# Patient Record
Sex: Female | Born: 1940 | Race: Black or African American | Hispanic: No | Smoking: Current some day smoker
Health system: Southern US, Community
[De-identification: ages and names within clinical notes are randomized; demographics above are authoritative.]

## PROBLEM LIST (undated history)

## (undated) DIAGNOSIS — R918 Other nonspecific abnormal finding of lung field: Secondary | ICD-10-CM

## (undated) DIAGNOSIS — M8700 Idiopathic aseptic necrosis of unspecified bone: Secondary | ICD-10-CM

## (undated) DIAGNOSIS — F419 Anxiety disorder, unspecified: Secondary | ICD-10-CM

## (undated) DIAGNOSIS — T7840XA Allergy, unspecified, initial encounter: Secondary | ICD-10-CM

## (undated) DIAGNOSIS — J449 Chronic obstructive pulmonary disease, unspecified: Secondary | ICD-10-CM

## (undated) DIAGNOSIS — K3189 Other diseases of stomach and duodenum: Secondary | ICD-10-CM

## (undated) DIAGNOSIS — E039 Hypothyroidism, unspecified: Secondary | ICD-10-CM

## (undated) DIAGNOSIS — S065X9A Traumatic subdural hemorrhage with loss of consciousness of unspecified duration, initial encounter: Secondary | ICD-10-CM

## (undated) DIAGNOSIS — J189 Pneumonia, unspecified organism: Secondary | ICD-10-CM

## (undated) DIAGNOSIS — M1990 Unspecified osteoarthritis, unspecified site: Secondary | ICD-10-CM

## (undated) DIAGNOSIS — I4892 Unspecified atrial flutter: Secondary | ICD-10-CM

## (undated) DIAGNOSIS — D649 Anemia, unspecified: Secondary | ICD-10-CM

## (undated) DIAGNOSIS — E785 Hyperlipidemia, unspecified: Secondary | ICD-10-CM

## (undated) DIAGNOSIS — K635 Polyp of colon: Secondary | ICD-10-CM

## (undated) HISTORY — PX: COLONOSCOPY: SHX174

## (undated) HISTORY — DX: Idiopathic aseptic necrosis of unspecified bone: M87.00

## (undated) HISTORY — DX: Allergy, unspecified, initial encounter: T78.40XA

## (undated) HISTORY — DX: Hyperlipidemia, unspecified: E78.5

## (undated) HISTORY — DX: Chronic obstructive pulmonary disease, unspecified: J44.9

## (undated) HISTORY — DX: Polyp of colon: K63.5

## (undated) HISTORY — DX: Anemia, unspecified: D64.9

## (undated) HISTORY — PX: DILATION AND CURETTAGE OF UTERUS: SHX78

## (undated) HISTORY — DX: Hypothyroidism, unspecified: E03.9

## (undated) HISTORY — PX: TUBAL LIGATION: SHX77

## (undated) HISTORY — DX: Anxiety disorder, unspecified: F41.9

---

## 1998-11-12 ENCOUNTER — Emergency Department (HOSPITAL_COMMUNITY): Admission: EM | Admit: 1998-11-12 | Discharge: 1998-11-12 | Payer: Self-pay | Admitting: Emergency Medicine

## 2000-03-06 HISTORY — PX: TOTAL HIP ARTHROPLASTY: SHX124

## 2000-10-05 ENCOUNTER — Encounter: Payer: Self-pay | Admitting: Orthopedic Surgery

## 2000-10-09 ENCOUNTER — Encounter: Payer: Self-pay | Admitting: Orthopedic Surgery

## 2000-10-09 ENCOUNTER — Inpatient Hospital Stay (HOSPITAL_COMMUNITY): Admission: RE | Admit: 2000-10-09 | Discharge: 2000-10-15 | Payer: Self-pay | Admitting: Orthopedic Surgery

## 2000-10-11 ENCOUNTER — Encounter: Payer: Self-pay | Admitting: Orthopedic Surgery

## 2003-08-26 ENCOUNTER — Encounter (HOSPITAL_COMMUNITY): Admission: RE | Admit: 2003-08-26 | Discharge: 2003-08-27 | Payer: Self-pay | Admitting: Family Medicine

## 2005-01-06 ENCOUNTER — Encounter: Admission: RE | Admit: 2005-01-06 | Discharge: 2005-01-06 | Payer: Self-pay | Admitting: Family Medicine

## 2005-01-17 ENCOUNTER — Ambulatory Visit (HOSPITAL_COMMUNITY): Admission: RE | Admit: 2005-01-17 | Discharge: 2005-01-17 | Payer: Self-pay | Admitting: Gastroenterology

## 2005-01-17 ENCOUNTER — Encounter (INDEPENDENT_AMBULATORY_CARE_PROVIDER_SITE_OTHER): Payer: Self-pay | Admitting: *Deleted

## 2005-01-20 ENCOUNTER — Encounter: Admission: RE | Admit: 2005-01-20 | Discharge: 2005-01-20 | Payer: Self-pay | Admitting: Family Medicine

## 2007-01-07 ENCOUNTER — Encounter: Admission: RE | Admit: 2007-01-07 | Discharge: 2007-01-07 | Payer: Self-pay | Admitting: Family Medicine

## 2010-02-10 ENCOUNTER — Ambulatory Visit: Payer: Self-pay | Admitting: Cardiology

## 2010-03-27 ENCOUNTER — Encounter: Payer: Self-pay | Admitting: Family Medicine

## 2010-07-22 NOTE — Discharge Summary (Signed)
[place]. [facility]  Patient:    Emma Douglas, Emma Douglas Visit Number: [id number] MRN: [id number]          Service Type: SUR Location: [id number] Attending Physician:  Marlowe Kays Page Dictated by:   Alexzandrew L. Perkins, P.A.-C. Adm. Date:  10/09/2000 Disc. Date: 10/15/2000                             Discharge Summary  ADMISSION DIAGNOSES: 1. Avascular necrosis right femoral head. 2. Hypothyroidism. 3. Anxiety. 4. Hyperlipidemia. 5. Chronic obstructive pulmonary disease. 6. Smoking history.  DISCHARGE DIAGNOSES: 1. Avascular necrosis with collapsed right femoral head, status post right    total hip replacement arthroplasty. 2. Postoperative hemorrhagic anemia. 3. Status post transfusion without sequelae. 4. Hypothyroidism. 5. Anxiety. 6. Hyperlipidemia. 7. Chronic obstructive pulmonary disease. 8. Smoking history.  PROCEDURES:  The patient was taken to the OR on October 09, 2000, and underwent a right total hip replacement arthroplasty.  Surgeon:  Dr. Marlowe Kays. Assistant:  Dr. Ronnell Guadalajara.  Anesthesia:  General.  CONSULTATIONS:  Rehabilitation services, Dr. Johna Roles.  HISTORY OF PRESENT ILLNESS:  The patient is a 70 year old female who has been having increasing right hip pain for some time now.  The pain has been increasing over the past five years and has started to interfere with her daily activities.  She has been referred to Dr. Dawayne Cirri office by Dr. Alcario Drought of [facility] for consideration of surgery.  The patient was seen and evaluated and found to have avascular necrosis of the right femoral head.  It was felt she would benefit from undergoing total hip replacement.  Risks and benefits discussed.  She was subsequently admitted to the hospital.  LABORATORY DATA:  CBC on admission:  Hemoglobin 11.3, hematocrit 33.6, white cell count 12.2, red cell count 3.59.  Differential:  Neutrophils 57,  lymphs 40, monos 1, eos 0, basos 0.  Serial H&Hs were followed throughout the hospital course.  Postoperative hemoglobin down to 10.4, hematocrit of 30.4. Continued to decline.  Hemoglobin crept down to a level of 7.8.  Transfused two units.  Posttransfusion hemoglobin 10.2, hematocrit of 29.9.  PT and PTT on admission were 12.8 and 35 respectively.  INR of 1.0.  Serial protimes followed per Coumadin protocol.  Last noted PT/INR of 20.7 and 2.2 respectively.  Chemistry panel on admission:  Slightly elevated ALP of 118, remaining chemistry panel all within normal limits.  Urinalysis on admission showed many epithelial cells with few bacteria and 7-10 white cells and 0-10 red cells.  Follow-up UA showed only few epithelial cells and few bacteria with 7-10 white cells and 36 red cells.  Blood group type O positive.  EKG dated October 05, 2000:  Normal sinus rhythm.  Nonspecific T-wave abnormalities.  No old tracings.  Confirmed by Dr. Viann Fish, Montez Hageman.  X-rays dated October 09, 2000:  Satisfactory appearance following right total hip replacement.  HOSPITAL COURSE:  The patient was admitted to [facility] and taken to the OR.  She underwent the above-stated procedure without complication.  The patient tolerated the procedure well.  Later was sent to the recovery room and then to the orthopedic floor for continued postoperative care.  The patient was placed on PCA analgesia for pain control following surgery.  She did have elevated temperatures postoperatively and was noted to be 101.1 on postoperative day #1.  She was treated with antibiotics, incentive  spirometer. She had a history of COPD, and incentive spirometer was encouraged.  She did have some productive cough noted on postoperative day #2.  A chest x-ray was ordered, which was recorded as negative (I did not see a chest x-ray report on the chart).  She continued to be followed postoperatively.  Her hemoglobin was watched very  closely.  It was noted to have dropped postoperatively down to a level of 7.8.  She was transfused with two units of blood and also underwent some mild diuresis.  She tolerated the blood very well.  Hemoglobin improved up to 10.2 by postoperative day #4.  She was seen and evaluated by rehabilitation services postoperatively, and it was felt she would benefit from undergoing inpatient rehabilitation stay.  Throughout the course she did slowly progress with physical therapy and, after further discussion with Dr. Simonne Come, the patient decided that she would rather go home.  She continued to work with therapy up until postoperative day #6, where she was doing quite well, ambulating more independently, and she was ready to go home. The patient was, therefore, discharged on October 15, 2000.  DISCHARGE PLAN: 1. Patient discharged home on October 15, 2000. 2. Discharge diagnoses:  Same as above. 3. Discharge medications:    a. Coumadin as per outpatient protocol.    b. Percocet p.r.n. pain.    c. Robaxin p.r.n. spasm. 4. Diet:  As tolerated. 5. Activity:  Touchdown weightbearing to the operative leg.  Hip precautions    at all times. 6. Follow-up:  Two weeks from the date of surgery with Dr. Simonne Come.  CONDITION ON DISCHARGE:  Improved. Dictated by:   Alexzandrew L. Perkins, P.A.-C. Attending Physician:  Joaquin Courts DD:  10/26/00 TD:  10/28/00 Job: [id number]

## 2010-07-22 NOTE — H&P (Signed)
[place]. [facility]  Patient:    Emma Douglas, Emma Douglas                     MRN: [id number] Adm. Date:  10/09/00 Attending:  Fayrene Fearing P. Aplington, M.D. Dictator:   Colleen P. Mahar, P.A.                         History and Physical  CHIEF COMPLAINT: Right hip pain.  HISTORY OF PRESENT ILLNESS: The patient is having increasing right hip pain and groin pain.  She is having increasing problems over the last five years, recently increasing and interfering with her activities of daily living and quality of life.  She has been referred to our clinic from Dr. Elvina Sidle at [facility].  The risks and benefits of surgery were discussed with this patient by Dr. Simonne Come as well as myself and she decided to proceed with a right total hip replacement.  PAST MEDICAL HISTORY:  1. Hypothyroidism.  2. Anxiety.  3. Hyperlipidemia, secondary to hypothyroidism and resolving.  4. COPD secondary to smoking.  MEDICATIONS:  1. Synthroid 100 mcg one q.d.  2. Bextra 10 mg one q.d.  ALLERGIES: No known drug allergies.  PAST SURGICAL HISTORY: Ankle fracture in 1996 with open reduction and internal fixation.  SOCIAL HISTORY: The patient smokes approximately 1/2 packs of cigarettes per day.  She denies any alcohol use.  She is divorced and lives alone.  FAMILY HISTORY: Mother deceased in her 33s secondary to an MI.  Father deceased in his 64s secondary to an MI.  One siblings deceased in his 24s secondary to lung cancer.  REVIEW OF SYSTEMS: GENERAL: The patient denies any fever, chills, night sweats, or bleeding tendencies.  CNS: Denies blurred vision, double vision, headaches, seizures, or paralysis.  PULMONARY: Denies shortness of breath, productive cough, or hemoptysis.  CARDIOVASCULAR: Denies chest pain, angina, orthopnea, or claudication.  GI: Denies nausea, vomiting, constipation, diarrhea, melena, or bloody stool.  GU: Denies dysuria, hematuria,  or discharge.  MUSCULOSKELETAL: As per HPI.  PHYSICAL EXAMINATION:  VITAL SIGNS: Blood pressure 130/66, respirations 14 and unlabored, pulse 80 and regular.  GENERAL APPEARANCE: The patient is a 70 year old female who is alert and oriented and in no acute distress.  She is pleasant and cooperative throughout the examination.  HEENT: Head normocephalic, atraumatic.  EOMI.  NECK: Soft and supple to palpation.  No thyromegaly noted.  No bruits appreciated.  CHEST: Clear to auscultation bilaterally anterior and posterior.  No rales, rhonchi, wheezes, stridor, or friction rub appreciated.  BREAST: Not pertinent, not performed.  HEART: Regular rate and rhythm.  There was a murmur appreciated, 1-2/6 holosystolic murmur.  No gallops or rubs appreciated.  The patient denied any known history of murmur.  ABDOMEN: Soft and supple to palpation.  Positive bowel sounds throughout. Nontender, nondistended.  No organomegaly noted.  GU: Not pertinent, not performed.  EXTREMITIES: She walks with a pronounced limp.  She complains of significant groin and hip pain.  Motion is limited secondary to pain.  Strength, sensation, and circulation are intact to bilateral lower extremities.  Knee and ankle reflexes are 1+ bilaterally and equal.  SKIN: Intact without any rashes or lesions.  LABORATORY DATA: X-rays reveal collapse of the right femoral head with findings consistent with avascular necrosis.  IMPRESSION:  1. Avascular necrosis of the right femoral head.  2. Hypothyroidism.  3. Anxiety.  4. Chronic obstructive pulmonary disease  secondary to smoking.  PLAN: Admit to [facility] [facility] for right total hip replacement by Dr. Marlowe Kays on October 09, 2000.  The patients primary medical physician is Dr. Milus Glazier at [facility] and he had been contacted for work-up of the heart murmur to confirm that it had been previously noted.  He wanted to see the  patient prior to clearing her for surgery and she was seen in his office on October 04, 2000, at which time he wanted to further work up this murmur prior to surgery.  She is currently scheduled for an echocardiogram and medical clearance will be pending the results of that. DD:  10/04/00 TD:  10/05/00 Job: [id number]

## 2010-07-22 NOTE — Op Note (Signed)
[place]. [facility]  Patient:    Emma Douglas, Emma Douglas                   MRN: [id number] Proc. Date: 10/09/00 Adm. Date:  [id number] Attending:  Marlowe Kays Page                           Operative Report  PREOPERATIVE DIAGNOSIS:  Avascular necrosis with collapse right femoral head.  POSTOPERATIVE DIAGNOSIS:  Avascular necrosis with collapse right femoral head.  OPERATION:  Osteonics total hip arthroplasty right.  SURGEON:  Illene Labrador. Aplington, M.D.  ASSISTANT:  Philips J. Montez Morita, M.D.  ANESTHESIA:  General  INDICATION FOR PROCEDURE:  As stated in diagnosis she was having significant amount of pain.  DESCRIPTION OF PROCEDURE:  Prophylactic antibiotics.  Satisfactory general anesthesia.  Foley catheter inserted.  Left lateral decubitus position using the Mark II frame. The right hip was prepped with DuraPrep and draped in a sterile field.  Ioban employed. Posterior lateral incision down to the fascia lata.  Zickels band was cut.  External rotators were detached from the femur with cutting cautery. The gluteus medius was retracted and the piriformis cut, tagged with a #1 Ethibon and retracted.  The hip capsule was identified and opened in T fashion and the flaps retracted with #1 Ethibon.  The hip was then dislocated and the piriformis fossa were cleared of soft tissue.  I made my initial femoral neck cut just below the femoral head and placed a guide pin down the piriformis fossa and femur drilling over it with step cut drill, followed by the canal finder.  I then began the cylinder reaming process initially up to a 9.  Along the way used the greater trochanteric reamer and also made my final cut on the femoral neck a fingerbreadth above the lesser trochanter using the guide.  At #9 which is what we templated, then began the rasping process but found that the 10 rasp was probably going to be the appropriate size and accordingly I went back to a #9  and did a little bit more reaming with cylindrical reamer followed by a 10 and then went back to the 10 rasp which indeed did seem to be the ideal fit for this woman.  At age 79 I elected to go with the noncemented femoral component.  I then reamed from the distal femoral tip up to 14-1/2 to accept a 14 mm tip.  I then went to the acetabulum where I removed the labrum and cleared the fossa of soft tissue.  I then introduced 4-3 reamer to level off the base of the acetabulum and then began with expanding reamers working finally up to a 52 which had good peripheral fit.  I then went through a trial reduction and the hip was found to be nice and stable with the composition as I placed it.  Accordingly went ahead and placed a final Omni fit PSL microstructured acetabular shell tightly impacting it and then placing two fixation screws with each one being checked with a depth gauge measured and started.  Then went through another trial reduction and the hip seemed to be nicely stable with a 10 degree liner at roughly 12:00.  Accordingly went ahead and placed the final polyethylene liner and went back to the femur where I irrigated well with antibiotic solution and placed the final primary secure fit plus hip #10 with 14 mm tip impacting  it gently.  I then went through a trial reduction once again with +5 neck being a little long and +0 seemed to be the ideal fit with excellent stability. Accordingly I placed the +0 C tapered head, reduced the hip and found to be nicely stable.  The wound was irrigated with sterile saline.  Zickels band was reconstituted with interrupted #1 Vicryl as was the capsule.  External rotators were reattached with #1 Vicryl as well and piriformis with #1 Ethibon.  Fascia lata was then reapproximated using interrupted #1 Vicryl and the subcutaneous tissue with a combination of #1 deep and 2-0 Vicryl superficially with staples in the skin.  Betadine Adaptic dry sterile  dressing were applied.  She was gently placed on her back in an abduction pillow.  Leg lengths were roughly equal and the toes were up. She was taken to the recovery room in satisfactory condition with no known complications.  Estimated blood loss was approximately 500 cc.  No blood replacement. DD:  10/09/00 TD:  10/09/00 Job: [id number]

## 2010-07-22 NOTE — Op Note (Signed)
NAME:  Emma Douglas, Emma Douglas NO.:  [id number]   MEDICAL RECORD NO.:  [id number]          PATIENT TYPE:  AMB   LOCATION:  ENDO                         FACILITY:  [facility]   PHYSICIAN:  Graylin Shiver, M.D.   DATE OF BIRTH:  [birth year]   DATE OF PROCEDURE:  01/17/2005  DATE OF DISCHARGE:                                 OPERATIVE REPORT   PROCEDURE:  Colonoscopy with polypectomy and biopsy.   INDICATIONS FOR PROCEDURE:  Screening.   Informed consent was obtained after explanation of the risks of bleeding,  infection and perforation.   PREMEDICATION:  Fentanyl 62.5 mcg IV, Versed 6 mg IV.   DESCRIPTION OF PROCEDURE:  With the patient in the left lateral decubitus  position, a rectal exam was performed, no masses were felt. The Olympus  colonoscope was inserted into the rectum and advanced around a tortuous  colon to the cecum. Upon advancement of the scope, I did see a 5 mm polyp in  the descending colon which was sessile and removed by snare cautery  technique. Also upon advancement, there was a 3 mm polyp in the transverse  colon which was biopsied off with cold forceps. In the cecum, the ileocecal  valve and appendiceal orifice area appeared normal, the ascending colon  looked normal. There were no other abnormalities noted in the transverse  colon other than the previously removed polyp. There were no other  abnormalities noted in the descending colon other than the previously  removed polyp. The sigmoid looked normal. In the rectum, there was a 4 mm  sessile polyp removed by snare cautery technique. The patient tolerated the  procedure well without complications.   IMPRESSION:  Colon polyps as described above.   PLAN:  The pathology will be checked.           ______________________________  Graylin Shiver, M.D.     SFG/MEDQ  D:  01/17/2005  T:  01/17/2005  Job:  [id number]

## 2010-08-31 ENCOUNTER — Encounter: Payer: Self-pay | Admitting: Cardiology

## 2010-09-05 ENCOUNTER — Other Ambulatory Visit: Payer: Self-pay | Admitting: *Deleted

## 2010-09-05 DIAGNOSIS — E039 Hypothyroidism, unspecified: Secondary | ICD-10-CM

## 2010-09-05 MED ORDER — LEVOTHYROXINE SODIUM 100 MCG PO TABS: 100.0000 ug | ORAL_TABLET | Freq: Every day | ORAL | Status: DC

## 2010-09-05 NOTE — Telephone Encounter (Signed)
Refilled meds per fax request.  

## 2010-09-26 ENCOUNTER — Other Ambulatory Visit: Payer: Self-pay | Admitting: Cardiology

## 2010-09-26 DIAGNOSIS — E785 Hyperlipidemia, unspecified: Secondary | ICD-10-CM

## 2010-09-27 NOTE — Telephone Encounter (Signed)
escribe request  

## 2010-11-23 ENCOUNTER — Other Ambulatory Visit: Payer: Self-pay | Admitting: Cardiology

## 2010-11-25 ENCOUNTER — Other Ambulatory Visit: Payer: Self-pay | Admitting: *Deleted

## 2010-11-25 DIAGNOSIS — E785 Hyperlipidemia, unspecified: Secondary | ICD-10-CM

## 2010-11-25 MED ORDER — SIMVASTATIN 40 MG PO TABS: 40.0000 mg | ORAL_TABLET | Freq: Every day | ORAL | Status: DC

## 2010-11-25 NOTE — Telephone Encounter (Signed)
Fax received from pharmacy. Refill completed. Jodette Mazel Villela RN  

## 2010-12-14 ENCOUNTER — Ambulatory Visit: Payer: Self-pay | Admitting: Cardiology

## 2011-01-13 ENCOUNTER — Encounter: Payer: Self-pay | Admitting: Cardiology

## 2011-01-13 ENCOUNTER — Other Ambulatory Visit: Payer: Self-pay | Admitting: *Deleted

## 2011-01-13 ENCOUNTER — Ambulatory Visit (INDEPENDENT_AMBULATORY_CARE_PROVIDER_SITE_OTHER): Payer: Medicaid Other | Admitting: Cardiology

## 2011-01-13 VITALS — BP 132/74 | HR 68 | Ht 68.0 in | Wt 146.1 lb

## 2011-01-13 DIAGNOSIS — I359 Nonrheumatic aortic valve disorder, unspecified: Secondary | ICD-10-CM

## 2011-01-13 DIAGNOSIS — I119 Hypertensive heart disease without heart failure: Secondary | ICD-10-CM

## 2011-01-13 DIAGNOSIS — I358 Other nonrheumatic aortic valve disorders: Secondary | ICD-10-CM

## 2011-01-13 DIAGNOSIS — E7800 Pure hypercholesterolemia, unspecified: Secondary | ICD-10-CM

## 2011-01-13 DIAGNOSIS — E039 Hypothyroidism, unspecified: Secondary | ICD-10-CM

## 2011-01-13 NOTE — Progress Notes (Signed)
Emma Douglas Date of Birth:  1940/11/16 [facility] [address]   [address] [place], [place]  [address] [phone]           Fax   [phone]  HPI: This pleasant 70 year old woman is seen for a scheduled followup office visit.  She has a past history of hypercholesterolemia essential hypertension and hypothyroidism.  She has a remote history of a large pericardial effusion which occurred when she was markedly hypothyroid.  The pericardial effusion resolved when her thyroid status returned to normal.  Her last echocardiogram was 08/10/09 and showed normal systolic function with ejection fraction 55-60% and with impaired relaxation.  She has mild aortic sclerosis and mild mitral regurgitation and normal pulmonary artery pressure.  Current Outpatient Prescriptions  Medication Sig Dispense Refill  . levothyroxine (SYNTHROID) 100 MCG tablet Take 1 tablet (100 mcg total) by mouth daily.  30 tablet  5  . lisinopril-hydrochlorothiazide (PRINZIDE,ZESTORETIC) 20-12.5 MG per tablet Take 1 tablet by mouth daily.        . simvastatin (ZOCOR) 40 MG tablet Take 1 tablet (40 mg total) by mouth at bedtime.  30 tablet  1    No Known Allergies  Patient Active Problem List  Diagnoses  . Benign hypertensive heart disease without heart failure    History  Smoking status  . Former Smoker  Smokeless tobacco  . Not on file    History  Alcohol Use     Family History  Problem Relation Age of Onset  . Heart attack Mother   . Heart attack Father     Review of Systems: The patient denies any heat or cold intolerance.  No weight gain or weight loss.  The patient denies headaches or blurry vision.  There is no cough or sputum production.  The patient denies dizziness.  There is no hematuria or hematochezia.  The patient denies any muscle aches or arthritis.  The patient denies any rash.  The patient denies frequent falling or instability.  There is no history of  depression or anxiety.  All other systems were reviewed and are negative.   Physical Exam: Filed Vitals:   01/13/11 1510  BP: 132/74  Pulse: 68      Assessment / Plan:

## 2011-01-13 NOTE — Patient Instructions (Addendum)
Your physician recommends that you continue on your current medications as directed. Please refer to the Current Medication list given to you today.  Your physician wants you to follow-up in: 6 months. You will receive a reminder letter in the mail two months in advance. If you don't receive a letter, please call our office to schedule the follow-up appointment.  

## 2011-01-13 NOTE — Assessment & Plan Note (Signed)
The patient is clinically euthyroid on her current therapy

## 2011-01-13 NOTE — Assessment & Plan Note (Signed)
The patient is tolerating her current antihypertensive regimen.  No side effects from her ACE inhibitor.

## 2011-01-13 NOTE — Assessment & Plan Note (Signed)
The patient is on simvastatin.  She is not having any side effects from statin therapy.  Plan to check her lipids at her next visit.

## 2011-01-18 ENCOUNTER — Other Ambulatory Visit: Payer: Self-pay | Admitting: Gastroenterology

## 2011-02-10 ENCOUNTER — Other Ambulatory Visit: Payer: Self-pay | Admitting: Cardiology

## 2011-02-10 NOTE — Telephone Encounter (Signed)
Refilled simvastatin.

## 2011-03-06 ENCOUNTER — Other Ambulatory Visit: Payer: Self-pay | Admitting: Cardiology

## 2011-03-06 MED ORDER — SIMVASTATIN 40 MG PO TABS: 40.0000 mg | ORAL_TABLET | Freq: Every day | ORAL | Status: DC

## 2011-03-07 DIAGNOSIS — S065XAA Traumatic subdural hemorrhage with loss of consciousness status unknown, initial encounter: Secondary | ICD-10-CM

## 2011-03-07 DIAGNOSIS — S065X9A Traumatic subdural hemorrhage with loss of consciousness of unspecified duration, initial encounter: Secondary | ICD-10-CM

## 2011-03-07 HISTORY — DX: Traumatic subdural hemorrhage with loss of consciousness status unknown, initial encounter: S06.5XAA

## 2011-03-07 HISTORY — DX: Traumatic subdural hemorrhage with loss of consciousness of unspecified duration, initial encounter: S06.5X9A

## 2011-03-10 ENCOUNTER — Other Ambulatory Visit: Payer: Self-pay | Admitting: *Deleted

## 2011-03-10 DIAGNOSIS — E039 Hypothyroidism, unspecified: Secondary | ICD-10-CM

## 2011-03-10 MED ORDER — LEVOTHYROXINE SODIUM 100 MCG PO TABS: 100.0000 ug | ORAL_TABLET | Freq: Every day | ORAL | Status: DC

## 2011-03-10 NOTE — Telephone Encounter (Signed)
Refilled synthroid

## 2011-06-05 ENCOUNTER — Other Ambulatory Visit: Payer: Self-pay | Admitting: Cardiology

## 2011-06-05 NOTE — Telephone Encounter (Signed)
..   Requested Prescriptions   Pending Prescriptions Disp Refills  . lisinopril-hydrochlorothiazide (PRINZIDE,ZESTORETIC) 20-12.5 MG per tablet [Pharmacy Med Name: LISINOPRIL-HCTZ 20-12.5 MG TAB] 30 tablet 3    Sig: TAKE 1 TABLET EVERY DAY

## 2011-09-04 ENCOUNTER — Other Ambulatory Visit: Payer: Self-pay | Admitting: Cardiology

## 2011-09-11 ENCOUNTER — Other Ambulatory Visit: Payer: Self-pay | Admitting: *Deleted

## 2011-09-11 MED ORDER — SIMVASTATIN 40 MG PO TABS: 40.0000 mg | ORAL_TABLET | Freq: Every day | ORAL | Status: DC

## 2011-09-11 MED ORDER — LEVOTHYROXINE SODIUM 100 MCG PO TABS: 100.0000 ug | ORAL_TABLET | Freq: Every day | ORAL | Status: DC

## 2011-09-11 MED ORDER — LISINOPRIL-HYDROCHLOROTHIAZIDE 20-12.5 MG PO TABS: 1.0000 | ORAL_TABLET | Freq: Every day | ORAL | Status: DC

## 2011-09-11 NOTE — Telephone Encounter (Signed)
Refills on synthroid,simvastatin and lisinopril

## 2011-10-28 ENCOUNTER — Encounter (HOSPITAL_COMMUNITY): Payer: Self-pay | Admitting: Physical Medicine and Rehabilitation

## 2011-10-28 ENCOUNTER — Emergency Department (HOSPITAL_COMMUNITY): Payer: Medicare Other

## 2011-10-28 ENCOUNTER — Inpatient Hospital Stay (HOSPITAL_COMMUNITY)
Admission: EM | Admit: 2011-10-28 | Discharge: 2011-11-01 | DRG: 085 | Disposition: A | Discharge status: Home / Self Care | Payer: Medicare Other | Attending: Internal Medicine | Admitting: Internal Medicine

## 2011-10-28 DIAGNOSIS — E039 Hypothyroidism, unspecified: Secondary | ICD-10-CM

## 2011-10-28 DIAGNOSIS — E46 Unspecified protein-calorie malnutrition: Secondary | ICD-10-CM | POA: Diagnosis present

## 2011-10-28 DIAGNOSIS — R578 Other shock: Secondary | ICD-10-CM | POA: Diagnosis present

## 2011-10-28 DIAGNOSIS — G9340 Encephalopathy, unspecified: Secondary | ICD-10-CM | POA: Diagnosis present

## 2011-10-28 DIAGNOSIS — I119 Hypertensive heart disease without heart failure: Secondary | ICD-10-CM

## 2011-10-28 DIAGNOSIS — J4489 Other specified chronic obstructive pulmonary disease: Secondary | ICD-10-CM | POA: Diagnosis present

## 2011-10-28 DIAGNOSIS — S065XAA Traumatic subdural hemorrhage with loss of consciousness status unknown, initial encounter: Secondary | ICD-10-CM

## 2011-10-28 DIAGNOSIS — E876 Hypokalemia: Secondary | ICD-10-CM

## 2011-10-28 DIAGNOSIS — I6203 Nontraumatic chronic subdural hemorrhage: Secondary | ICD-10-CM

## 2011-10-28 DIAGNOSIS — S065X9A Traumatic subdural hemorrhage with loss of consciousness of unspecified duration, initial encounter: Secondary | ICD-10-CM

## 2011-10-28 DIAGNOSIS — I6200 Nontraumatic subdural hemorrhage, unspecified: Secondary | ICD-10-CM

## 2011-10-28 DIAGNOSIS — E871 Hypo-osmolality and hyponatremia: Secondary | ICD-10-CM

## 2011-10-28 DIAGNOSIS — S065X0A Traumatic subdural hemorrhage without loss of consciousness, initial encounter: Principal | ICD-10-CM | POA: Diagnosis present

## 2011-10-28 DIAGNOSIS — J449 Chronic obstructive pulmonary disease, unspecified: Secondary | ICD-10-CM | POA: Diagnosis present

## 2011-10-28 DIAGNOSIS — Z720 Tobacco use: Secondary | ICD-10-CM

## 2011-10-28 DIAGNOSIS — N179 Acute kidney failure, unspecified: Secondary | ICD-10-CM

## 2011-10-28 DIAGNOSIS — I609 Nontraumatic subarachnoid hemorrhage, unspecified: Secondary | ICD-10-CM

## 2011-10-28 DIAGNOSIS — E860 Dehydration: Secondary | ICD-10-CM

## 2011-10-28 DIAGNOSIS — E7800 Pure hypercholesterolemia, unspecified: Secondary | ICD-10-CM

## 2011-10-28 DIAGNOSIS — D72829 Elevated white blood cell count, unspecified: Secondary | ICD-10-CM | POA: Diagnosis present

## 2011-10-28 DIAGNOSIS — N289 Disorder of kidney and ureter, unspecified: Secondary | ICD-10-CM

## 2011-10-28 DIAGNOSIS — I959 Hypotension, unspecified: Secondary | ICD-10-CM

## 2011-10-28 DIAGNOSIS — W19XXXA Unspecified fall, initial encounter: Secondary | ICD-10-CM | POA: Diagnosis present

## 2011-10-28 DIAGNOSIS — R9431 Abnormal electrocardiogram [ECG] [EKG]: Secondary | ICD-10-CM

## 2011-10-28 LAB — BASIC METABOLIC PANEL
BUN: 26 mg/dL — ABNORMAL HIGH (ref 6–23)
BUN: 23 mg/dL (ref 6–23)
CO2: 25 mEq/L (ref 19–32)
Calcium: 9.8 mg/dL (ref 8.4–10.5)
Chloride: 73 mEq/L — ABNORMAL LOW (ref 96–112)
Chloride: 77 mEq/L — ABNORMAL LOW (ref 96–112)
Creatinine, Ser: 1.39 mg/dL — ABNORMAL HIGH (ref 0.50–1.10)
GFR calc Af Amer: 43 mL/min — ABNORMAL LOW (ref >90)
GFR calc Af Amer: 52 mL/min — ABNORMAL LOW (ref >90)
GFR calc non Af Amer: 37 mL/min — ABNORMAL LOW (ref >90)
GFR calc non Af Amer: 45 mL/min — ABNORMAL LOW (ref >90)
Glucose, Bld: 112 mg/dL — ABNORMAL HIGH (ref 70–99)
Potassium: 3 mEq/L — ABNORMAL LOW (ref 3.5–5.1)
Potassium: 3.2 mEq/L — ABNORMAL LOW (ref 3.5–5.1)
Sodium: 112 mEq/L — CL (ref 135–145)
Sodium: 116 mEq/L — CL (ref 135–145)

## 2011-10-28 LAB — URINALYSIS, ROUTINE W REFLEX MICROSCOPIC
Bilirubin Urine: NEGATIVE
Glucose, UA: NEGATIVE mg/dL
Hgb urine dipstick: NEGATIVE
Ketones, ur: NEGATIVE mg/dL
Leukocytes, UA: NEGATIVE
Nitrite: NEGATIVE
Protein, ur: NEGATIVE mg/dL
Specific Gravity, Urine: 1.008 (ref 1.005–1.030)
Urobilinogen, UA: 1 mg/dL (ref 0.0–1.0)
pH: 6 (ref 5.0–8.0)

## 2011-10-28 LAB — PROTIME-INR
INR: 0.91 (ref 0.00–1.49)
Prothrombin Time: 12.5 seconds (ref 11.6–15.2)

## 2011-10-28 LAB — CBC
HCT: 40.2 % (ref 36.0–46.0)
Hemoglobin: 15.4 g/dL — ABNORMAL HIGH (ref 12.0–15.0)
MCH: 34.8 pg — ABNORMAL HIGH (ref 26.0–34.0)
MCHC: 38.3 g/dL — ABNORMAL HIGH (ref 30.0–36.0)
MCV: 91 fL (ref 78.0–100.0)
Platelets: 242 10*3/uL (ref 150–400)
RBC: 4.42 MIL/uL (ref 3.87–5.11)
RDW: 12.6 % (ref 11.5–15.5)
WBC: 10.6 10*3/uL — ABNORMAL HIGH (ref 4.0–10.5)

## 2011-10-28 LAB — TSH: TSH: 1.1 u[IU]/mL (ref 0.350–4.500)

## 2011-10-28 LAB — TROPONIN I: Troponin I: <0.3 ng/mL (ref <0.30)

## 2011-10-28 LAB — OSMOLALITY, URINE: Osmolality, Ur: 209 mOsm/kg — ABNORMAL LOW (ref 390–1090)

## 2011-10-28 LAB — MRSA PCR SCREENING: MRSA by PCR: POSITIVE — AB

## 2011-10-28 LAB — SODIUM, URINE, RANDOM: Sodium, Ur: <10 mEq/L

## 2011-10-28 MED ORDER — SODIUM CHLORIDE 0.9 % IV BOLUS (SEPSIS)
1000.0000 mL | Freq: Once | INTRAVENOUS | Status: AC
  Administered 2011-10-28: 1000 mL via INTRAVENOUS

## 2011-10-28 MED ORDER — MUPIROCIN 2 % EX OINT
1.0000 "application " | TOPICAL_OINTMENT | Freq: Two times a day (BID) | CUTANEOUS | Status: DC
  Administered 2011-10-28 – 2011-11-01 (×7): 1 via NASAL
  Filled 2011-10-28: qty 22

## 2011-10-28 MED ORDER — SODIUM CHLORIDE 0.9 % IV SOLN
INTRAVENOUS | Status: DC
  Administered 2011-10-28 – 2011-10-31 (×4): via INTRAVENOUS

## 2011-10-28 MED ORDER — CHLORHEXIDINE GLUCONATE CLOTH 2 % EX PADS
6.0000 | MEDICATED_PAD | Freq: Every day | CUTANEOUS | Status: DC
  Administered 2011-10-29 – 2011-11-01 (×4): 6 via TOPICAL

## 2011-10-28 MED ORDER — LEVOTHYROXINE SODIUM 100 MCG PO TABS
100.0000 ug | ORAL_TABLET | Freq: Every day | ORAL | Status: DC
  Administered 2011-10-28 – 2011-11-01 (×5): 100 ug via ORAL
  Filled 2011-10-28 (×6): qty 1

## 2011-10-28 MED ORDER — POTASSIUM CHLORIDE 10 MEQ/100ML IV SOLN
10.0000 meq | Freq: Once | INTRAVENOUS | Status: DC
  Filled 2011-10-28: qty 100

## 2011-10-28 MED ORDER — POTASSIUM CHLORIDE 10 MEQ/100ML IV SOLN
10.0000 meq | INTRAVENOUS | Status: AC
  Administered 2011-10-28 (×4): 10 meq via INTRAVENOUS
  Filled 2011-10-28: qty 400

## 2011-10-28 MED ORDER — SODIUM CHLORIDE 0.9 % IV SOLN
INTRAVENOUS | Status: DC
  Filled 2011-10-28: qty 1000

## 2011-10-28 NOTE — ED Notes (Addendum)
Pt presents to department via [facility] for evaluation of altered mental status. Pt was found at home lying supine in the floor, noted to be unresponsive. People were at house, but unable to give info on patient condition. CBG 186. Upon arrival pt is alert, able to answer most questions correctly. Respirations unlabored. Denies pain. 20g RAC. LSB and c-collar in place upon arrival.

## 2011-10-28 NOTE — H&P (Signed)
Name: Emma Douglas MRN: [id number] DOB: [birth year]    LOS: 0  Referring Provider:  Juleen China Reason for Referral:  Altered mental status, traumatic SDH  PULMONARY / CRITICAL CARE MEDICINE  HPI:   This is a 71 year old female who presented to the ER on 8/24 after being found by her neighbor on the floor. Pt states had been having difficulty with balance in days leading up to presentation. Denied prior falls. She is a poor historian but does report poor po intake and weight loss but was unable to elaborate on how long that had been going on for. Denies LOC, seizures, chest pain, cough, SOB, nausea, vomiting, diarrhea, urinary incont.. Denies ETOH or drug intake, reports taking medications as prescribed.  ER evaluation demonstrated small bilateral SDH, marked Hyponatremia, and renal insufficiency. Her films were evaluated by Neuro-surgery. She was not felt to require surgical intervention.  PCCM was asked to admit.   Past Medical History  Diagnosis Date  . Compensated hypothyroidism   . Essential hypertension   . Dyslipidemia   . Colon polyps   . Anxiety   . COPD (chronic obstructive pulmonary disease)     secondary to smoking.  . Avascular necrosis     right femoral head  . Hyperlipidemia   . Smoking history   . Anemia      Postoperative hemorrhagic anemia   Past Surgical History  Procedure Date  . Colonoscopy     with polypectomy and biopsy  . Total hip arthroplasty    Prior to Admission medications   Medication Sig Start Date End Date Taking? Authorizing Provider  levothyroxine (SYNTHROID) 100 MCG tablet Take 1 tablet (100 mcg total) by mouth daily. 09/11/11  Yes Cassell Clement, MD  lisinopril-hydrochlorothiazide (PRINZIDE,ZESTORETIC) 20-12.5 MG per tablet Take 1 tablet by mouth daily. 09/11/11  Yes Cassell Clement, MD  simvastatin (ZOCOR) 40 MG tablet Take 1 tablet (40 mg total) by mouth at bedtime. 09/11/11  Yes Cassell Clement, MD   Allergies No Known Allergies  Family  History Family History  Problem Relation Age of Onset  . Heart attack Mother   . Heart attack Father    Social History  reports that she has been smoking Cigarettes.  She does not have any smokeless tobacco history on file. She reports that she does not drink alcohol. Her drug history not on file.  Review Of Systems:   As described above  Brief patient description:   71 year old female admitted 8/24 w/ traumatic bilateral SDH after a fall. States was essentially in usual state of health but has had some difficulty w/ balance in weeks prior to admit. Found to be hyponatremic w/ AKI on presentation. PCCM asked to admit.   Current Status: guarded  Vital Signs: Temp:  [97.7 F (36.5 C)] 97.7 F (36.5 C) (08/24 0800) Pulse Rate:  [58-94] 58  (08/24 1130) Resp:  [17-20] 17  (08/24 1130) BP: (72-122)/(25-85) 87/49 mmHg (08/24 1130) SpO2:  [95 %-100 %] 100 % (08/24 1130)  Physical Examination: General:  Chronically ill appearing AAF, poor skin turgor.  Neuro:  Sp slurred, oriented X 3. Moves all ext w/out focal def. No neck pain HEENT:  Mucous membranes moist. No JVD Cardiovascular:  rrr Lungs:  CTA Abdomen:  Soft non-tender Musculoskeletal:  intact Skin:  Multiple areas of ecchymosis.   Active Problems:  Hypothyroidism  Hyponatremia  Hypokalemia  Acute encephalopathy  Dehydration  Hypotension  Subdural hematoma, post-traumatic   ASSESSMENT AND PLAN  PULMONARY No  results found for this basename: PHART:5,PCO2:5,PCO2ART:5,PO2ART:5,HCO3:5,O2SAT:5 in the last 168 hours Ventilator Settings:   CXR: no acute infiltrates.  ETT:  None  A:  No acute issues. Currently protecting airway.  P:   Monitor in ICU pulm hygiene  NPO for now   CARDIOVASCULAR  Lab 10/28/11 0835  TROPONINI <0.30  LATICACIDVEN --  PROBNP --   ECG:   Lines:   A: Hypovolemia and hypovolemic shock.  Volume depletion and concomitant diuretic rx.   P:  Hold antihypertensives IVFs 0.9%   NaCl Admit to ICU  RENAL  Lab 10/28/11 0835  NA 112*  K 3.0*  CL 73*  CO2 25  BUN 26*  CREATININE 1.39*  CALCIUM 9.8  MG --  PHOS --   Intake/Output    None    Foley:  8/24  A:   1) Dehydration       2) hyponatremia       3) hypokalemia       4) hypochloremia       5) acute renal failure  P:   Hydrate w/ NS D/c HCTZ Send UNa and osmo Replace K Renal adjust meds F/u chemistry   GASTROINTESTINAL No results found for this basename: AST:5,ALT:5,ALKPHOS:5,BILITOT:5,PROT:5,ALBUMIN:5 in the last 168 hours  A:  Malnutrition  P:   Send pre-albumin  Dietary consult when she can take pos  HEMATOLOGIC  Lab 10/28/11 0943 10/28/11 0835  HGB -- 15.4*  HCT -- 40.2  PLT -- 242  INR 0.91 --  APTT -- --   A:  Hemoconcentration  P:  No need for PPI currently F/u CBC  INFECTIOUS  Lab 10/28/11 0835  WBC 10.6*  PROCALCITON --   Cultures: UC 8/24>>> Antibiotics: NONE   A:  Mild leukocytosis.  No clear evidence of infection  P:   Send UA  ENDOCRINE No results found for this basename: GLUCAP:5 in the last 168 hours A:  Hypothyroidism  P:   Send TSH Cont synthroid   NEUROLOGIC CT head 8/24: Acute/subacute left subdural hematoma and chronic right subdural  hematoma/hygroma, as described above.  CT C spine 8/24>>> CT head 8/25>>>small amount of Bilateral Subdural blood, no shift   A:  Acute encephalopathy      Traumatic bilateral SDH       Not surgical candidate .  P:   Send ETOH screen Send tox screen Admit to ICU F/u CT scan in 24 hr    BEST PRACTICE / DISPOSITION Level of Care:  icu Primary Service:  pccm  Consultants:  Neurosurgery eval in ED>>no surgery needed, call prn Code Status:  full Diet:  NPO except meds DVT Px:  PAS GI Px:  none Skin Integrity:  Multiple areas of ecchymosis  Social / Family:    Anders Simmonds, NP Pulmonary and Critical Care Medicine [facility] Pager: [phone]  CC59min Dorcas Carrow Beeper  [phone]  Cell  [phone]  If no response or cell goes to voicemail, call beeper [phone]   10/28/2011, 11:53 AM

## 2011-10-28 NOTE — Consult Note (Signed)
Reason for Consult:FALL Referring Physician: Camryn Lampson is an 71 y.o. female.  HPI: Asked to see pt due to fall.  She has no recollection of falling.  Denies pain currently.  She has severe hyponatremia.    Past Medical History  Diagnosis Date  . Compensated hypothyroidism   . Essential hypertension   . Dyslipidemia   . Colon polyps   . Anxiety   . COPD (chronic obstructive pulmonary disease)     secondary to smoking.  . Avascular necrosis     right femoral head  . Hyperlipidemia   . Smoking history   . Anemia      Postoperative hemorrhagic anemia    Past Surgical History  Procedure Date  . Colonoscopy     with polypectomy and biopsy  . Total hip arthroplasty     Family History  Problem Relation Age of Onset  . Heart attack Mother   . Heart attack Father     Social History:  reports that she has been smoking Cigarettes.  She does not have any smokeless tobacco history on file. She reports that she does not drink alcohol. Her drug history not on file.  Allergies: No Known Allergies  Medications:  I have reviewed the patient's current medications. Prior to Admission:  Prescriptions prior to admission  Medication Sig Dispense Refill  . levothyroxine (SYNTHROID) 100 MCG tablet Take 1 tablet (100 mcg total) by mouth daily.  90 tablet  0  . lisinopril-hydrochlorothiazide (PRINZIDE,ZESTORETIC) 20-12.5 MG per tablet Take 1 tablet by mouth daily.  90 tablet  0  . simvastatin (ZOCOR) 40 MG tablet Take 1 tablet (40 mg total) by mouth at bedtime.  90 tablet  0   Scheduled:   . levothyroxine  100 mcg Oral Daily  . potassium chloride  10 mEq Intravenous Once  . potassium chloride  10 mEq Intravenous Q1 Hr x 4  . sodium chloride  1,000 mL Intravenous Once  . sodium chloride  1,000 mL Intravenous Once   Continuous:   . sodium chloride 125 mL/hr at 10/28/11 1455  . DISCONTD: sodium chloride 0.9 % 1,000 mL infusion     PRN:  Results for orders placed  during the hospital encounter of 10/28/11 (from the past 48 hour(s))  CBC     Status: Abnormal   Collection Time   10/28/11  8:35 AM      Component Value Range Comment   WBC 10.6 (*) 4.0 - 10.5 K/uL    RBC 4.42  3.87 - 5.11 MIL/uL    Hemoglobin 15.4 (*) 12.0 - 15.0 g/dL    HCT 40.9  81.1 - 91.4 %    MCV 91.0  78.0 - 100.0 fL    MCH 34.8 (*) 26.0 - 34.0 pg    MCHC 38.3 (*) 30.0 - 36.0 g/dL RULED OUT INTERFERING SUBSTANCES   RDW 12.6  11.5 - 15.5 %    Platelets 242  150 - 400 K/uL PLATELET CLUMPS NOTED ON SMEAR, COUNT APPEARS ADEQUATE  BASIC METABOLIC PANEL     Status: Abnormal   Collection Time   10/28/11  8:35 AM      Component Value Range Comment   Sodium 112 (*) 135 - 145 mEq/L    Potassium 3.0 (*) 3.5 - 5.1 mEq/L    Chloride 73 (*) 96 - 112 mEq/L    CO2 25  19 - 32 mEq/L    Glucose, Bld 112 (*) 70 - 99 mg/dL    BUN  26 (*) 6 - 23 mg/dL    Creatinine, Ser 9.60 (*) 0.50 - 1.10 mg/dL    Calcium 9.8  8.4 - 45.4 mg/dL    GFR calc non Af Amer 37 (*) >90 mL/min    GFR calc Af Amer 43 (*) >90 mL/min   TROPONIN I     Status: Normal   Collection Time   10/28/11  8:35 AM      Component Value Range Comment   Troponin I <0.30  <0.30 ng/mL   URINALYSIS, ROUTINE W REFLEX MICROSCOPIC     Status: Normal   Collection Time   10/28/11  8:36 AM      Component Value Range Comment   Color, Urine YELLOW  YELLOW    APPearance CLEAR  CLEAR    Specific Gravity, Urine 1.008  1.005 - 1.030    pH 6.0  5.0 - 8.0    Glucose, UA NEGATIVE  NEGATIVE mg/dL    Hgb urine dipstick NEGATIVE  NEGATIVE    Bilirubin Urine NEGATIVE  NEGATIVE    Ketones, ur NEGATIVE  NEGATIVE mg/dL    Protein, ur NEGATIVE  NEGATIVE mg/dL    Urobilinogen, UA 1.0  0.0 - 1.0 mg/dL    Nitrite NEGATIVE  NEGATIVE    Leukocytes, UA NEGATIVE  NEGATIVE MICROSCOPIC NOT DONE ON URINES WITH NEGATIVE PROTEIN, BLOOD, LEUKOCYTES, NITRITE, OR GLUCOSE <1000 mg/dL.  PROTIME-INR     Status: Normal   Collection Time   10/28/11  9:43 AM       Component Value Range Comment   Prothrombin Time 12.5  11.6 - 15.2 seconds    INR 0.91  0.00 - 1.49     Ct Head Wo Contrast  10/28/2011  *RADIOLOGY REPORT*  Clinical Data: Confusion/altered mental status, fall 2 weeks ago  CT HEAD WITHOUT CONTRAST  Technique:  Contiguous axial images were obtained from the base of the skull through the vertex without contrast.  Comparison: None.  Findings: Subdural hematomas overlying the bilateral frontal convexities.  The left subdural collection appears acute/subacute and measures 6 mm in thickness.  The right subdural collection appears chronic and may reflect a hygroma, also measuring 6 mm in thickness.  Associated subdural blood along the tentorium. Additional subarachnoid hemorrhage in the right parietal occipital region (for example, series 2/image 19).  No definite parenchymal hemorrhage.  No mass lesion, mass effect, or midline shift.  No CT evidence of acute infarction.  Subcortical white matter and periventricular small vessel ischemic changes.  Intracranial atherosclerosis.  Mucous retention cyst in the left maxillary sinus.  The mastoid air cells are clear.  No evidence of calvarial fracture.  IMPRESSION: Acute/subacute left subdural hematoma and chronic right subdural hematoma/hygroma, as described above.  Additional associated subdural/subarachnoid hemorrhage posteriorly, as described above.  These results were called by telephone on 10/28/2011 at 0937 hours to Dr. Raeford Razor, who verbally acknowledged these results.   Original Report Authenticated By: Charline Bills, M.D.    Ct Cervical Spine Wo Contrast  10/28/2011  *RADIOLOGY REPORT*  Clinical Data: Altered mental status  CT CERVICAL SPINE WITHOUT CONTRAST  Technique:  Multidetector CT imaging of the cervical spine was performed. Multiplanar CT image reconstructions were also generated.  Comparison: None  Findings:  Straightening of normal cervical lordosis.  Vertebral body heights are well  preserved.  There is disc space narrowing and ventral endplate spurring at C4-5 and C5-6.  Facet joints are all well aligned.  The prevertebral soft tissue space is within normal limits.  There is no fracture or subluxation identified.  IMPRESSION:  1.  No acute findings.  2.  Straightening of normal cervical lordosis which may reflect muscle spasm or patient positioning. 3.  Cervical spondylosis   Original Report Authenticated By: Rosealee Albee, M.D.    Dg Chest Port 1 View  10/28/2011  *RADIOLOGY REPORT*  Clinical Data: COPD.  Smoker.  PORTABLE CHEST - 1 VIEW  Comparison: None  Findings: Heart size is normal.  There is no pleural effusion identified.  Atelectasis is noted within both lung bases.  No airspace consolidation.  IMPRESSION:  1.  Bibasilar atelectasis.   Original Report Authenticated By: Rosealee Albee, M.D.     Review of Systems  Unable to perform ROS  Blood pressure 73/55, pulse 59, temperature 97.7 F (36.5 C), temperature source Oral, resp. rate 15, height 5\' 8"  (1.727 m), weight 134 lb 0.6 oz (60.8 kg), SpO2 97.00%. Physical Exam  Constitutional: She appears well-developed and well-nourished.  HENT:  Head:    Eyes: EOM are normal. Pupils are equal, round, and reactive to light.  Neck: Normal range of motion. Neck supple.  Cardiovascular: Normal rate and regular rhythm.   Respiratory: Effort normal and breath sounds normal.  GI: Soft. Bowel sounds are normal. There is no tenderness.  Musculoskeletal: Normal range of motion.  Neurological: No cranial nerve deficit or sensory deficit. GCS eye subscore is 3. GCS verbal subscore is 4. GCS motor subscore is 6.  Skin: Skin is warm and dry.    Assessment/Plan: Fall SDH  Acute on chronic   Hyponatremia severe NSU/  Medicine to see.  No other injuries at this point or role  For trauma service.available as needed.  Sukari Grist A. 10/28/2011, 4:01 PM

## 2011-10-28 NOTE — ED Notes (Signed)
Pt asleep, awakens easily, denies any pain. Side rails up x 2 call bell at bedside. Pt in view of nurses desk

## 2011-10-28 NOTE — ED Notes (Signed)
Pt removed from Backboard. c-collar left in place.  Clothing removed and gowned, warm blankets provided.  Pt denies any pain, states she just needs to sit up. She states that she was walking this am and lost her balance and fell on the floor. Pt denies any injury.

## 2011-10-28 NOTE — ED Notes (Signed)
Pt in and out cathed for urine specimen. Urine yellow with sediment noted. Pt tolerated well

## 2011-10-28 NOTE — ED Notes (Signed)
Sodium called from lab MD informed

## 2011-10-28 NOTE — ED Provider Notes (Signed)
History    [name] presenting after fall. Pt says she was walking to bathroom she "stumbled" and fell. Not sure if she tripped or not. Happened early this morning. Denies preceding symptoms such as CP, SOB, dizziness or lightheadedness but reliability of history is questionable. Currently no complaints. Specifically denies HA, neck or back pain. No fever or chills. No family at bedside to provide additional hx.  Called home phone [phone]) with no response. Could not find additional contact info and pt unable to provide either.    No CSN: [id number]  Arrival date & time 10/28/11  8295   First MD Initiated Contact with Patient 10/28/11 [phone]      Chief Complaint  Patient presents with  . Altered Mental Status    (Consider location/radiation/quality/duration/timing/severity/associated sxs/prior treatment) HPI  Past Medical History  Diagnosis Date  . Compensated hypothyroidism   . Essential hypertension   . Dyslipidemia   . Colon polyps   . Anxiety   . COPD (chronic obstructive pulmonary disease)     secondary to smoking.  . Avascular necrosis     right femoral head  . Hyperlipidemia   . Smoking history   . Anemia      Postoperative hemorrhagic anemia    Past Surgical History  Procedure Date  . Colonoscopy     with polypectomy and biopsy  . Total hip arthroplasty     Family History  Problem Relation Age of Onset  . Heart attack Mother   . Heart attack Father     History  Substance Use Topics  . Smoking status: Current Some Day Smoker    Types: Cigarettes  . Smokeless tobacco: Not on file  . Alcohol Use: No    OB History    Grav Para Term Preterm Abortions TAB SAB Ect Mult Living                  Review of Systems  Level 5 caveat applies because pt is confused.   Allergies  Review of patient's allergies indicates no known allergies.  Home Medications   Current Outpatient Rx  Name Route Sig Dispense Refill  . LEVOTHYROXINE SODIUM 100 MCG PO TABS  Oral Take 1 tablet (100 mcg total) by mouth daily. 90 tablet 0    Patient needs to call for appointment  . LISINOPRIL-HYDROCHLOROTHIAZIDE 20-12.5 MG PO TABS Oral Take 1 tablet by mouth daily. 90 tablet 0    Patient needs to call for appointment  . SIMVASTATIN 40 MG PO TABS Oral Take 1 tablet (40 mg total) by mouth at bedtime. 90 tablet 0    Patient needs to call for appointment    BP 110/85  Pulse 94  Temp 97.7 F (36.5 C) (Oral)  Resp 20  SpO2 99%  Physical Exam  Nursing note and vitals reviewed. Constitutional: She appears well-developed and well-nourished. No distress.  HENT:  Head: Normocephalic.       Small abrasion to L forehead.  Eyes: Conjunctivae are normal. Pupils are equal, round, and reactive to light. Right eye exhibits no discharge. Left eye exhibits no discharge.  Neck: Neck supple.  Cardiovascular: Normal rate and normal heart sounds.  Exam reveals no gallop and no friction rub.   No murmur heard.      Irregularly irregular  Pulmonary/Chest: Effort normal and breath sounds normal. No respiratory distress.  Abdominal: Soft. She exhibits no distension. There is no tenderness.  Musculoskeletal: She exhibits no edema and no tenderness.  No midline spinal tenderness  Neurological: She is alert.       Disoriented to time. Thinks she is at home in bed. GCS 14: E4, V4, M 6.  Strength 5/5 b/l upper/lower extremities. CN2-12 intact.  Skin: Skin is warm and dry.  Psychiatric: She has a normal mood and affect. Her behavior is normal. Thought content normal.    ED Course  Procedures (including critical care time)  CRITICAL CARE Performed by: Raeford Razor   Total critical care time: 40 minutes  Critical care time was exclusive of separately billable procedures and treating other patients.  Critical care was necessary to treat or prevent imminent or life-threatening deterioration.  Critical care was time spent personally by me on the following activities:  development of treatment plan with patient and/or surrogate as well as nursing, discussions with consultants, evaluation of patient's response to treatment, examination of patient, obtaining history from patient or surrogate, ordering and performing treatments and interventions, ordering and review of laboratory studies, ordering and review of radiographic studies, pulse oximetry and re-evaluation of patient's condition.   Labs Reviewed  CBC - Abnormal; Notable for the following:    WBC 10.6 (*)     Hemoglobin 15.4 (*)     MCH 34.8 (*)     MCHC 38.3 (*)  RULED OUT INTERFERING SUBSTANCES   All other components within normal limits  BASIC METABOLIC PANEL - Abnormal; Notable for the following:    Sodium 112 (*)     Potassium 3.0 (*)     Chloride 73 (*)     Glucose, Bld 112 (*)     BUN 26 (*)     Creatinine, Ser 1.39 (*)     GFR calc non Af Amer 37 (*)     GFR calc Af Amer 43 (*)     All other components within normal limits  TROPONIN I  URINALYSIS, ROUTINE W REFLEX MICROSCOPIC  PROTIME-INR   Ct Head Wo Contrast  10/28/2011  *RADIOLOGY REPORT*  Clinical Data: Confusion/altered mental status, fall 2 weeks ago  CT HEAD WITHOUT CONTRAST  Technique:  Contiguous axial images were obtained from the base of the skull through the vertex without contrast.  Comparison: None.  Findings: Subdural hematomas overlying the bilateral frontal convexities.  The left subdural collection appears acute/subacute and measures 6 mm in thickness.  The right subdural collection appears chronic and may reflect a hygroma, also measuring 6 mm in thickness.  Associated subdural blood along the tentorium. Additional subarachnoid hemorrhage in the right parietal occipital region (for example, series 2/image 19).  No definite parenchymal hemorrhage.  No mass lesion, mass effect, or midline shift.  No CT evidence of acute infarction.  Subcortical white matter and periventricular small vessel ischemic changes.  Intracranial  atherosclerosis.  Mucous retention cyst in the left maxillary sinus.  The mastoid air cells are clear.  No evidence of calvarial fracture.  IMPRESSION: Acute/subacute left subdural hematoma and chronic right subdural hematoma/hygroma, as described above.  Additional associated subdural/subarachnoid hemorrhage posteriorly, as described above.  These results were called by telephone on 10/28/2011 at 0937 hours to Dr. Raeford Razor, who verbally acknowledged these results.   Original Report Authenticated By: Charline Bills, M.D.    Dg Chest Port 1 View  10/28/2011  *RADIOLOGY REPORT*  Clinical Data: COPD.  Smoker.  PORTABLE CHEST - 1 VIEW  Comparison: None  Findings: Heart size is normal.  There is no pleural effusion identified.  Atelectasis is noted within both lung bases.  No airspace consolidation.  IMPRESSION:  1.  Bibasilar atelectasis.   Original Report Authenticated By: Rosealee Albee, M.D.    EKG:  Rhythm: possible wondering atrial pacemaker with frequent PACs vs intermittent afib Rate: 90 Axis: normal Intervals/Conduction: inferior q waves.  ST segments: NS ST changes laterally.    1. Hyponatremia   2. Subdural hematoma, acute   3. Subdural hematoma, chronic   4. Subarachnoid bleed   5. Dehydration   6. Renal insufficiency       MDM  [name] s/p fall and reportedly change in her mental status from baseline. Significantly hyponatremic, but no indication for hypertonic saline without seizures. Chronicity is not clear, but presumably acute. Clinically dehydrated with dry mucus membranes and elevated BUN and renal insufficiency noted as well.  CT with evidence of acute and subacute/chronic subdurals and also subaracnoid bleed. No blood thinners on current med list and none noted on cardiology note from November. INR normal. No neurological deficits aside from mental status.  Will require admit for further tx and evaluation.    11:12 AM Discussed with trauma and neurosurgery.  Requesting medicine admit.  Discussed with CCM and will eval pt.     Raeford Razor, MD 10/28/11 1116

## 2011-10-28 NOTE — ED Notes (Signed)
Patient transported to CT. Pt alert when awoken, remains confused and pulling at lines

## 2011-10-28 NOTE — ED Notes (Signed)
Sodium 112

## 2011-10-28 NOTE — ED Notes (Signed)
Patient transported to CT 

## 2011-10-28 NOTE — ED Notes (Signed)
Admitting MD at bedside. Pt cooperative. Slightly confused at times.  IV site wrapped with kerlix to protect it.

## 2011-10-28 NOTE — ED Notes (Signed)
Pt sleeping at present. sats at 90 on room air. o2 applied via [place] at 2lpm

## 2011-10-29 ENCOUNTER — Inpatient Hospital Stay (HOSPITAL_COMMUNITY): Payer: Medicare Other

## 2011-10-29 LAB — BASIC METABOLIC PANEL
BUN: 14 mg/dL (ref 6–23)
Calcium: 8.7 mg/dL (ref 8.4–10.5)
Creatinine, Ser: 0.85 mg/dL (ref 0.50–1.10)
GFR calc Af Amer: 77 mL/min — ABNORMAL LOW (ref >90)
GFR calc non Af Amer: 67 mL/min — ABNORMAL LOW (ref >90)

## 2011-10-29 LAB — CBC
MCH: 33.6 pg (ref 26.0–34.0)
Platelets: 246 10*3/uL (ref 150–400)
RBC: 3.63 MIL/uL — ABNORMAL LOW (ref 3.87–5.11)
WBC: 9.1 10*3/uL (ref 4.0–10.5)

## 2011-10-29 MED ORDER — PANTOPRAZOLE SODIUM 40 MG IV SOLR
40.0000 mg | INTRAVENOUS | Status: DC
  Administered 2011-10-29 – 2011-10-30 (×2): 40 mg via INTRAVENOUS
  Filled 2011-10-29 (×3): qty 40

## 2011-10-29 NOTE — Progress Notes (Signed)
SLP Cancellation Note ST received order for Cognitive Linguistic Evaluation. Evaluation deferred to 10/30/11.   Moreen Fowler MS, CCC-SLP [phone]  [facility] 10/29/2011, 11:25 AM

## 2011-10-29 NOTE — Progress Notes (Signed)
eLink Physician-Brief Progress Note Patient Name: Emma Douglas DOB: 09/24/1940 MRN: [id number]  Date of Service  10/29/2011   HPI/Events of Note   Sup proph  eICU Interventions  protonix iv   Intervention Category Intermediate Interventions: Best-practice therapies (e.g. DVT, beta blocker, etc.)  [name] 10/29/2011, 4:16 AM

## 2011-10-29 NOTE — Progress Notes (Addendum)
Name: Emma Douglas MRN: [id number] DOB: 07/22/1940    LOS: 1  Referring Provider:  Juleen China Reason for Referral:  Altered mental status, traumatic SDH  PULMONARY / CRITICAL CARE MEDICINE  Brief patient description:   71 year old female admitted 8/24 w/ traumatic bilateral SDH after a fall. States was essentially in usual state of health but has had some difficulty w/ balance in weeks prior to admit. Found to be hyponatremic w/ AKI on presentation. PCCM asked to admit.   Current Status: Improved. Ct NEck neg. F/u CT head stable   Vital Signs: Temp:  [97.5 F (36.4 C)-97.7 F (36.5 C)] 97.7 F (36.5 C) (08/25 0400) Pulse Rate:  [28-98] 57  (08/25 0800) Resp:  [12-21] 12  (08/25 0800) BP: (72-123)/(25-73) 98/53 mmHg (08/25 0800) SpO2:  [94 %-100 %] 100 % (08/25 0800) Weight:  [60.8 kg (134 lb 0.6 oz)-62.2 kg (137 lb 2 oz)] 62.2 kg (137 lb 2 oz) (08/25 0400)  Physical Examination: General:  Chronically ill appearing AAF, improved skin turgor.  Neuro: more alert  HEENT:  Mucous membranes moist. No JVD Cardiovascular:  rrr Lungs:  CTA Abdomen:  Soft non-tender Musculoskeletal:  intact Skin:  Multiple areas of ecchymosis.   Active Problems:  Hypothyroidism  Hyponatremia  Hypokalemia  Acute encephalopathy  Dehydration  Hypotension  Subdural hematoma, post-traumatic   ASSESSMENT AND PLAN  PULMONARY No results found for this basename: PHART:5,PCO2:5,PCO2ART:5,PO2ART:5,HCO3:5,O2SAT:5 in the last 168 hours Ventilator Settings:   CXR: no acute infiltrates.    A:  No acute issues. Currently protecting airway.  P:   tfr to floor  CARDIOVASCULAR  Lab 10/28/11 0835  TROPONINI <0.30  LATICACIDVEN --  PROBNP --   ECG:  Reviewed  Lines: PIV  A: Hypovolemia and hypovolemic shock.  Volume depletion and concomitant diuretic rx.   P:  Hold htn meds Reduce ivf  RENAL  Lab 10/29/11 0645 10/28/11 1421 10/28/11 0835  NA 125* 116* 112*  K 3.4* 3.2* --  CL 91*  77* 73*  CO2 23 28 25   BUN 14 23 26*  CREATININE 0.85 1.18* 1.39*  CALCIUM 8.7 9.6 9.8  MG -- -- --  PHOS -- -- --   Intake/Output      08/24 0701 - 08/25 0700 08/25 0701 - 08/26 0700   P.O. 80 800   I.V. (mL/kg) 135.4 (2.2) 250 (4)   IV Piggyback 1400    Total Intake(mL/kg) 1615.4 (26) 1050 (16.9)   Urine (mL/kg/hr) 1700 (1.1)    Total Output 1700    Net -84.6 +1050        Urine Occurrence 1 x     Foley:  8/24  A:   1) Dehydration       2) hyponatremia       3) hypokalemia       4) hypochloremia       5) acute renal failure  All improved  P:   Reduce IVF F/u bmet    GASTROINTESTINAL No results found for this basename: AST:5,ALT:5,ALKPHOS:5,BILITOT:5,PROT:5,ALBUMIN:5 in the last 168 hours  A:  Malnutrition   P:   Send pre-albumin  Dietary consult when she can take pos Advanced to healthy diet  HEMATOLOGIC  Lab 10/29/11 0645 10/28/11 0943 10/28/11 0835  HGB 12.2 -- 15.4*  HCT 33.9* -- 40.2  PLT 246 -- 242  INR -- 0.91 --  APTT -- -- --   A:  Hemoconcentration  P:  Cont PPI   INFECTIOUS  Lab 10/29/11 0645 10/28/11  0835  WBC 9.1 10.6*  PROCALCITON -- --   Cultures: UC 8/24>>>  Antibiotics: NONE   A:  Mild leukocytosis.  No clear evidence of infection   P:   Send UA  ENDOCRINE No results found for this basename: GLUCAP:5 in the last 168 hours A:  Hypothyroidism   P:   Send TSH Cont synthroid   NEUROLOGIC CT head 8/24: Acute/subacute left subdural hematoma and chronic right subdural  hematoma/hygroma, as described above.  CT C spine 8/24>>>normal CT head 8/24>>>small amount of Bilateral Subdural blood, no shift ,  CT head 8/25>>>no changes in SDH  A:  Acute encephalopathy      Traumatic bilateral SDH       Not surgical candidate .  P:     BEST PRACTICE / DISPOSITION Level of Care:  Icu>>tfr to floor Primary Service:  pccm  Consultants:  Neurosurgery eval in ED>>no surgery needed, call prn Code Status:  full Diet: reg  diet DVT Px:  PAS GI Px: PPI Skin Integrity:  Multiple areas of ecchymosis  Social / Family:  Daughters updated at bedside   Tfr to floor    Dorcas Carrow Beeper  [phone]  Cell  [phone]  If no response or cell goes to voicemail, call beeper [phone]   10/29/2011, 10:08 AM

## 2011-10-29 NOTE — Evaluation (Signed)
Physical Therapy Evaluation Patient Details Name: Emma Douglas MRN: [id number] DOB: 1940/05/29 Today's Date: 10/29/2011 Time: 4098-1191 PT Time Calculation (min): 23 min  PT Assessment / Plan / Recommendation Clinical Impression  Pt s/p fall with SDH. Pt with balance deficits increasing her risk of falls and decreased safety. Pt will benefit from skilled PT in the acute care setting in order to maximize functional mobility, balance and safety. Discussed ST-SNF with pt and daughter, daughter and pt agreeable, although pt unhappy and would prefer to d/c home.     PT Assessment  Patient needs continued PT services    Follow Up Recommendations  Skilled nursing facility;Supervision/Assistance - 24 hour    Barriers to Discharge Decreased caregiver support      Equipment Recommendations  Defer to next venue    Recommendations for Other Services     Frequency Min 4X/week    Precautions / Restrictions Precautions Precautions: Fall Restrictions Weight Bearing Restrictions: No   Pertinent Vitals/Pain Pt with no pain complaints throughout session      Mobility  Bed Mobility Bed Mobility: Supine to Sit;Sitting - Scoot to Edge of Bed Supine to Sit: 4: Min assist;With rails;HOB elevated Sitting - Scoot to [place] of Bed: 4: Min assist Details for Bed Mobility Assistance: VC for proper sequencing. Assist through trunk into sitting with cues for safety.  Transfers Transfers: Sit to Stand;Stand to Sit Sit to Stand: 4: Min assist;With upper extremity assist;From bed Stand to Sit: 4: Min assist;With upper extremity assist;To chair/3-in-1 Details for Transfer Assistance: VC for safe hand placement and proper technique. Assist for controlled ascent/descent and for balance upon standing Ambulation/Gait Ambulation/Gait Assistance: 3: Mod assist Ambulation Distance (Feet): 25 Feet Assistive device: 1 person hand held assist Ambulation/Gait Assistance Details: Pt with unsteady scissoring  gait. Pt with >4 loss of balances during ambulation around room. Assist for proper weight shifting as well as upright posture for a safe gait pattern. Cues for posture and safety Gait Pattern: Step-to pattern;Scissoring;Decreased hip/knee flexion - right;Decreased hip/knee flexion - left;Decreased stride length;Narrow base of support Gait velocity: decreased gait speed    Exercises     PT Diagnosis: Abnormality of gait;Difficulty walking  PT Problem List: Decreased activity tolerance;Decreased balance;Decreased mobility;Decreased knowledge of use of DME;Decreased safety awareness;Decreased knowledge of precautions PT Treatment Interventions: DME instruction;Gait training;Functional mobility training;Therapeutic activities;Therapeutic exercise;Balance training;Neuromuscular re-education;Patient/family education   PT Goals Acute Rehab PT Goals PT Goal Formulation: With patient/family Time For Goal Achievement: 11/05/11 Potential to Achieve Goals: Fair Pt will go Supine/Side to Sit: with modified independence PT Goal: Supine/Side to Sit - Progress: Goal set today Pt will go Sit to Supine/Side: with modified independence PT Goal: Sit to Supine/Side - Progress: Goal set today Pt will go Sit to Stand: with supervision PT Goal: Sit to Stand - Progress: Goal set today Pt will go Stand to Sit: with supervision PT Goal: Stand to Sit - Progress: Goal set today Pt will Transfer Bed to Chair/Chair to Bed: with supervision PT Transfer Goal: Bed to Chair/Chair to Bed - Progress: Goal set today Pt will Ambulate: 51 - 150 feet;with supervision;with least restrictive assistive device PT Goal: Ambulate - Progress: Goal set today  Visit Information  Last PT Received On: 10/29/11 Assistance Needed: +1    Subjective Data      Prior Functioning  Home Living Lives With: Family Available Help at Discharge: Family (daughter works 1st shift) Type of Home: House Home Access: Stairs to enter [employer] of Steps: 4 Entrance  Stairs-Rails: None Home Layout: One level Bathroom Shower/Tub: Other (comment) (foot tub) Bathroom Toilet: Handicapped height Bathroom Accessibility: Yes How Accessible: Accessible via walker Home Adaptive Equipment: Walker - rolling;Crutches Additional Comments: goes outside to shower, uses "foot tub" Prior Function Level of Independence: Independent with assistive device(s) Able to Take Stairs?: Yes Driving: No Vocation: Retired Musician: No difficulties Dominant Hand: Right    Cognition  Overall Cognitive Status: History of cognitive impairments - at baseline Arousal/Alertness: Awake/alert Orientation Level: Appears intact for tasks assessed Behavior During Session: [facility] for tasks performed    Extremity/Trunk Assessment Right Lower Extremity Assessment RLE ROM/Strength/Tone: Within functional levels RLE Sensation: WFL - Light Touch Left Lower Extremity Assessment LLE ROM/Strength/Tone: Within functional levels LLE Sensation: WFL - Light Touch   Balance Balance Balance Assessed: Yes (see gait)  End of Session PT - End of Session Equipment Utilized During Treatment: Gait belt Activity Tolerance: Patient tolerated treatment well Patient left: in chair;with call bell/phone within reach;with family/visitor present;Other (comment) (sitter in room) Nurse Communication: Mobility status    Milana Kidney 10/29/2011, 12:50 PM  10/29/2011 Milana Kidney DPT PAGER: [phone] OFFICE: [phone]

## 2011-10-29 NOTE — Progress Notes (Signed)
Patient is stable.  No continuing traumatic issues.  NS was consulted in the ED.  No intervention necessary, but they did not put in a note.   We will signoff.  Na+ up to 125.  Marta Lamas. Gae Bon, MD, FACS [phone] Trauma Surgeon

## 2011-10-30 MED ORDER — ACETAMINOPHEN 325 MG PO TABS
650.0000 mg | ORAL_TABLET | Freq: Four times a day (QID) | ORAL | Status: DC | PRN
  Administered 2011-10-30: 650 mg via ORAL
  Filled 2011-10-30: qty 2

## 2011-10-30 MED ORDER — PANTOPRAZOLE SODIUM 40 MG PO TBEC
40.0000 mg | DELAYED_RELEASE_TABLET | Freq: Every day | ORAL | Status: DC
  Filled 2011-10-30: qty 1

## 2011-10-30 MED ORDER — SIMVASTATIN 40 MG PO TABS
40.0000 mg | ORAL_TABLET | Freq: Every day | ORAL | Status: DC
  Administered 2011-10-30 – 2011-10-31 (×2): 40 mg via ORAL
  Filled 2011-10-30 (×3): qty 1

## 2011-10-30 NOTE — Evaluation (Signed)
Speech Language Pathology Evaluation Patient Details Name: QUANIYAH BUGH MRN: [id number] DOB: 04/03/1940 Today's Date: 10/30/2011 Time: 2956-2130 SLP Time Calculation (min): 26 min  Problem List:  Patient Active Problem List  Diagnosis  . Benign hypertensive heart disease without heart failure  . Hypercholesterolemia  . Hypothyroidism  . Hyponatremia  . Hypokalemia  . Acute encephalopathy  . Dehydration  . Hypotension  . Subdural hematoma, post-traumatic   Past Medical History:  Past Medical History  Diagnosis Date  . Compensated hypothyroidism   . Essential hypertension   . Dyslipidemia   . Colon polyps   . Anxiety   . COPD (chronic obstructive pulmonary disease)     secondary to smoking.  . Avascular necrosis     right femoral head  . Hyperlipidemia   . Smoking history   . Anemia      Postoperative hemorrhagic anemia   Past Surgical History:  Past Surgical History  Procedure Date  . Colonoscopy     with polypectomy and biopsy  . Total hip arthroplasty    HPI:  This is a 71 year old female who presented to the ER on 8/24 after being found by her neighbor on the floor. Pt states had been having difficulty with balance in days leading up to presentation. Denied prior falls. She is a poor historian but does report poor po intake and weight loss but was unable to elaborate on how long that had been going on for. Pt with small bilateral subdural hematomas.    Assessment / Plan / Recommendation Clinical Impression  Pt presents with mild to moderate deficits regarding safety awareness, intellectual awareenss of deficits. Moderate verbal cues needed for reasoning regarding need for assistance/therapy. Suspect however that these are baseline deficits based on reports and not a result of mild SDH. Would not recommend acute or SNF SLP f/u. Pt will recieve safety awareness training with PT.     SLP Assessment  Patient does not need any further Speech Lanaguage Pathology  Services    Follow Up Recommendations       Frequency and Duration        Pertinent Vitals/Pain NA   SLP Goals     SLP Evaluation Prior Functioning  Cognitive/Linguistic Baseline: Information not available Type of Home: House Lives With: Family Available Help at Discharge: Family Vocation: Retired   IT consultant  Overall Cognitive Status: Impaired at baseline Arousal/Alertness: Awake/alert Orientation Level: Oriented to person;Oriented to place;Oriented to time;Oriented to situation Attention: Sustained;Selective Sustained Attention: Appears intact Selective Attention: Appears intact Memory: Impaired Memory Impairment: Decreased recall of new information Awareness: Impaired Awareness Impairment: Intellectual impairment;Emergent impairment;Anticipatory impairment Problem Solving: Appears intact (with basic/verbal functional tasks) Executive Function: Reasoning;Decision Making;Self Monitoring Reasoning: Impaired Reasoning Impairment: Functional basic Decision Making: Impaired Decision Making Impairment: Functional basic Self Monitoring: Impaired Self Monitoring Impairment: Functional basic Safety/Judgment: Impaired    Comprehension  Auditory Comprehension Overall Auditory Comprehension: Appears within functional limits for tasks assessed    Expression Verbal Expression Overall Verbal Expression: Appears within functional limits for tasks assessed   Oral / Motor Oral Motor/Sensory Function Overall Oral Motor/Sensory Function: Appears within functional limits for tasks assessed   GO     Khadeejah Castner, Riley Nearing 10/30/2011, 3:51 PM

## 2011-10-30 NOTE — Progress Notes (Signed)
Physical Therapy Treatment Patient Details Name: JI FAIRBURN MRN: [id number] DOB: [birth year] Today's Date: 10/30/2011 Time: 4098-1191 PT Time Calculation (min): 25 min  PT Assessment / Plan / Recommendation Comments on Treatment Session  Pt able to increase ambulation distance and perform sit <> stand x 5.  Pt continues to need max cues for safety and proper technique.     Follow Up Recommendations  Skilled nursing facility;Supervision/Assistance - 24 hour    Barriers to Discharge        Equipment Recommendations  Defer to next venue    Recommendations for Other Services    Frequency Min 4X/week   Plan Discharge plan remains appropriate    Precautions / Restrictions Precautions Precautions: Fall Restrictions Weight Bearing Restrictions: No   Pertinent Vitals/Pain No c/o pain    Mobility  Bed Mobility Bed Mobility: Supine to Sit Supine to Sit: 4: Min assist;With rails;HOB elevated Sitting - Scoot to Edge of Bed: 4: Min assist Details for Bed Mobility Assistance: (A) to elevate trunk OOB with cues for proper technique.  Max cues for proper technique.  Pt tends to lean posterior. Transfers Transfers: Sit to Stand;Stand to Sit Sit to Stand: 4: Min assist;With upper extremity assist;From bed Stand to Sit: 4: Min assist;With upper extremity assist;To chair/3-in-1 Details for Transfer Assistance: (A) to initiate transfer and slowly descend to recliner with max cues for hand placement Ambulation/Gait Ambulation/Gait Assistance: 4: Min assist;3: Mod assist Ambulation Distance (Feet): 80 Feet Assistive device: 1 person hand held assist Ambulation/Gait Assistance Details: Occasional mod (A) to maintain balance due to LOB x 3 with posterior lean.  Max cues to increase BOS and ambulate straight.  Pt continues to have staggered gait.  May attempt RW next session. Gait Pattern: Step-to pattern;Scissoring;Decreased hip/knee flexion - right;Decreased hip/knee flexion -  left;Decreased stride length;Narrow base of support Gait velocity: decreased gait speed    Exercises General Exercises - Lower Extremity Mini-Sqauts: 5 reps;Both;Seated   PT Diagnosis:    PT Problem List:   PT Treatment Interventions:     PT Goals Acute Rehab PT Goals PT Goal Formulation: With patient/family Time For Goal Achievement: 11/05/11 Potential to Achieve Goals: Fair Pt will go Supine/Side to Sit: with modified independence PT Goal: Supine/Side to Sit - Progress: Progressing toward goal Pt will go Sit to Supine/Side: with modified independence PT Goal: Sit to Supine/Side - Progress: Progressing toward goal Pt will go Sit to Stand: with supervision PT Goal: Sit to Stand - Progress: Progressing toward goal Pt will go Stand to Sit: with supervision PT Goal: Stand to Sit - Progress: Progressing toward goal Pt will Transfer Bed to Chair/Chair to Bed: with supervision PT Transfer Goal: Bed to Chair/Chair to Bed - Progress: Progressing toward goal Pt will Ambulate: 51 - 150 feet;with supervision;with least restrictive assistive device PT Goal: Ambulate - Progress: Progressing toward goal  Visit Information  Last PT Received On: 10/30/11 Assistance Needed: +1    Subjective Data  Subjective: "I'm hunagry." Patient Stated Goal: To eventually go home   Cognition  Overall Cognitive Status: History of cognitive impairments - at baseline Arousal/Alertness: Awake/alert Orientation Level: Appears intact for tasks assessed Behavior During Session: [facility] for tasks performed    Balance  Balance Balance Assessed: Yes Static Standing Balance Static Standing - Balance Support: No upper extremity supported Static Standing - Level of Assistance: 5: Stand by assistance;4: Min assist Static Standing - Comment/# of Minutes: ~ 30 seconds x 5 with occasional min (A) due to posterior lean.  Pt attempts to lean against chair.  End of Session PT - End of Session Equipment Utilized During  Treatment: Gait belt Activity Tolerance: Patient tolerated treatment well Patient left: in chair;with call bell/phone within reach;with family/visitor present;Other (comment) Nurse Communication: Mobility status   GP     Margerie Fraiser 10/30/2011, 1:01 PM Jake Shark, PT DPT [phone]

## 2011-10-30 NOTE — Progress Notes (Signed)
[facility] Progress Note [place] TEAM 1 - Stepdown/ICU TEAM  Assumption of Care Note   Emma Douglas ZOX:[id number] DOB: 11-27-1940 DOA: 10/28/2011 PCP: Sheila Oats, MD  Brief narrative: 71 year old female who presented to the ER on 8/24 after being found by her neighbor on the floor. Pt had been having difficulty with balance in days leading up to presentation. Denied prior falls. She is a poor historian but does report poor po intake and weight loss but was unable to elaborate on how long that had been going on. Denied LOC, seizures, chest pain, cough, SOB, nausea, vomiting, diarrhea, urinary incont.. Denied ETOH or drug intake - reported taking medications as prescribed. ER evaluation demonstrated small bilateral SDH, marked Hyponatremia, and renal insufficiency. Her films were evaluated by Neuro-surgery. She was not felt to require surgical intervention. PCCM was asked to admit.   Assessment/Plan:  Traumatic bilateral SDH Did not require surgical intervention - is improving clinically - continue physical therapy and occupational therapy  Hypovolemia and hypovolemic shock / Dehydration Resolved with volume resuscitation  Hyponatremia Slowly improving with volume resuscitation - follow  Hypokalemia Improving - continue to replace  Acute renal failure Renal fxn has now normalized - was due to prerenal azotemia  Protein calorie malnutrition? Check albumin in a.m.  - consider Nutrition consult  Hypothyroidism TSH at goal  HTN BP is now rebounding - may need to resume medical treatment within next 24 hour  MRSA screen + Maintaining contact precautions  Code Status: Full Disposition Plan: PT suggests SNF - to medical bed today (was ordered yesterday)  Consultants: Neurosurgery - via ER Trauma Service - signed off PCCM - admit>>8/25  Procedures: CT C spine 8/24>>>normal  CT head 8/24>>>small amount of Bilateral Subdural blood, no shift  CT head  8/25>>>no changes in SDH  Antibiotics: none  DVT prophylaxis: SCDs - no pharm agents due to ICH  HPI/Subjective: The patient is up walking with physical therapy and occupational therapy.  She seems mildly confused.  Her baseline is not known to me.  She is interactive and quite pleasant.  She denies complaints.   Objective: Blood pressure 164/115, pulse 60, temperature 97.5 F (36.4 C), temperature source Oral, resp. rate 18, height 5\' 8"  (1.727 m), weight 62.2 kg (137 lb 2 oz), SpO2 100.00%.  Intake/Output Summary (Last 24 hours) at 10/30/11 1041 Last data filed at 10/30/11 0857  Gross per 24 hour  Intake   2595 ml  Output   1750 ml  Net    845 ml    Exam: General: No acute respiratory distress Lungs: Clear to auscultation bilaterally without wheezes or crackles Cardiovascular: Regular rate and rhythm without murmur gallop or rub  Abdomen: Nontender, nondistended, soft, bowel sounds positive, no rebound, no ascites, no appreciable mass Extremities: No significant cyanosis, clubbing, or edema bilateral lower extremities  Data Reviewed: Basic Metabolic Panel:  Lab 10/29/11 8119 10/28/11 1421 10/28/11 0835  NA 125* 116* 112*  K 3.4* 3.2* 3.0*  CL 91* 77* 73*  CO2 23 28 25   GLUCOSE 88 88 112*  BUN 14 23 26*  CREATININE 0.85 1.18* 1.39*  CALCIUM 8.7 9.6 9.8  MG -- -- --  PHOS -- -- --   CBC:  Lab 10/29/11 0645 10/28/11 0835  WBC 9.1 10.6*  NEUTROABS -- --  HGB 12.2 15.4*  HCT 33.9* 40.2  MCV 93.4 91.0  PLT 246 242   Cardiac Enzymes:  Lab 10/28/11 0835  CKTOTAL --  CKMB --  CKMBINDEX --  TROPONINI <0.30    Recent Results (from the past 240 hour(s))  MRSA PCR SCREENING     Status: Abnormal   Collection Time   10/28/11  1:42 PM      Component Value Range Status Comment   MRSA by PCR POSITIVE (*) NEGATIVE Final     Studies:  Recent x-ray studies have been reviewed in detail by the Attending Physician  Scheduled Meds:  Reviewed in detail by the  Attending Physician   Lonia Blood, MD [facility] Office  [phone] Pager [phone]  On-Call/Text Page:      [email or web address]      password [email or web address]  If 7PM-7AM, please contact night-coverage [email or web address] Password [email or web address] 10/30/2011, 10:41 AM   LOS: 2 days

## 2011-10-30 NOTE — Clinical Social Work Psychosocial (Signed)
     Clinical Social Work Department BRIEF PSYCHOSOCIAL ASSESSMENT 10/30/2011  Patient:  Emma Douglas, Emma Douglas     Account Number:  [id number]     Admit date:  10/28/2011  Clinical Social Worker:  Peggyann Shoals  Date/Time:  10/30/2011 11:30 AM  Referred by:  Physician  Date Referred:  10/30/2011 Referred for  SNF Placement   Other Referral:   Interview type:  Patient Other interview type:    PSYCHOSOCIAL DATA Living Status:  ALONE Admitted from facility:   Level of care:   Primary support name:  Emma Douglas Primary support relationship to patient:  CHILD, ADULT Degree of support available:   Supportive.    CURRENT CONCERNS Current Concerns  Post-Acute Placement   Other Concerns:    SOCIAL WORK ASSESSMENT / PLAN CSW met with pt to address consult for SNF. PT is currently recommending SNF vs home health services with 24 supervision. CSW introduced herself and explained the role of social work. Pt shared that lives alont and was indenpendent prior to admission. Pt stated that she did not want to go to a SNF in favor of returning home. Pt stated that her family is able to provide the needed 24 hour supervision. Pt also stated that her daughter, Emma Douglas, does not want pt to go to a SNF. Pt is agreeable to this CSW speaking with pt's daughter to address discharge plan. At time of note, CSW left messages with pt's daughter requesting a return phone call. CSW will follow up with pt's daughter. CSW will continue to follow in regards to discharge plan.   Assessment/plan status:  Psychosocial Support/Ongoing Assessment of Needs Other assessment/ plan:   Information/referral to community resources:   SNF list    PATIENTS/FAMILYS RESPONSE TO PLAN OF CARE: Pt appeared to be oriented. Pt was very pleasant. Discharge plan has yet to be determined.

## 2011-10-31 DIAGNOSIS — R9431 Abnormal electrocardiogram [ECG] [EKG]: Secondary | ICD-10-CM

## 2011-10-31 DIAGNOSIS — N179 Acute kidney failure, unspecified: Secondary | ICD-10-CM

## 2011-10-31 DIAGNOSIS — Z720 Tobacco use: Secondary | ICD-10-CM

## 2011-10-31 LAB — CBC
HCT: 32.3 % — ABNORMAL LOW (ref 36.0–46.0)
MCH: 34.6 pg — ABNORMAL HIGH (ref 26.0–34.0)
MCV: 95.6 fL (ref 78.0–100.0)
Platelets: 289 10*3/uL (ref 150–400)
RDW: 13.2 % (ref 11.5–15.5)

## 2011-10-31 LAB — BASIC METABOLIC PANEL
CO2: 27 mEq/L (ref 19–32)
Calcium: 9.1 mg/dL (ref 8.4–10.5)
Creatinine, Ser: 0.73 mg/dL (ref 0.50–1.10)
Glucose, Bld: 93 mg/dL (ref 70–99)

## 2011-10-31 MED ORDER — IPRATROPIUM BROMIDE 0.02 % IN SOLN: 0.5000 mg | RESPIRATORY_TRACT | Status: DC | PRN

## 2011-10-31 MED ORDER — ALBUTEROL SULFATE (5 MG/ML) 0.5% IN NEBU: 2.5000 mg | INHALATION_SOLUTION | RESPIRATORY_TRACT | Status: DC | PRN

## 2011-10-31 MED ORDER — LISINOPRIL 20 MG PO TABS
20.0000 mg | ORAL_TABLET | Freq: Every day | ORAL | Status: DC
  Administered 2011-10-31 – 2011-11-01 (×2): 20 mg via ORAL
  Filled 2011-10-31 (×2): qty 1

## 2011-10-31 MED ORDER — POTASSIUM CHLORIDE 20 MEQ/15ML (10%) PO LIQD
20.0000 meq | Freq: Every day | ORAL | Status: DC
  Administered 2011-10-31: 20 meq via ORAL
  Filled 2011-10-31 (×2): qty 15

## 2011-10-31 NOTE — Clinical Social Work Placement (Addendum)
    Clinical Social Work Department CLINICAL SOCIAL WORK PLACEMENT NOTE 10/31/2011  Patient:  Emma Douglas, Emma Douglas  Account Number:  [id number] Admit date:  10/28/2011  Clinical Social Worker:  Peggyann Shoals  Date/time:  10/31/2011 11:52 AM  Clinical Social Work is seeking post-discharge placement for this patient at the following level of care:   SKILLED NURSING   (*CSW will update this form in Epic as items are completed)   10/30/2011  Patient/family provided with [facility] Department of Clinical Social Work's list of facilities offering this level of care within the geographic area requested by the patient (or if unable, by the patient's family).  10/30/2011  Patient/family informed of their freedom to choose among providers that offer the needed level of care, that participate in Medicare, Medicaid or managed care program needed by the patient, have an available bed and are willing to accept the patient.  10/30/2011  Patient/family informed of [facility]' ownership interest in [facility], as well as of the fact that they are under no obligation to receive care at this facility.  PASARR submitted to EDS on 10/31/2011 PASARR number received from EDS on   FL2 transmitted to all facilities in geographic area requested by pt/family on  10/31/2011 FL2 transmitted to all facilities within larger geographic area on   Patient informed that his/her managed care company has contracts with or will negotiate with  certain facilities, including the following:     Patient/family informed of bed offers received:  10/31/2011 Patient chooses bed at [facility] Physician recommends and patient chooses bed at  SNF  Patient to be transferred to  [facility] on 11/01/2011 Patient to be transferred to facility by [facility]  The following physician request were entered in Epic:   Additional Comments:

## 2011-10-31 NOTE — Progress Notes (Signed)
[facility] PROGRESS NOTE  Emma Douglas IHK:[id number] DOB: April 14, 1940 DOA: 10/28/2011 PCP: Sheila Oats, MD  Assessment/Plan: Traumatic bilateral SDH  Did not require surgical intervention - is improving clinically - continue physical therapy and occupational therapy  Hypovolemia and hypovolemic shock / Dehydration  Resolved with volume resuscitation  Hyponatremia  -Partly due to HCTZ at home -Improved with fluid resuscitation Abnormal EKG -Abnormal at the time of admission, Douglas recheck -Concerned about AV block from initial EKG,? Mobitz type 2 -Patient is asymptomatic and hemodynamically stable at this time Hypokalemia  Improving - continue to replace  Acute renal failure  Renal fxn has now normalized - was due to prerenal azotemia, partly from hydrochlorothiazide Protein calorie malnutrition?  Check prealbumin for tomorrow Hypothyroidism  TSH at goal  HTN -Restart lisinopril without HCTZ Tobacco abuse -One half pack per day x30 years -Albuterol and Atrovent prn  Procedures/Studies: Ct Head Wo Contrast  10/29/2011  *RADIOLOGY REPORT*  Clinical Data: Fall 2 weeks ago, follow up subdural/subarachnoid hemorrhage  CT HEAD WITHOUT CONTRAST  Technique:  Contiguous axial images were obtained from the base of the skull through the vertex without contrast.  Comparison: 10/28/2011  Findings: Motion degraded images.  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  No acute/subacute left subdural hematoma overlying the left frontal convexity, measuring 7 mm on the current study, likely unchanged.  Additional subdural blood along the tentorium (series 2/image 15).  Small amount of subarachnoid hemorrhage is present in the right posterior parietal lobe (series 3/image 2) and also in the parasagittal right frontal lobe (series 3/image 5).  Mild cortical atrophy.  No ventriculomegaly.   Subcortical white matter and periventricular small vessel ischemic changes.  Intracranial atherosclerosis.  Mucous retention cyst in the left maxillary sinus.  The visualized paranasal sinuses and mastoid air cells are otherwise clear.  No fracture or dislocation is seen.  IMPRESSION: Stable bifrontal subdural collections, as described above.  Additional subarachnoid/subdural hemorrhage are also essentially unchanged.   Original Report Authenticated By: Charline Bills, M.D.    Ct Head Wo Contrast  10/28/2011  *RADIOLOGY REPORT*  Clinical Data: Confusion/altered mental status, fall 2 weeks ago  CT HEAD WITHOUT CONTRAST  Technique:  Contiguous axial images were obtained from the base of the skull through the vertex without contrast.  Comparison: None.  Findings: Subdural hematomas overlying the bilateral frontal convexities.  The left subdural collection appears acute/subacute and measures 6 mm in thickness.  The right subdural collection appears chronic and may reflect a hygroma, also measuring 6 mm in thickness.  Associated subdural blood along the tentorium. Additional subarachnoid hemorrhage in the right parietal occipital region (for example, series 2/image 19).  No definite parenchymal hemorrhage.  No mass lesion, mass effect, or midline shift.  No CT evidence of acute infarction.  Subcortical white matter and periventricular small vessel ischemic changes.  Intracranial atherosclerosis.  Mucous retention cyst in the left maxillary sinus.  The mastoid air cells are clear.  No evidence of calvarial fracture.  IMPRESSION: Acute/subacute left subdural hematoma and chronic right subdural hematoma/hygroma, as described above.  Additional associated subdural/subarachnoid hemorrhage posteriorly, as described above.  These results were called by telephone on 10/28/2011 at 0937 hours to Dr. Raeford Razor, who verbally acknowledged these results.   Original Report Authenticated By: Charline Bills, M.D.    Ct  Cervical Spine Wo Contrast  10/28/2011  *RADIOLOGY REPORT*  Clinical Data: Altered mental status  CT CERVICAL SPINE WITHOUT  CONTRAST  Technique:  Multidetector CT imaging of the cervical spine was performed. Multiplanar CT image reconstructions were also generated.  Comparison: None  Findings:  Straightening of normal cervical lordosis.  Vertebral body heights are well preserved.  There is disc space narrowing and ventral endplate spurring at C4-5 and C5-6.  Facet joints are all well aligned.  The prevertebral soft tissue space is within normal limits.  There is no fracture or subluxation identified.  IMPRESSION:  1.  No acute findings.  2.  Straightening of normal cervical lordosis which may reflect muscle spasm or patient positioning. 3.  Cervical spondylosis   Original Report Authenticated By: Rosealee Albee, M.D.    Dg Chest Port 1 View  10/28/2011  *RADIOLOGY REPORT*  Clinical Data: COPD.  Smoker.  PORTABLE CHEST - 1 VIEW  Comparison: None  Findings: Heart size is normal.  There is no pleural effusion identified.  Atelectasis is noted within both lung bases.  No airspace consolidation.  IMPRESSION:  1.  Bibasilar atelectasis.   Original Report Authenticated By: Rosealee Albee, M.D.       Code Status: Full  Disposition Plan: To skilled nursing facility in the next 24-48 hours  Subjective: Patient is doing better. She denies any chest pain, shortness breath, nausea, vomiting, diarrhea, abdominal pain, headache, visual changes. She is working with physical therapy. She denies any dysuria or hematuria.  Objective: Filed Vitals:   10/31/11 0249 10/31/11 0529 10/31/11 1000 10/31/11 1400  BP: 152/67 161/58 165/72 137/72  Pulse: 62 62 62 60  Temp: 97.9 F (36.6 C) 97.9 F (36.6 C) 97.6 F (36.4 C) 98 F (36.7 C)  TempSrc: Oral Oral Oral Oral  Resp: 18 18 17 18   Height:      Weight:      SpO2: 100% 100% 100% 100%    Intake/Output Summary (Last 24 hours) at 10/31/11 1501 Last data  filed at 10/31/11 0500  Gross per 24 hour  Intake    500 ml  Output      0 ml  Net    500 ml   Weight change:  Exam:   General:  Pt is alert, follows commands appropriately, not in acute distress  HEENT: No icterus, No thrush,  [place]/AT, poor dentition  Cardiovascular: Regular rate and rhythm, S1/S2, , no rubs, no gallops  Respiratory: Diminished breath sounds at the bases. Scatter rales without wheezes. Good air movement  Abdomen: Soft, non tender, non distended, bowel sounds present, no guarding  Extremities: No edema, no rashes, petechia, lymphangitis  Data Reviewed: Basic Metabolic Panel:  Lab 10/31/11 9604 10/29/11 0645 10/28/11 1421 10/28/11 0835  NA 135 125* 116* 112*  K 3.3* 3.4* 3.2* 3.0*  CL 99 91* 77* 73*  CO2 27 23 28 25   GLUCOSE 93 88 88 112*  BUN 7 14 23  26*  CREATININE 0.73 0.85 1.18* 1.39*  CALCIUM 9.1 8.7 9.6 9.8  MG -- -- -- --  PHOS -- -- -- --   Liver Function Tests: No results found for this basename: AST:5,ALT:5,ALKPHOS:5,BILITOT:5,PROT:5,ALBUMIN:5 in the last 168 hours No results found for this basename: LIPASE:5,AMYLASE:5 in the last 168 hours No results found for this basename: AMMONIA:5 in the last 168 hours CBC:  Lab 10/31/11 0735 10/29/11 0645 10/28/11 0835  WBC 12.1* 9.1 10.6*  NEUTROABS -- -- --  HGB 11.7* 12.2 15.4*  HCT 32.3* 33.9* 40.2  MCV 95.6 93.4 91.0  PLT 289 246 242   Cardiac Enzymes:  Lab 10/28/11 0835  CKTOTAL --  CKMB --  CKMBINDEX --  TROPONINI <0.30   BNP: No components found with this basename: POCBNP:5 CBG: No results found for this basename: GLUCAP:5 in the last 168 hours  Recent Results (from the past 240 hour(s))  MRSA PCR SCREENING     Status: Abnormal   Collection Time   10/28/11  1:42 PM      Component Value Range Status Comment   MRSA by PCR POSITIVE (*) NEGATIVE Final      Scheduled Meds:   . Chlorhexidine Gluconate Cloth  6 each Topical Q0600  . levothyroxine  100 mcg Oral Daily  .  lisinopril  20 mg Oral Daily  . mupirocin ointment  1 application Nasal BID  . simvastatin  40 mg Oral QHS   Continuous Infusions:   . sodium chloride 50 mL/hr at 10/31/11 0140     Xian Alves, DO  [facility] Pager [phone]  If 7PM-7AM, please contact night-coverage [email or web address] Password [email or web address] 10/31/2011, 3:01 PM   LOS: 3 days

## 2011-10-31 NOTE — Progress Notes (Signed)
Physical Therapy Treatment Patient Details Name: Emma Douglas MRN: [id number] DOB: Aug 15, 1940 Today's Date: 10/31/2011 Time: 4098-1191 PT Time Calculation (min): 17 min  PT Assessment / Plan / Recommendation Comments on Treatment Session  Patient very motivated to participate in ambulation however limited overall by fatique. Encouraged patient to sit up in recliner for lunch    Follow Up Recommendations  Skilled nursing facility;Supervision/Assistance - 24 hour    Barriers to Discharge        Equipment Recommendations  Defer to next venue    Recommendations for Other Services    Frequency Min 4X/week   Plan Discharge plan remains appropriate;Frequency remains appropriate    Precautions / Restrictions     Pertinent Vitals/Pain     Mobility  Bed Mobility Supine to Sit: 5: Supervision Sitting - Scoot to Edge of Bed: 5: Supervision Transfers Sit to Stand: 4: Min guard;With upper extremity assist;From bed Stand to Sit: 4: Min guard;With upper extremity assist;With armrests;To chair/3-in-1 Details for Transfer Assistance: Stood x 5 for endurance and stregthening. Cues for positioning prior to stand Ambulation/Gait Ambulation/Gait Assistance: 4: Min assist Ambulation Distance (Feet): 100 Feet Assistive device: Rolling walker Ambulation/Gait Assistance Details: Patient with decreased endurance however no LOB with ambulation this session. Patient required constant reminders to stand close within RW Gait Pattern: Step-through pattern;Decreased stride length;Narrow base of support;Trunk flexed    Exercises General Exercises - Lower Extremity Long Arc Quad: AROM;Strengthening;Both;10 reps Hip Flexion/Marching: AROM;Strengthening;Both;10 reps   PT Diagnosis:    PT Problem List:   PT Treatment Interventions:     PT Goals Acute Rehab PT Goals PT Goal: Supine/Side to Sit - Progress: Progressing toward goal PT Goal: Sit to Stand - Progress: Progressing toward goal PT Goal:  Stand to Sit - Progress: Progressing toward goal PT Transfer Goal: Bed to Chair/Chair to Bed - Progress: Progressing toward goal PT Goal: Ambulate - Progress: Progressing toward goal  Visit Information  Last PT Received On: 10/31/11 Assistance Needed: +1    Subjective Data      Cognition  Overall Cognitive Status: Appears within functional limits for tasks assessed/performed Arousal/Alertness: Awake/alert Orientation Level: Appears intact for tasks assessed Behavior During Session: [facility] for tasks performed    Balance     End of Session PT - End of Session Equipment Utilized During Treatment: Gait belt Activity Tolerance: Patient tolerated treatment well;Patient limited by fatigue Patient left: in chair;with call bell/phone within reach;with family/visitor present Nurse Communication: Mobility status   GP     Fredrich Birks 10/31/2011, 2:24 PM 10/31/2011 Fredrich Birks PTA [phone] pager [phone] office  '

## 2011-10-31 NOTE — Clinical Social Work Note (Signed)
CSW met with pt and daughter to provide bed offers. Pt and daughter chose [facility]. CSW contacted facility to confirm bed availability. Awaiting PASARR. MD is aware. CSW will continue to follow to facilitate discharge to [facility].   Dede Query, MSW, Theresia Majors [phone]

## 2011-11-01 DIAGNOSIS — R9431 Abnormal electrocardiogram [ECG] [EKG]: Secondary | ICD-10-CM

## 2011-11-01 DIAGNOSIS — E876 Hypokalemia: Secondary | ICD-10-CM

## 2011-11-01 DIAGNOSIS — I959 Hypotension, unspecified: Secondary | ICD-10-CM

## 2011-11-01 DIAGNOSIS — N179 Acute kidney failure, unspecified: Secondary | ICD-10-CM

## 2011-11-01 LAB — CBC
HCT: 33.9 % — ABNORMAL LOW (ref 36.0–46.0)
MCH: 34.2 pg — ABNORMAL HIGH (ref 26.0–34.0)
MCHC: 35.7 g/dL (ref 30.0–36.0)
RDW: 13.2 % (ref 11.5–15.5)

## 2011-11-01 LAB — BASIC METABOLIC PANEL
BUN: 6 mg/dL (ref 6–23)
Chloride: 96 mEq/L (ref 96–112)
Creatinine, Ser: 0.66 mg/dL (ref 0.50–1.10)
GFR calc Af Amer: >90 mL/min (ref >90)
GFR calc non Af Amer: 86 mL/min — ABNORMAL LOW (ref >90)
Glucose, Bld: 93 mg/dL (ref 70–99)

## 2011-11-01 MED ORDER — LISINOPRIL 20 MG PO TABS: 20.0000 mg | ORAL_TABLET | Freq: Every day | ORAL | Status: DC

## 2011-11-01 MED ORDER — POTASSIUM CHLORIDE CRYS ER 20 MEQ PO TBCR
40.0000 meq | EXTENDED_RELEASE_TABLET | Freq: Once | ORAL | Status: DC
  Filled 2011-11-01: qty 2

## 2011-11-01 NOTE — Discharge Summary (Signed)
Physician Discharge Summary  Patient ID: Emma Douglas MRN: [id number] DOB/AGE: [birth year] 71 y.o.  Admit date: 10/28/2011 Discharge date: 11/01/2011  Primary Care Physician:  Sheila Oats, MD  Discharge Diagnoses:   Present on Admission:  .Hypothyroidism .Hyponatremia .Hypokalemia .Acute encephalopathy .Dehydration Leukocytosis: unclear etiology, no infectious source identified   Consults:  1) Patient was admitted by pulmonary critical care service on 10/28/2011, tried hospitalist service assumed care on 10/30/2011                     2) Dr Harriette Bouillon, trauma service   Discharge Medications: Medication List  As of 11/01/2011 10:48 AM   STOP taking these medications         lisinopril-hydrochlorothiazide 20-12.5 MG per tablet         TAKE these medications         levothyroxine 100 MCG tablet   Commonly known as: SYNTHROID, LEVOTHROID   Take 1 tablet (100 mcg total) by mouth daily.      lisinopril 20 MG tablet   Commonly known as: PRINIVIL,ZESTRIL   Take 1 tablet (20 mg total) by mouth daily.      simvastatin 40 MG tablet   Commonly known as: ZOCOR   Take 1 tablet (40 mg total) by mouth at bedtime.             Brief H and P: For complete details please refer to admission H and P, but in brief, patient is a 71 year old female who presented to the ER on 10/28/2011 after being found by her neighbor on the floor. Patient stated that she was having difficulty with balance in days leading up to the presentation, denied prior falls. Patient is a poor historian however did report poor by mouth intake and weight loss but unable to elaborate on how long that has been going on. ER evaluation demonstrated small bilateral subdural hematoma, marked hyponatremia with sodium of 112, hypokalemia and renal insufficiency. Her films were evaluated by neurosurgery and was not felt to require surgical intervention. Patient was admitted by pulmonary critical care service.    Hospital Course:  71 year old female who presented to the ER on 8/24 after being found by her neighbor on the floor, ER evaluation demonstrated small bilateral SDH, marked Hyponatremia, and renal insufficiency. Her films were evaluated by Neuro-surgery. She was not felt to require surgical intervention. Patient was admitted to ICU.  Traumatic bilateral SDH: Per the ED cleanout, neurosurgery was consulted in ED who reviewed the films and recommended no surgical intervention. Patient did improve clinically. CT head was repeated on 10/29/2011 which showed no changes in SDH. PT OT consult was obtained and recommended skilled nursing facility for 24/7 supervision.   Hypovolemia and hypovolemic shock / Dehydration, hyponatremia, Na 112 and acute renal insufficiency: Likely secondary to hydrochlorothiazide and dehydration. Patient was admitted to ICU and was placed on aggressive IV fluid hydration. The metabolic abnormalities resolved with volume resuscitation, Na at discharge is 133, K is 4.0 and Cr has improved to 0.6. HCTZ was discontinued.   Acute renal failure  Renal function has now normalized - was due to prerenal azotemia   Hypothyroidism: TSH at goal 1.1  HTN; stable, continue lisinopril.   Leukocytosis: No clear evidence of infection, chest x-ray was negative UA showed no UTI patient had no signs of any infectious source.  Day of Discharge BP 137/69  Pulse 75  Temp 98 F (36.7 C) (Oral)  Resp 18  Ht 5\' 8"  (  1.727 m)  Wt 62.2 kg (137 lb 2 oz)  BMI 20.85 kg/m2  SpO2 98%  Physical Exam: General: Alert and awake, not in any acute distress. HEENT: anicteric sclera, pupils reactive to light and accommodation CVS: S1-S2 clear no murmur rubs or gallops Chest: clear to auscultation bilaterally, no wheezing rales or rhonchi Abdomen: soft nontender, nondistended, normal bowel sounds, no organomegaly Extremities: no cyanosis, clubbing or edema noted bilaterally    The results of  significant diagnostics from this hospitalization (including imaging, microbiology, ancillary and laboratory) are listed below for reference.    LAB RESULTS: Basic Metabolic Panel:  Lab 11/01/11 1610 10/31/11 0735  NA 133* 135  K 4.0 3.3*  CL 96 99  CO2 27 27  GLUCOSE 93 93  BUN 6 7  CREATININE 0.66 0.73  CALCIUM 9.8 9.1  MG -- --  PHOS -- --   CBC:  Lab 11/01/11 0640 10/31/11 0735  WBC 13.8* 12.1*  NEUTROABS -- --  HGB 12.1 11.7*  HCT 33.9* 32.3*  MCV 95.8 --  PLT 343 289   Cardiac Enzymes:  Lab 10/28/11 0835  CKTOTAL --  CKMB --  CKMBINDEX --  TROPONINI <0.30    Significant Diagnostic Studies:  Ct Head Wo Contrast  10/29/2011  *RADIOLOGY REPORT*  Clinical Data: Fall 2 weeks ago, follow up subdural/subarachnoid hemorrhage  CT HEAD WITHOUT CONTRAST  Technique:  Contiguous axial images were obtained from the base of the skull through the vertex without contrast.  Comparison: 10/28/2011  Findings: Motion degraded images.  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  No acute/subacute left subdural hematoma overlying the left frontal convexity, measuring 7 mm on the current study, likely unchanged.  Additional subdural blood along the tentorium (series 2/image 15).  Small amount of subarachnoid hemorrhage is present in the right posterior parietal lobe (series 3/image 2) and also in the parasagittal right frontal lobe (series 3/image 5).  Mild cortical atrophy.  No ventriculomegaly.  Subcortical white matter and periventricular small vessel ischemic changes.  Intracranial atherosclerosis.  Mucous retention cyst in the left maxillary sinus.  The visualized paranasal sinuses and mastoid air cells are otherwise clear.  No fracture or dislocation is seen.  IMPRESSION: Stable bifrontal subdural collections, as described above.  Additional subarachnoid/subdural hemorrhage are also essentially  unchanged.   Original Report Authenticated By: Charline Bills, M.D.    Ct Head Wo Contrast  10/28/2011  *RADIOLOGY REPORT*  Clinical Data: Confusion/altered mental status, fall 2 weeks ago  CT HEAD WITHOUT CONTRAST  Technique:  Contiguous axial images were obtained from the base of the skull through the vertex without contrast.  Comparison: None.  Findings: Subdural hematomas overlying the bilateral frontal convexities.  The left subdural collection appears acute/subacute and measures 6 mm in thickness.  The right subdural collection appears chronic and may reflect a hygroma, also measuring 6 mm in thickness.  Associated subdural blood along the tentorium. Additional subarachnoid hemorrhage in the right parietal occipital region (for example, series 2/image 19).  No definite parenchymal hemorrhage.  No mass lesion, mass effect, or midline shift.  No CT evidence of acute infarction.  Subcortical white matter and periventricular small vessel ischemic changes.  Intracranial atherosclerosis.  Mucous retention cyst in the left maxillary sinus.  The mastoid air cells are clear.  No evidence of calvarial fracture.  IMPRESSION: Acute/subacute left subdural hematoma and chronic right subdural hematoma/hygroma, as described above.  Additional associated subdural/subarachnoid hemorrhage posteriorly,  as described above.  These results were called by telephone on 10/28/2011 at 0937 hours to Dr. Raeford Razor, who verbally acknowledged these results.   Original Report Authenticated By: Charline Bills, M.D.    Ct Cervical Spine Wo Contrast  10/28/2011  *RADIOLOGY REPORT*  Clinical Data: Altered mental status  CT CERVICAL SPINE WITHOUT CONTRAST  Technique:  Multidetector CT imaging of the cervical spine was performed. Multiplanar CT image reconstructions were also generated.  Comparison: None  Findings:  Straightening of normal cervical lordosis.  Vertebral body heights are well preserved.  There is disc space narrowing  and ventral endplate spurring at C4-5 and C5-6.  Facet joints are all well aligned.  The prevertebral soft tissue space is within normal limits.  There is no fracture or subluxation identified.  IMPRESSION:  1.  No acute findings.  2.  Straightening of normal cervical lordosis which may reflect muscle spasm or patient positioning. 3.  Cervical spondylosis   Original Report Authenticated By: Rosealee Albee, M.D.    Dg Chest Port 1 View  10/28/2011  *RADIOLOGY REPORT*  Clinical Data: COPD.  Smoker.  PORTABLE CHEST - 1 VIEW  Comparison: None  Findings: Heart size is normal.  There is no pleural effusion identified.  Atelectasis is noted within both lung bases.  No airspace consolidation.  IMPRESSION:  1.  Bibasilar atelectasis.   Original Report Authenticated By: Rosealee Albee, M.D.      Disposition and Follow-up: Discharge Orders    Future Orders Please Complete By Expires   Increase activity slowly          DISPOSITION: Skilled nursing facility  DIET: Heart healthy diet  ACTIVITY: As tolerated, FALL precautions  DISCHARGE FOLLOW-UP Follow-up Information    Follow up with with facility MD. Schedule an appointment as soon as possible for a visit in 7 days. (for hospital follow-up, check BMET in a week. )          Time spent on Discharge: 45 minutes  Signed:   Jeremie Abdelaziz M.D. [facility] 11/01/2011, 10:48 AM Pager: [phone]

## 2011-11-01 NOTE — Clinical Social Work Note (Signed)
Pt is ready for discharge to [facility]. Facility has received discharge summary and is ready to accept pt. Pt and family are agreeable to discharge plan. [facility] will provide transportation to facility. CSW is signing off as no further needs identified.   Dede Query, MSW, Theresia Majors [phone]

## 2011-11-01 NOTE — Progress Notes (Signed)
Physical Therapy Treatment Patient Details Name: Emma Douglas MRN: [id number] DOB: [birth year] Today's Date: 11/01/2011 Time: 7829-5621 PT Time Calculation (min): 12 min  PT Assessment / Plan / Recommendation Comments on Treatment Session  Patient daughter present this session and had just ambulated with patient. Patient lives alone and will need SNF for rehab to regain independence with functional activity. Patient progressng today without increased fatique.     Follow Up Recommendations  Skilled nursing facility;Supervision/Assistance - 24 hour    Barriers to Discharge        Equipment Recommendations  Defer to next venue    Recommendations for Other Services    Frequency Min 4X/week   Plan Discharge plan remains appropriate;Frequency remains appropriate    Precautions / Restrictions Precautions Precautions: Fall   Pertinent Vitals/Pain     Mobility  Transfers Sit to Stand: 4: Min guard;Without upper extremity assist;From bed Stand to Sit: 4: Min guard;Without upper extremity assist;To bed Details for Transfer Assistance: Patient stood x10 working on not bracing LEs on bed for support and placing hands on knees to work on strengthening of LEs Ambulation/Gait Ambulation/Gait Assistance: 4: Min Government social research officer (Feet): 300 Feet Assistive device: Rolling walker Ambulation/Gait Assistance Details: Cues for positioning inside of RW and to stand upright. Patient with slight adduction of right LE with stepping, especially with turns. No LOB but times of staggering with self correction Gait Pattern: Step-through pattern;Decreased stride length;Scissoring    Exercises     PT Diagnosis:    PT Problem List:   PT Treatment Interventions:     PT Goals Acute Rehab PT Goals PT Goal: Sit to Stand - Progress: Progressing toward goal PT Goal: Stand to Sit - Progress: Progressing toward goal PT Transfer Goal: Bed to Chair/Chair to Bed - Progress: Progressing toward  goal PT Goal: Ambulate - Progress: Progressing toward goal  Visit Information  Last PT Received On: 11/01/11 Assistance Needed: +1    Subjective Data      Cognition  Overall Cognitive Status: Appears within functional limits for tasks assessed/performed Arousal/Alertness: Awake/alert Orientation Level: Appears intact for tasks assessed Behavior During Session: [facility] for tasks performed    Balance     End of Session PT - End of Session Equipment Utilized During Treatment: Gait belt Activity Tolerance: Patient tolerated treatment well Patient left: with call bell/phone within reach;in bed;with family/visitor present Nurse Communication: Mobility status   GP     Fredrich Birks 11/01/2011, 12:02 PM 11/01/2011 Fredrich Birks PTA [phone] pager [phone] office

## 2011-11-01 NOTE — Progress Notes (Signed)
Report called to nurse Silvio Pate at [facility]. All questions answered to satisfaction. Pt D/C to [facility] via [facility] with no signs of acute distress.

## 2011-11-02 NOTE — Care Management Note (Signed)
    Page 1 of 1   11/02/2011     11:46:11 AM   CARE MANAGEMENT NOTE 11/02/2011  Patient:  Emma Douglas, Emma Douglas   Account Number:  [id number]  Date Initiated:  10/30/2011  Documentation initiated by:  [facility]  Subjective/Objective Assessment:   Admitted with bilateral SDH after a fall at home.     Action/Plan:   PT/Ot evals-recommending SNF   Anticipated DC Date:  11/02/2011   Anticipated DC Plan:  SKILLED NURSING FACILITY  In-house referral  Clinical Social Worker         Choice offered to / List presented to:             Status of service:  Completed, signed off Medicare Important Message given?   (If response is "NO", the following Medicare IM given date fields will be blank) Date Medicare IM given:   Date Additional Medicare IM given:    Discharge Disposition:  SKILLED NURSING FACILITY  Per UR Regulation:  Reviewed for med. necessity/level of care/duration of stay  If discussed at Long Length of Stay Meetings, dates discussed:    Comments:  11/02/11 Onnie Boer, RN, BSN 1145 PT WAS DC'D TO [facility] ON YESTERDAY.

## 2011-12-23 ENCOUNTER — Other Ambulatory Visit: Payer: Self-pay | Admitting: Cardiology

## 2012-03-08 ENCOUNTER — Other Ambulatory Visit: Payer: Self-pay

## 2012-03-08 MED ORDER — LISINOPRIL 20 MG PO TABS: 20.0000 mg | ORAL_TABLET | Freq: Every day | ORAL | Status: DC

## 2012-03-08 MED ORDER — SIMVASTATIN 40 MG PO TABS: 40.0000 mg | ORAL_TABLET | Freq: Every day | ORAL | Status: DC

## 2012-03-22 ENCOUNTER — Telehealth: Payer: Self-pay | Admitting: *Deleted

## 2012-03-22 MED ORDER — LEVOTHYROXINE SODIUM 100 MCG PO TABS: 100.0000 ug | ORAL_TABLET | Freq: Every day | ORAL | Status: DC

## 2012-03-22 MED ORDER — LISINOPRIL 20 MG PO TABS: 20.0000 mg | ORAL_TABLET | Freq: Every day | ORAL | Status: DC

## 2012-03-22 MED ORDER — SIMVASTATIN 40 MG PO TABS: 40.0000 mg | ORAL_TABLET | Freq: Every day | ORAL | Status: DC

## 2012-03-22 NOTE — Telephone Encounter (Signed)
Pt also wants synthroid.

## 2012-03-22 NOTE — Telephone Encounter (Signed)
Refilled medication

## 2012-04-10 ENCOUNTER — Encounter: Payer: Self-pay | Admitting: Cardiology

## 2012-04-10 ENCOUNTER — Ambulatory Visit (INDEPENDENT_AMBULATORY_CARE_PROVIDER_SITE_OTHER): Payer: Medicare Other | Admitting: Cardiology

## 2012-04-10 VITALS — BP 144/70 | HR 81 | Ht 68.0 in | Wt 130.0 lb

## 2012-04-10 DIAGNOSIS — E785 Hyperlipidemia, unspecified: Secondary | ICD-10-CM

## 2012-04-10 DIAGNOSIS — I119 Hypertensive heart disease without heart failure: Secondary | ICD-10-CM

## 2012-04-10 DIAGNOSIS — E7800 Pure hypercholesterolemia, unspecified: Secondary | ICD-10-CM

## 2012-04-10 DIAGNOSIS — E039 Hypothyroidism, unspecified: Secondary | ICD-10-CM

## 2012-04-10 LAB — HEPATIC FUNCTION PANEL
ALT: 24 U/L (ref 0–35)
AST: 27 U/L (ref 0–37)
Alkaline Phosphatase: 99 U/L (ref 39–117)
Total Bilirubin: 0.5 mg/dL (ref 0.3–1.2)

## 2012-04-10 LAB — LIPID PANEL
HDL: 67.2 mg/dL (ref >39.00)
Total CHOL/HDL Ratio: 3

## 2012-04-10 LAB — BASIC METABOLIC PANEL
BUN: 11 mg/dL (ref 6–23)
CO2: 29 mEq/L (ref 19–32)
Calcium: 10.1 mg/dL (ref 8.4–10.5)
Creatinine, Ser: 0.7 mg/dL (ref 0.4–1.2)
GFR: 98.99 mL/min (ref >60.00)
Glucose, Bld: 71 mg/dL (ref 70–99)

## 2012-04-10 LAB — TSH: TSH: 0.13 u[IU]/mL — ABNORMAL LOW (ref 0.35–5.50)

## 2012-04-10 NOTE — Assessment & Plan Note (Signed)
Patient remains on simvastatin.  We are updating her labs today.  She's not having any myalgias

## 2012-04-10 NOTE — Progress Notes (Signed)
Quick Note:  Please report to patient. The recent labs are stable. Continue same medication and careful diet. Continue same synthroid. Add FT4 to next labs. ______

## 2012-04-10 NOTE — Assessment & Plan Note (Signed)
The patient denies any symptoms to suggest congestive heart failure.  No dizziness or syncope.  She has a chronic cough secondary to her smoking and to sinus drainage

## 2012-04-10 NOTE — Progress Notes (Signed)
Emma Douglas Date of Birth:  [birth year] [facility] [address] [place], [place]  [address] [phone]  Fax   [phone]  HPI: This pleasant 72 year old woman is seen for a scheduled followup office visit. She has a past history of hypercholesterolemia, essential hypertension and hypothyroidism. She has a remote history of a large pericardial effusion which occurred when she was markedly hypothyroid. The pericardial effusion resolved when her thyroid status returned to normal. Her last echocardiogram was 08/10/09 and showed normal systolic function with ejection fraction 55-60% and with impaired relaxation. She has mild aortic sclerosis and mild mitral regurgitation and normal pulmonary artery pressure.  Since last visit she's been doing well with no new cardiac symptoms.  He continues to smoke against advice.   Current Outpatient Prescriptions  Medication Sig Dispense Refill  . levothyroxine (SYNTHROID) 100 MCG tablet Take 1 tablet (100 mcg total) by mouth daily.  30 tablet  0  . lisinopril (PRINIVIL,ZESTRIL) 20 MG tablet Take 1 tablet (20 mg total) by mouth daily.  30 tablet  0  . simvastatin (ZOCOR) 40 MG tablet Take 1 tablet (40 mg total) by mouth at bedtime.  30 tablet  0    No Known Allergies  Patient Active Problem List  Diagnosis  . Benign hypertensive heart disease without heart failure  . Hypercholesterolemia  . Hypothyroidism  . Hyponatremia  . Hypokalemia  . Acute encephalopathy  . Dehydration  . Hypotension  . Subdural hematoma, post-traumatic  . Abnormal EKG  . Tobacco abuse  . AKI (acute kidney injury)    History  Smoking status  . Current Some Day Smoker  . Types: Cigarettes  Smokeless tobacco  . Not on file    History  Alcohol Use No    Family History  Problem Relation Age of Onset  . Heart attack Mother   . Heart attack Father     Review of Systems: The patient denies any heat or cold intolerance.  No weight  gain or weight loss.  The patient denies headaches or blurry vision.  There is no cough or sputum production.  The patient denies dizziness.  There is no hematuria or hematochezia.  The patient denies any muscle aches or arthritis.  The patient denies any rash.  The patient denies frequent falling or instability.  There is no history of depression or anxiety.  All other systems were reviewed and are negative.   Physical Exam: Filed Vitals:   04/10/12 1114  BP: 144/70  Pulse: 81   the general appearance reveals a thin elderly woman in no distress.  She has actually lost 16 pounds since we last saw her more than a year ago.  She attributes the weight loss to the fact that she had been under a lot of stress when she had a lady living in her house with her and there is a lot of tension.  The woman has subsequently moved out and the patient is feeling much better herself.The head and neck exam reveals pupils equal and reactive.  Extraocular movements are full.  There is no scleral icterus.  The mouth and pharynx are normal.  The neck is supple.  The carotids reveal no bruits.  The jugular venous pressure is normal.  The  thyroid is not enlarged.  There is no lymphadenopathy.  The chest is clear to percussion and auscultation.  There are no rales or rhonchi.  Expansion of the chest is symmetrical.  The precordium is quiet.  The first  heart sound is normal.  The second heart sound is physiologically split.  There is no murmur gallop rub or click.  There is no abnormal lift or heave.  The abdomen is soft and nontender.  The bowel sounds are normal.  The liver and spleen are not enlarged.  There are no abdominal masses.  There are no abdominal bruits.  Extremities reveal good pedal pulses.  There is no phlebitis or edema.  There is no cyanosis or clubbing.  Strength is normal and symmetrical in all extremities.  There is no lateralizing weakness.  There are no sensory deficits.  The skin is warm and dry.  There is no  rash.      Assessment / Plan: Continue same medication.  Recheck in 6 months for followup office visit and fasting lab work and EKG

## 2012-04-10 NOTE — Patient Instructions (Addendum)
Will obtain labs today and call you with the results (lp/bmet/hfp/tsh)  Your physician recommends that you continue on your current medications as directed. Please refer to the Current Medication list given to you today.  Your physician wants you to follow-up in: 6 months with fasting labs (lp/bmet/hfp)  You will receive a reminder letter in the mail two months in advance. If you don't receive a letter, please call our office to schedule the follow-up appointment.

## 2012-05-03 ENCOUNTER — Other Ambulatory Visit: Payer: Self-pay | Admitting: *Deleted

## 2012-05-03 MED ORDER — LISINOPRIL 20 MG PO TABS: 20.0000 mg | ORAL_TABLET | Freq: Every day | ORAL | Status: DC

## 2012-05-03 MED ORDER — SIMVASTATIN 40 MG PO TABS: 40.0000 mg | ORAL_TABLET | Freq: Every day | ORAL | Status: DC

## 2012-05-03 MED ORDER — LEVOTHYROXINE SODIUM 100 MCG PO TABS: 100.0000 ug | ORAL_TABLET | Freq: Every day | ORAL | Status: DC

## 2012-05-08 ENCOUNTER — Other Ambulatory Visit: Payer: Self-pay | Admitting: *Deleted

## 2012-05-08 MED ORDER — LEVOTHYROXINE SODIUM 100 MCG PO TABS: 100.0000 ug | ORAL_TABLET | Freq: Every day | ORAL | Status: DC

## 2012-07-08 ENCOUNTER — Other Ambulatory Visit: Payer: Medicare Other

## 2012-09-03 ENCOUNTER — Other Ambulatory Visit: Payer: Self-pay | Admitting: Cardiology

## 2012-09-03 DIAGNOSIS — E039 Hypothyroidism, unspecified: Secondary | ICD-10-CM

## 2012-10-08 ENCOUNTER — Encounter: Payer: Self-pay | Admitting: Cardiology

## 2012-10-08 ENCOUNTER — Ambulatory Visit (INDEPENDENT_AMBULATORY_CARE_PROVIDER_SITE_OTHER): Payer: Medicare Other | Admitting: Cardiology

## 2012-10-08 VITALS — BP 134/78 | HR 72 | Ht 68.0 in | Wt 125.8 lb

## 2012-10-08 DIAGNOSIS — I119 Hypertensive heart disease without heart failure: Secondary | ICD-10-CM

## 2012-10-08 DIAGNOSIS — E7800 Pure hypercholesterolemia, unspecified: Secondary | ICD-10-CM

## 2012-10-08 DIAGNOSIS — E039 Hypothyroidism, unspecified: Secondary | ICD-10-CM

## 2012-10-08 NOTE — Progress Notes (Signed)
Emma Douglas Date of Birth:  [birth year] [facility] [address] [place], [place]  [address] [phone]  Fax   [phone]  HPI: This pleasant 72 year old woman is seen for a scheduled followup office visit. She has a past history of hypercholesterolemia, essential hypertension and hypothyroidism. She has a remote history of a large pericardial effusion which occurred when she was markedly hypothyroid. The pericardial effusion resolved when her thyroid status returned to normal. Her last echocardiogram was 08/10/09 and showed normal systolic function with ejection fraction 55-60% and with impaired relaxation. She has mild aortic sclerosis and mild mitral regurgitation and normal pulmonary artery pressure. Since last visit she's been doing well with no new cardiac symptoms.  She continues to smoke against advice.  Today she comes in with severe swelling of her lips which she attributes to having eaten too many fresh tomatoes.  She states that cucumbers also cause this for her.   Current Outpatient Prescriptions  Medication Sig Dispense Refill  . levothyroxine (SYNTHROID, LEVOTHROID) 100 MCG tablet TAKE 1 TABLET BY MOUTH DAILY.  30 tablet  5  . lisinopril (PRINIVIL,ZESTRIL) 20 MG tablet TAKE 1 TABLET (20 MG TOTAL) BY MOUTH DAILY.  30 tablet  3  . simvastatin (ZOCOR) 40 MG tablet TAKE 1 TABLET BY MOUTH AT BEDTIME.  30 tablet  3   No current facility-administered medications for this visit.    No Known Allergies  Patient Active Problem List   Diagnosis Date Noted  . Abnormal EKG 10/31/2011  . Tobacco abuse 10/31/2011  . AKI (acute kidney injury) 10/31/2011  . Hyponatremia 10/28/2011  . Hypokalemia 10/28/2011  . Acute encephalopathy 10/28/2011  . Dehydration 10/28/2011  . Hypotension 10/28/2011  . Subdural hematoma, post-traumatic 10/28/2011  . Benign hypertensive heart disease without heart failure 01/13/2011  . Hypercholesterolemia 01/13/2011  .  Hypothyroidism 01/13/2011    History  Smoking status  . Current Some Day Smoker  . Types: Cigarettes  Smokeless tobacco  . Not on file    History  Alcohol Use No    Family History  Problem Relation Age of Onset  . Heart attack Mother   . Heart attack Father     Review of Systems: The patient denies any heat or cold intolerance.  No weight gain or weight loss.  The patient denies headaches or blurry vision.  There is no cough or sputum production.  The patient denies dizziness.  There is no hematuria or hematochezia.  The patient denies any muscle aches or arthritis.  The patient denies any rash.  The patient denies frequent falling or instability.  There is no history of depression or anxiety.  All other systems were reviewed and are negative.   Physical Exam: Filed Vitals:   10/08/12 1002  BP: 134/78  Pulse: 72   the general appearance reveals well-developed elderly woman in no distress.  She has significant swelling of her upper and lower lips.  She is in no respiratory distress.  She has a wet cough from smoking.The head and neck exam reveals pupils equal and reactive.  Extraocular movements are full.  There is no scleral icterus.  The pharynx is benign. The neck is supple.  The carotids reveal no bruits.  The jugular venous pressure is normal.  The  thyroid is not enlarged.  There is no lymphadenopathy.  The chest is clear to percussion and auscultation.  There are no rales or rhonchi.  Expansion of the chest is symmetrical.  The precordium is quiet.  The first heart sound is normal.  The second heart sound is physiologically split.  There is no murmur gallop rub or click.  There is no abnormal lift or heave.  The abdomen is soft and nontender.  The bowel sounds are normal.  The liver and spleen are not enlarged.  There are no abdominal masses.  There are no abdominal bruits.  Extremities reveal good pedal pulses.  There is no phlebitis or edema.  There is no cyanosis or clubbing.   Strength is normal and symmetrical in all extremities.  There is no lateralizing weakness.  There are no sensory deficits.  The skin is warm and dry.  There is no rash.  EKG shows normal sinus rhythm and left anterior hemiblock and nonspecific T-wave changes    Assessment / Plan: Continue on same medication.  Avoid fresh tomatoes in the future.  Trial of Benadryl for symptomatic reduction of lip swelling.  Recheck in 6 months for followup office visit and fasting lab work including lipid panel, hepatic function panel basal metabolic panel TSH and free T4.  Counseled on smoking cessation.

## 2012-10-08 NOTE — Assessment & Plan Note (Signed)
Clinically she is euthyroid.  We will plan to recheck thyroid function at her next visit.  Her EKG today shows normal voltage and no ischemic changes.

## 2012-10-08 NOTE — Patient Instructions (Addendum)
Your physician wants you to follow-up in: 6 MONTHS.  You will receive a reminder letter in the mail two months in advance. If you don't receive a letter, please call our office to schedule the follow-up appointment.  Your physician recommends that you continue on your current medications as directed. Please refer to the Current Medication list given to you today.  

## 2012-10-08 NOTE — Assessment & Plan Note (Signed)
She has a history of high blood pressure and has been on long-term lisinopril with good results.  Today's lip swelling has been felt by patient to be secondary to tomatoes.  Lisinopril allergy would also be a consideration but less likely.  We will recommend that she take some Benadryl for the swelling and to avoid fresh tomatoes in the future.  If swelling recurs we would need to reconsider the possible role of lisinopril.

## 2012-10-08 NOTE — Assessment & Plan Note (Signed)
Patient has a history of hypercholesterolemia.  She is on simvastatin 40 mg daily.  She's not having any myalgias.  We will plan to check fasting lab work at her next visit

## 2012-10-24 ENCOUNTER — Encounter: Payer: Self-pay | Admitting: Physician Assistant

## 2012-10-24 ENCOUNTER — Ambulatory Visit (INDEPENDENT_AMBULATORY_CARE_PROVIDER_SITE_OTHER): Payer: Medicare Other | Admitting: Physician Assistant

## 2012-10-24 VITALS — BP 82/50 | HR 96 | Temp 97.7°F | Resp 18 | Ht 67.75 in | Wt 122.0 lb

## 2012-10-24 DIAGNOSIS — T7840XA Allergy, unspecified, initial encounter: Secondary | ICD-10-CM

## 2012-10-24 HISTORY — DX: Allergy, unspecified, initial encounter: T78.40XA

## 2012-10-24 MED ORDER — EPINEPHRINE 0.3 MG/0.3ML IJ SOAJ: 0.3000 mg | Freq: Once | INTRAMUSCULAR | Status: AC

## 2012-10-24 NOTE — Progress Notes (Signed)
Patient ID: Emma Douglas MRN: [id number], DOB: [birth year], 72 y.o. Date of Encounter: 10/24/2012, 9:48 AM    Chief Complaint:  Chief Complaint  Patient presents with  . swelling in face     HPI: 72 y.o. year old AA female reports that she has recently been having episodes where her right cheek and lips swell. She says this has occurred after she eats tomatoes cucumbers and green peppers. Since she has stopped eating these 3 foods she has had no problem with the swelling.as well in the past when she was having this swelling she took Benadryl and the swelling resolved quickly. She says she's never had any episodes for her throat begins to swell or feel as if it's closing. She had never had any itching sensation in the throat. As well no episodes of wheezing or feeling short of breath.  She says she has always enjoyed these foods all of her life and is upset that she has not developed this problem and can no longer eat these foods. She has never had any prior history of such types of allergic reactions. Says these episodes this began about 2 months ago.  Home Meds: See attached medication section for any medications that were entered at today's visit. The computer does not put those onto this list.The following list is a list of meds entered prior to today's visit.   Current Outpatient Prescriptions on File Prior to Visit  Medication Sig Dispense Refill  . levothyroxine (SYNTHROID, LEVOTHROID) 100 MCG tablet TAKE 1 TABLET BY MOUTH DAILY.  30 tablet  5  . lisinopril (PRINIVIL,ZESTRIL) 20 MG tablet TAKE 1 TABLET (20 MG TOTAL) BY MOUTH DAILY.  30 tablet  3  . simvastatin (ZOCOR) 40 MG tablet TAKE 1 TABLET BY MOUTH AT BEDTIME.  30 tablet  3   No current facility-administered medications on file prior to visit.    Allergies: No Known Allergies    Review of Systems: See HPI for pertinent ROS. All other ROS negative.    Physical Exam: Blood pressure 102/70, pulse 96, temperature 97.7  F (36.5 C), resp. rate 18, height 5' 7.75" (1.721 m), weight 122 lb (55.339 kg)., Body mass index is 18.68 kg/(m^2). General: l African American female. Appears in no acute distress. HEENT: Normocephalic, atraumatic, eyes without discharge, sclera non-icteric, nares are without discharge. Bilateral auditory canals clear, TM's are without perforation, pearly grey and translucent with reflective cone of light bilaterally. Oral cavity moist, posterior pharynx without exudate, erythema, peritonsillar abscess, or post nasal drip.there is no swelling of the face no swelling of the neck at present.  Neck: Supple. No thyromegaly. No lymphadenopathy.no swelling of the neck or throat. Lungs: Clear bilaterally to auscultation without wheezes, rales, or rhonchi. Breathing is unlabored.no wheezing Heart: Regular rhythm. No murmurs, rubs, or gallops. Msk:  Strength and tone normal for age. Extremities/Skin: Warm and dry.  No edema. No rashes or urticxaria. Neuro: Alert and oriented X 3. Moves all extremities spontaneously. Gait is normal. CNII-XII grossly in tact. Psych:  Responds to questions appropriately with a normal affect.     ASSESSMENT AND PLAN:  72 y.o. year old female with  1. Allergic reaction, initial encounter Discussed referral to an allergy specialist for allergy testing. However she refuses followup with this. She is to avoid eating any foods that contain tomatoes cucumbers or green peppers.  If she does develop any swelling or allergic reaction symptoms concerns in the future she is to take Benadryl immediately. If she develops  any swelling and tightness in her throat or any difficult breathing or wheezing or shortness of breath and she is to use the EpiPen as instructed. I have discussed to her how to use the EpiPen and keep it with her at all times. If she changes her mind and is agreeable to follow up with the allergist for allergy testing call me and I can do this referral at any  time. - EPINEPHrine (EPI-PEN) 0.3 mg/0.3 mL SOAJ injection; Inject 0.3 mLs (0.3 mg total) into the muscle once.  Dispense: 1 Device; Refill: 2   Signed, [address] [place], [place], [facility] 10/24/2012 9:48 AM

## 2013-01-06 ENCOUNTER — Other Ambulatory Visit: Payer: Self-pay | Admitting: Cardiology

## 2013-03-08 ENCOUNTER — Other Ambulatory Visit: Payer: Self-pay | Admitting: Cardiology

## 2013-04-07 ENCOUNTER — Other Ambulatory Visit: Payer: Medicare Other

## 2013-04-07 ENCOUNTER — Ambulatory Visit: Payer: Medicare Other | Admitting: Cardiology

## 2013-04-25 ENCOUNTER — Encounter: Payer: Self-pay | Admitting: Cardiology

## 2013-05-02 ENCOUNTER — Other Ambulatory Visit: Payer: Self-pay | Admitting: Cardiology

## 2013-06-04 ENCOUNTER — Other Ambulatory Visit: Payer: Self-pay | Admitting: Cardiology

## 2013-07-02 ENCOUNTER — Ambulatory Visit (INDEPENDENT_AMBULATORY_CARE_PROVIDER_SITE_OTHER): Payer: Medicare Other | Admitting: Physician Assistant

## 2013-07-02 ENCOUNTER — Encounter: Payer: Self-pay | Admitting: Physician Assistant

## 2013-07-02 VITALS — BP 92/40 | HR 84 | Temp 97.7°F | Resp 18 | Ht 67.0 in | Wt 104.0 lb

## 2013-07-02 DIAGNOSIS — E7800 Pure hypercholesterolemia, unspecified: Secondary | ICD-10-CM

## 2013-07-02 DIAGNOSIS — F17200 Nicotine dependence, unspecified, uncomplicated: Secondary | ICD-10-CM

## 2013-07-02 DIAGNOSIS — F1010 Alcohol abuse, uncomplicated: Secondary | ICD-10-CM

## 2013-07-02 DIAGNOSIS — I10 Essential (primary) hypertension: Secondary | ICD-10-CM | POA: Insufficient documentation

## 2013-07-02 DIAGNOSIS — E039 Hypothyroidism, unspecified: Secondary | ICD-10-CM

## 2013-07-02 DIAGNOSIS — Z720 Tobacco use: Secondary | ICD-10-CM

## 2013-07-02 DIAGNOSIS — R634 Abnormal weight loss: Secondary | ICD-10-CM

## 2013-07-02 NOTE — Progress Notes (Signed)
Patient ID: SAVEAH BAHAR MRN: [id number], DOB: 1940-12-25, 73 y.o. Date of Encounter: @DATE @  Chief Complaint:  Chief Complaint  Patient presents with  . c/o weak rt knee cap    daughter states chronic cough, weight loss, not eating well, daughter says she is a "drinker" but not since being at he house the last 5 days    HPI: 73 y.o. year old AA female  presents with granddaughter for her visit today.  Granddaughter reports that the patient has been living with her daughter (patient's daughter, who is mother of this granddaughter present today) and this granddaughter for the past 5 days. Prior to that, patient was living in her same house that she had always lived in which his right off of the exit from [address] from here. Granddaughter tells me that some female acquaintance was living at the patient's house for the past several months. She says that he is not the patient's "boyfriend". Granddaughter refers to him as "unwanted visitor." Grand daughter does tell me-- at the end of the visit,  after the patient is not in the room--- that they are aware that this man was doing cocaine.                                                  They do not know whether the patient has also been doing cocaine with him or not.  Granddaughter also  Mentions (while patient not in room)  that she knows that the patient has been drinking significant amount of alcohol.  [place] daughter also says that the patient had been smoking a lot of cigars. Says that she smoked 15 cigars in just 3 days because recently she had purchased them for her and then she  called her, wanting her to buy her some more.   I discussed with the patient and her granddaughter the fact that years ago I used to see the patient on a very routine basis. Actually reviewed her old paper chart. Saw her on a very regular routine basis from 2006 up until 09/11/2008. I was seeing her routinely for hypertension, hyperlipidemia,  hypothyroidism. As well we were keeping up with her preventive care.  At that time she will come in routinely for lab work and office visits.  However since the visit 09/11/08 she has only been seen in this office a couple of times and that has been for "sick visits".  I asked what  had happened --in general-- but no.no good response an answer.  Also noted to them that the patient looks like she has lost significant weight. Asked them over  what amount of time  they had noted that this weight loss has occurred--whether this has gradually occurred over years or whether it has been sudden. Patient does not give me much of any answer. The granddaughter says that she thinks that things were okay up through about Christmas time. Says that she has noticed all of these change since then.  However I reviewed weight from her paper chart and from Cardiology OV note in Epic.  09/11/2008-------- 170 pounds. 07/19/2011----------- 140 pounds. 10/08/2012 cardiology office visit note---  125 pounds. Today weight is 104.  I told them that I can see in the computer that she has had visits with cardiology. Asked whether she had seen any  other type of medical providers and they say no.   Past Medical History  Diagnosis Date  . Compensated hypothyroidism   . Essential hypertension   . Dyslipidemia   . Colon polyps   . Anxiety   . COPD (chronic obstructive pulmonary disease)     secondary to smoking.  . Avascular necrosis     right femoral head  . Hyperlipidemia   . Smoking history   . Anemia      Postoperative hemorrhagic anemia  . Allergic reaction 10/24/2012     Home Meds: See attached medication section for current medication list. Any medications entered into computer today will not appear on this note's list. The medications listed below were entered prior to today. Current Outpatient Prescriptions on File Prior to Visit  Medication Sig Dispense Refill  . EPINEPHrine (EPI-PEN) 0.3 mg/0.3 mL SOAJ  injection Inject 0.3 mLs (0.3 mg total) into the muscle once.  1 Device  2  . levothyroxine (SYNTHROID, LEVOTHROID) 100 MCG tablet TAKE 1 TABLET BY MOUTH DAILY.  30 tablet  5  . lisinopril (PRINIVIL,ZESTRIL) 20 MG tablet TAKE 1 TABLET BY MOUTH DAILY  30 tablet  0  . simvastatin (ZOCOR) 40 MG tablet TAKE 1 TABLET BY MOUTH AT BEDTIME.  30 tablet  0   No current facility-administered medications on file prior to visit.    Allergies: No Known Allergies  History   Social History  . Marital Status: Divorced    Spouse Name: N/A    Number of Children: N/A  . Years of Education: N/A   Occupational History  . Not on file.   Social History Main Topics  . Smoking status: Current Some Day Smoker -- 0.50 packs/day    Types: Cigarettes  . Smokeless tobacco: Never Used  . Alcohol Use: Yes  . Drug Use: No  . Sexual Activity: Yes   Other Topics Concern  . Not on file   Social History Narrative  . No narrative on file    Family History  Problem Relation Age of Onset  . Heart attack Mother   . Heart attack Father      Review of Systems:  See HPI for pertinent ROS. All other ROS negative.    Physical Exam: Blood pressure 92/40, pulse 84, temperature 97.7 F (36.5 C), temperature source Oral, resp. rate 18, height 5\' 7"  (1.702 m), weight 104 lb (47.174 kg), SpO2 99.00%., Body mass index is 16.28 kg/(m^2). General: Thin AAF. Appears in no acute distress. Neck: Supple. No thyromegaly. No lymphadenopathy. No carotid bruit. Lungs: Clear bilaterally to auscultation without wheezes, rales, or rhonchi. Breathing is unlabored. Heart: Reg Rhythm. II/VI murmur at apex. Abdomen: Soft, non-tender, non-distended with normoactive bowel sounds. No hepatomegaly. No rebound/guarding. No obvious abdominal masses. Musculoskeletal:  Strength and tone normal for age. Extremities/Skin: Warm and dry.  No edema. Neuro: Alert and oriented X 3. Moves all extremities spontaneously. Gait is normal. CNII-XII  grossly in tact. Psych:  Responds to questions appropriately with a normal affect.     ASSESSMENT AND PLAN:  73 y.o. year old female with  1. Unintentional weight loss - HIV antibody - RPR - CBC with Differential - COMPLETE METABOLIC PANEL WITH GFR - TSH - Drugs of abuse scrn w alc, routine urine  2. Hypertension - COMPLETE METABOLIC PANEL WITH GFR  3. Hypercholesterolemia - COMPLETE METABOLIC PANEL WITH GFR  4. Hypothyroidism - TSH  5. Tobacco abuse  6. Alcohol abuse, daily use  I reviewed in the computer  that her last labs were done in February 2014. Also reviewed that no HIV test etc. have been done / recorded in the Epic whatsoever all the way back to 2012.  I discussed with the granddaughter that it shows that the patient is taking 3 prescription medicines on a daily basis I want to make sure that she is actually taking these and he could not run out. She reassures me that she is taking all 3 of these medications as directed.  Today I did review notes by cardiology dated 10/08/12. Also today I did review discharge summary from hospitalization 11/01/11. At that time she had presented to the ER after being found by a neighbor on the floor. Even that Hospital note  did make mention of decreased intake and weight loss. During that hospitalization she had a head CT that showed subdural hematoma. Marked hyponatremia with sodium of 112. Also had hypokalemia and renal insufficiency which is secondary to prerenal azotemia. Those records indicated that the hyponatremia and hypokalemia likely secondary to hydrochlorothiazide and dehydration.  Discussed with the patient and the granddaughter that today we will obtain these labs. We'll have patient and granddaughter schedule a followup appointment with me in one week's time. As well, I asked   the granddaughter how long the patient will be staying with her and the patient's daughter.  [place] daughter states that 'the patient will be  staying with them until they get some answers and get things back to the way they were."  Noted that the patient is not fasting therefore not doing a lipid panel today. We'll need to check a lipid panel in the future once these issues are addressed.  F/U OV in one week.   Marin Olp [place], [place], [facility] 07/02/2013 2:01 PM

## 2013-07-03 LAB — CBC WITH DIFFERENTIAL/PLATELET
Basophils Absolute: 0.1 10*3/uL (ref 0.0–0.1)
Basophils Relative: 1 % (ref 0–1)
Eosinophils Absolute: 0.1 10*3/uL (ref 0.0–0.7)
Eosinophils Relative: 1 % (ref 0–5)
HCT: 37.5 % (ref 36.0–46.0)
HEMOGLOBIN: 13.4 g/dL (ref 12.0–15.0)
LYMPHS ABS: 2 10*3/uL (ref 0.7–4.0)
Lymphocytes Relative: 26 % (ref 12–46)
MCH: 33.3 pg (ref 26.0–34.0)
MCHC: 35.7 g/dL (ref 30.0–36.0)
MCV: 93.3 fL (ref 78.0–100.0)
MONOS PCT: 9 % (ref 3–12)
Monocytes Absolute: 0.7 10*3/uL (ref 0.1–1.0)
NEUTROS ABS: 4.8 10*3/uL (ref 1.7–7.7)
NEUTROS PCT: 63 % (ref 43–77)
Platelets: 314 10*3/uL (ref 150–400)
RBC: 4.02 MIL/uL (ref 3.87–5.11)
RDW: 13.8 % (ref 11.5–15.5)
WBC: 7.6 10*3/uL (ref 4.0–10.5)

## 2013-07-03 LAB — COMPLETE METABOLIC PANEL WITH GFR
ALBUMIN: 3.6 g/dL (ref 3.5–5.2)
ALK PHOS: 126 U/L — AB (ref 39–117)
ALT: 20 U/L (ref 0–35)
AST: 24 U/L (ref 0–37)
BUN: 19 mg/dL (ref 6–23)
CO2: 24 meq/L (ref 19–32)
Calcium: 10.5 mg/dL (ref 8.4–10.5)
Chloride: 97 mEq/L (ref 96–112)
Creat: 0.83 mg/dL (ref 0.50–1.10)
GFR, EST AFRICAN AMERICAN: 80 mL/min
GFR, EST NON AFRICAN AMERICAN: 70 mL/min
GLUCOSE: 82 mg/dL (ref 70–99)
POTASSIUM: 5 meq/L (ref 3.5–5.3)
SODIUM: 129 meq/L — AB (ref 135–145)
TOTAL PROTEIN: 6.6 g/dL (ref 6.0–8.3)
Total Bilirubin: 0.3 mg/dL (ref 0.2–1.2)

## 2013-07-03 LAB — URINE DRUGS OF ABUSE SCREEN W ALC, ROUTINE (REF LAB)
Amphetamine Screen, Ur: NEGATIVE
BARBITURATE QUANT UR: NEGATIVE
Benzodiazepines.: NEGATIVE
COCAINE METABOLITES: NEGATIVE
Creatinine,U: 129.1 mg/dL
MARIJUANA METABOLITE: NEGATIVE
Methadone: NEGATIVE
OPIATE SCREEN, URINE: NEGATIVE
Phencyclidine (PCP): NEGATIVE
Propoxyphene: NEGATIVE

## 2013-07-03 LAB — RPR

## 2013-07-03 LAB — HIV ANTIBODY (ROUTINE TESTING W REFLEX): HIV: NONREACTIVE

## 2013-07-03 LAB — TSH: TSH: 0.589 u[IU]/mL (ref 0.350–4.500)

## 2013-07-09 ENCOUNTER — Ambulatory Visit (INDEPENDENT_AMBULATORY_CARE_PROVIDER_SITE_OTHER): Payer: Medicare Other | Admitting: Physician Assistant

## 2013-07-09 ENCOUNTER — Ambulatory Visit
Admission: RE | Admit: 2013-07-09 | Discharge: 2013-07-09 | Disposition: A | Discharge status: Home / Self Care | Payer: Medicare Other | Source: Ambulatory Visit | Attending: Physician Assistant | Admitting: Physician Assistant

## 2013-07-09 ENCOUNTER — Other Ambulatory Visit: Payer: Self-pay | Admitting: Family Medicine

## 2013-07-09 ENCOUNTER — Telehealth: Payer: Self-pay | Admitting: Family Medicine

## 2013-07-09 ENCOUNTER — Encounter: Payer: Self-pay | Admitting: Physician Assistant

## 2013-07-09 VITALS — BP 98/56 | HR 96 | Temp 97.0°F | Resp 18 | Wt 108.0 lb

## 2013-07-09 DIAGNOSIS — F17200 Nicotine dependence, unspecified, uncomplicated: Secondary | ICD-10-CM

## 2013-07-09 DIAGNOSIS — R059 Cough, unspecified: Secondary | ICD-10-CM

## 2013-07-09 DIAGNOSIS — E039 Hypothyroidism, unspecified: Secondary | ICD-10-CM

## 2013-07-09 DIAGNOSIS — E871 Hypo-osmolality and hyponatremia: Secondary | ICD-10-CM

## 2013-07-09 DIAGNOSIS — Z1211 Encounter for screening for malignant neoplasm of colon: Secondary | ICD-10-CM

## 2013-07-09 DIAGNOSIS — Z720 Tobacco use: Secondary | ICD-10-CM

## 2013-07-09 DIAGNOSIS — R05 Cough: Secondary | ICD-10-CM

## 2013-07-09 DIAGNOSIS — R634 Abnormal weight loss: Secondary | ICD-10-CM

## 2013-07-09 DIAGNOSIS — R9389 Abnormal findings on diagnostic imaging of other specified body structures: Secondary | ICD-10-CM

## 2013-07-09 DIAGNOSIS — I10 Essential (primary) hypertension: Secondary | ICD-10-CM

## 2013-07-09 DIAGNOSIS — Z1212 Encounter for screening for malignant neoplasm of rectum: Secondary | ICD-10-CM

## 2013-07-09 DIAGNOSIS — Z1239 Encounter for other screening for malignant neoplasm of breast: Secondary | ICD-10-CM

## 2013-07-09 DIAGNOSIS — E7800 Pure hypercholesterolemia, unspecified: Secondary | ICD-10-CM

## 2013-07-09 MED ORDER — AZITHROMYCIN 250 MG PO TABS: ORAL_TABLET | ORAL | Status: DC

## 2013-07-09 MED ORDER — LISINOPRIL 10 MG PO TABS
10.0000 mg | ORAL_TABLET | Freq: Every day | ORAL | Status: AC
Start: 2013-07-09 — End: ?

## 2013-07-09 NOTE — Progress Notes (Signed)
Patient ID: Emma Douglas MRN: [id number], DOB: 02-09-1941, 73 y.o. Date of Encounter: '@DATE' @  Chief Complaint:  Chief Complaint  Patient presents with  . one week follow up    unable to reach about labs    HPI: 73 y.o. year old AA female  presents for one-week followup office visit.   She was here with her daughter today. Her granddaughter was with her at her visit one week ago.   AT HER LAST OFFICE VISIT, WHICH WAS ONE WEEK AGO, WE DISCUSSED THE FOLLOWING:   Granddaughter reports that the patient has been living with her daughter (patient's daughter, who is mother of this granddaughter present today) and this granddaughter for the past 5 days. Prior to that, patient was living in her same house that she had always lived in which his right off of the exit from [address] from here. Granddaughter tells me that some female acquaintance was living at the patient's house for the past several months. She says that he is not the patient's "boyfriend". Granddaughter refers to him as "unwanted visitor." Grand daughter does tell me-- at the end of the visit,  after the patient is not in the room--- that they are aware that this man was doing cocaine.                                                  They do not know whether the patient has also been doing cocaine with him or not.  Granddaughter also  Mentions (while patient not in room)  that she knows that the patient has been drinking significant amount of alcohol.  [place] daughter also says that the patient had been smoking a lot of cigars. Says that she smoked 15 cigars in just 3 days because recently she had purchased them for her and then she  called her, wanting her to buy her some more.   I discussed with the patient and her granddaughter the fact that years ago I used to see the patient on a very routine basis. Actually reviewed her old paper chart. Saw her on a very regular routine basis from 2006 up until 09/11/2008. I  was seeing her routinely for hypertension, hyperlipidemia, hypothyroidism. As well we were keeping up with her preventive care.  At that time she will come in routinely for lab work and office visits.  However since the visit 09/11/08 she has only been seen in this office a couple of times and that has been for "sick visits".  I asked what  had happened --in general-- but no.no good response an answer.  Also noted to them that the patient looks like she has lost significant weight. Asked them over  what amount of time  they had noted that this weight loss has occurred--whether this has gradually occurred over years or whether it has been sudden. Patient does not give me much of any answer. The granddaughter says that she thinks that things were okay up through about Christmas time. Says that she has noticed all of these change since then.  However I reviewed weight from her paper chart and from Cardiology OV note in Epic.  09/11/2008-------- 170 pounds. 07/19/2011----------- 140 pounds. 10/08/2012 cardiology office visit note---  125 pounds. Today weight is 104.  I told them that I can see in the  computer that she has had visits with cardiology. Asked whether she had seen any other type of medical providers and they say no.  AT THAT VISIT, I OBTAINED LABS AND TOLD THEM TO RETURN FOR F/U OV IN ONE WEEK SO WE COULD DISCUSS THOSE LABS AND PLAN FURTHER EVALUATION. Today they present for followup.  The only complaint today is that they do report that she's been having a cough. Daughter states that the cough has been present for months but is getting worse. States that the patient has not had any medical evaluation or treatment for this cough and has not been on any antibiotics for this. FOR LAB RESULTS, SEE SECTION JUST ABOVE ASSESSMENT/ PLAN  Past Medical History  Diagnosis Date  . Compensated hypothyroidism   . Essential hypertension   . Dyslipidemia   . Colon polyps   . Anxiety   . COPD  (chronic obstructive pulmonary disease)     secondary to smoking.  . Avascular necrosis     right femoral head  . Hyperlipidemia   . Smoking history   . Anemia      Postoperative hemorrhagic anemia  . Allergic reaction 10/24/2012     Home Meds: See attached medication section for current medication list. Any medications entered into computer today will not appear on this note's list. The medications listed below were entered prior to today. Current Outpatient Prescriptions on File Prior to Visit  Medication Sig Dispense Refill  . EPINEPHrine (EPI-PEN) 0.3 mg/0.3 mL SOAJ injection Inject 0.3 mLs (0.3 mg total) into the muscle once.  1 Device  2  . levothyroxine (SYNTHROID, LEVOTHROID) 100 MCG tablet TAKE 1 TABLET BY MOUTH DAILY.  30 tablet  5  . simvastatin (ZOCOR) 40 MG tablet TAKE 1 TABLET BY MOUTH AT BEDTIME.  30 tablet  0   No current facility-administered medications on file prior to visit.    Allergies: No Known Allergies  History   Social History  . Marital Status: Divorced    Spouse Name: N/A    Number of Children: N/A  . Years of Education: N/A   Occupational History  . Not on file.   Social History Main Topics  . Smoking status: Current Some Day Smoker -- 0.50 packs/day    Types: Cigarettes  . Smokeless tobacco: Never Used  . Alcohol Use: Yes  . Drug Use: No  . Sexual Activity: Yes   Other Topics Concern  . Not on file   Social History Narrative  . No narrative on file    Family History  Problem Relation Age of Onset  . Heart attack Mother   . Heart attack Father      Review of Systems:  See HPI for pertinent ROS. All other ROS negative.    Physical Exam: Blood pressure 98/56, pulse 96, temperature 97 F (36.1 C), temperature source Oral, resp. rate 18, weight 108 lb (48.988 kg)., Body mass index is 16.91 kg/(m^2). General: Thin AAF. Appears in no acute distress. Neck: Supple. No thyromegaly. No lymphadenopathy. No carotid bruit. Lungs: Clear  bilaterally to auscultation without wheezes, rales, or rhonchi. Breathing is unlabored. Heart: Reg Rhythm. II/VI murmur at apex. Abdomen: Soft, non-tender, non-distended with normoactive bowel sounds. No hepatomegaly. No rebound/guarding. No obvious abdominal masses. Musculoskeletal:  Strength and tone normal for age. Extremities/Skin: Warm and dry.  No edema. Neuro: Alert and oriented X 3. Moves all extremities spontaneously. Gait is normal. CNII-XII grossly in tact. Psych:  Responds to questions appropriately with a normal affect.  Results for orders placed in visit on 07/02/13  HIV ANTIBODY (ROUTINE TESTING)      Result Value Ref Range   HIV 1&2 Ab, 4th Generation NONREACTIVE  NONREACTIVE  RPR      Result Value Ref Range   RPR NON REAC  NON REAC  CBC WITH DIFFERENTIAL      Result Value Ref Range   WBC 7.6  4.0 - 10.5 K/uL   RBC 4.02  3.87 - 5.11 MIL/uL   Hemoglobin 13.4  12.0 - 15.0 g/dL   HCT 37.5  36.0 - 46.0 %   MCV 93.3  78.0 - 100.0 fL   MCH 33.3  26.0 - 34.0 pg   MCHC 35.7  30.0 - 36.0 g/dL   RDW 13.8  11.5 - 15.5 %   Platelets 314  150 - 400 K/uL   Neutrophils Relative % 63  43 - 77 %   Neutro Abs 4.8  1.7 - 7.7 K/uL   Lymphocytes Relative 26  12 - 46 %   Lymphs Abs 2.0  0.7 - 4.0 K/uL   Monocytes Relative 9  3 - 12 %   Monocytes Absolute 0.7  0.1 - 1.0 K/uL   Eosinophils Relative 1  0 - 5 %   Eosinophils Absolute 0.1  0.0 - 0.7 K/uL   Basophils Relative 1  0 - 1 %   Basophils Absolute 0.1  0.0 - 0.1 K/uL   Smear Review Criteria for review not met    COMPLETE METABOLIC PANEL WITH GFR      Result Value Ref Range   Sodium 129 (*) 135 - 145 mEq/L   Potassium 5.0  3.5 - 5.3 mEq/L   Chloride 97  96 - 112 mEq/L   CO2 24  19 - 32 mEq/L   Glucose, Bld 82  70 - 99 mg/dL   BUN 19  6 - 23 mg/dL   Creat 0.83  0.50 - 1.10 mg/dL   Total Bilirubin 0.3  0.2 - 1.2 mg/dL   Alkaline Phosphatase 126 (*) 39 - 117 U/L   AST 24  0 - 37 U/L   ALT 20  0 - 35 U/L   Total Protein  6.6  6.0 - 8.3 g/dL   Albumin 3.6  3.5 - 5.2 g/dL   Calcium 10.5  8.4 - 10.5 mg/dL   GFR, Est African American 80     GFR, Est Non African American 70    TSH      Result Value Ref Range   TSH 0.589  0.350 - 4.500 uIU/mL  DRUGS OF ABUSE SCREEN W ALC, ROUTINE URINE      Result Value Ref Range   Benzodiazepines. NEG  Negative   Phencyclidine (PCP) NEG  Negative   Cocaine Metabolites NEG  Negative   Amphetamine Screen, Ur NEG  Negative   Marijuana Metabolite NEG  Negative   Opiate Screen, Urine NEG  Negative   Barbiturate Quant, Ur NEG  Negative   Methadone NEG  Negative   Propoxyphene NEG  Negative   Ethyl Alcohol <10  <10 mg/dL   Creatinine,U 129.1       ASSESSMENT AND PLAN:  73 y.o. year old female with    1. Unintentional weight loss Labs are  normal and do not provide any explanation for her weight loss. At this time will update her cancer screenings. Obtain chest x-ray. Obtain mammogram.  Obtain followup colonoscopy as well as follow up with GI regarding this unintentional weight  loss. - DG Chest 2 View; Future  2. Cough - azithromycin (ZITHROMAX) 250 MG tablet; Day 1: Take 2 daily.  Days 2-5: Take 1 daily.  Dispense: 6 tablet; Refill: 0 - DG Chest 2 View; Future  3. Tobacco abuse - DG Chest 2 View; Future  4. Screening for colorectal cancer Last colonoscopy was by Dr. Penelope Coop in 2006. This did reveal polyps and was to be followed up in 5 years. They are agreeable for me to schedule followup with Dr. Penelope Coop for followup colonoscopy as well as GI evaluation at this unintentional weight loss. - Ambulatory referral to Gastroenterology  5. Screening for breast cancer SHe has had no recent mammogram. They are agreeable for me to schedule followup mammogram. - MM Digital Screening; Future  6. Hyponatremia Our staff was Unable to get in touch with them with the lab results (staff has obtained daughter's cell phone # today so we can f/u with them). Therefore today he did  not get the information about fluid restriction. However when I mention this today the daughter states that the patient is barely drinking anything even at this time.  7. Hypertension Blood pressure was low at the last visit and is low again today. We'll decrease lisinopril from 20 mg down to 10 mg. - lisinopril (PRINIVIL,ZESTRIL) 10 MG tablet; Take 1 tablet (10 mg total) by mouth daily.  Dispense: 90 tablet; Refill: 3  8. Hypercholesterolemia At her last office visit she was nonfasting therefore we cannot do a lipid panel that day. We did go ahead and check her LFTs. I plan to check a lipid panel in the future once these other issues were addressed and then she can return fasting.  9. Hypothyroidism TSH was normal on labs last week. Continue current dose of levothyroxine which is 100 mcg daily  10. Alcohol abuse, daily use  At [place] I did review notes by cardiology dated 10/08/12. Also  I did review discharge summary from hospitalization 11/01/11. At that time she had presented to the ER after being found by a neighbor on the floor. Even that Hospital note  did make mention of decreased intake and weight loss. During that hospitalization she had a head CT that showed subdural hematoma. Marked hyponatremia with sodium of 112. Also had hypokalemia and renal insufficiency which is secondary to prerenal azotemia. Those records indicated that the hyponatremia and hypokalemia likely secondary to hydrochlorothiazide and dehydration.  As well, at [place] I asked the granddaughter how long the patient will be staying with her and the patient's daughter.  Grand daughter states that 'the patient will be staying with them until they get some answers and get things back to the way they were."   ADDENDUM: PT WENT DIRECTLY TO CXR AFTER OV HERE. RADIOLOGIST CALLED ME AND INFORMED ME THAT CXR SHOWS NODULAR FINDING. RECOMMENDS CT CHEST AND ABDOMEN WITH CONTRAST. THIS ORDER HAS BEEN ADDED AND THE OTHER ORDERS HAVE  BEEN CANCELED (FOR NOW)--MAMMOGRAM, COLONOSCOPY--WILL REMOVE THESE ORDERS FOR NOW, UNTIL WE GET RESULTS OF CT.      [address] [place], [place], [facility] 07/09/2013 1:49 PM

## 2013-07-09 NOTE — Telephone Encounter (Signed)
Spoke to patient's daughter Olin Hauser in reference to Chest Xray results.  Explain abnormal reading and based on this and weight loss, provider feels CT Abd/Pelvis with contrast needed.  Referral nurse to arrange ASAP.  We will contact her with appt date.

## 2013-07-10 ENCOUNTER — Other Ambulatory Visit: Payer: Self-pay | Admitting: Physician Assistant

## 2013-07-10 DIAGNOSIS — R9389 Abnormal findings on diagnostic imaging of other specified body structures: Secondary | ICD-10-CM

## 2013-07-10 DIAGNOSIS — R918 Other nonspecific abnormal finding of lung field: Secondary | ICD-10-CM

## 2013-07-11 ENCOUNTER — Ambulatory Visit
Admission: RE | Admit: 2013-07-11 | Discharge: 2013-07-11 | Disposition: A | Discharge status: Home / Self Care | Payer: Medicare Other | Source: Ambulatory Visit | Attending: Physician Assistant | Admitting: Physician Assistant

## 2013-07-11 DIAGNOSIS — R9389 Abnormal findings on diagnostic imaging of other specified body structures: Secondary | ICD-10-CM

## 2013-07-11 DIAGNOSIS — R634 Abnormal weight loss: Secondary | ICD-10-CM

## 2013-07-11 DIAGNOSIS — R918 Other nonspecific abnormal finding of lung field: Secondary | ICD-10-CM

## 2013-07-11 MED ORDER — IOHEXOL 300 MG/ML  SOLN
100.0000 mL | Freq: Once | INTRAMUSCULAR | Status: AC | PRN
  Administered 2013-07-11: 100 mL via INTRAVENOUS

## 2013-07-14 ENCOUNTER — Telehealth: Payer: Self-pay | Admitting: Family Medicine

## 2013-07-14 DIAGNOSIS — IMO0001 Reserved for inherently not codable concepts without codable children: Secondary | ICD-10-CM

## 2013-07-14 NOTE — Telephone Encounter (Signed)
Message copied by Olena Mater on Mon Jul 14, 2013 11:06 AM ------      Message from: Dena Billet      Created: Mon Jul 14, 2013  7:51 AM       1- Call patients daughter and inform her that scan shows mass in lung, felt to be c/w cancer. Tell her that we will f/u with cancer doctors to see if they want pt to see pulmonary for a biopsy first vs go ahead with appt with oncology first.       2- Please call oncology and discuss CT result with them to see whether they want pt to go ahead and have appt with them first or have appt with pulmonary for biopsy first.       Then, please place order for referrals based on oncology's input. ------

## 2013-07-14 NOTE — Telephone Encounter (Signed)
Spoke to daughter Olin Hauser.  Made aware of CT results.  Referral to Pulmonary for biopsies to follow up with Oncology referral for treatment.

## 2013-07-18 ENCOUNTER — Other Ambulatory Visit: Payer: Self-pay | Admitting: Cardiology

## 2013-07-18 ENCOUNTER — Telehealth: Payer: Self-pay | Admitting: Internal Medicine

## 2013-07-18 NOTE — Telephone Encounter (Signed)
S/W PATIENT DTR PAMELA AND GAVE NP APPT FOR 05/26 @ 11 W/DR.MOHAMED.  DX- LINGULAR MASS WELCOME PACKET MAILED TO DTR PAMELA Bulman.

## 2013-07-21 ENCOUNTER — Other Ambulatory Visit: Payer: Self-pay | Admitting: Cardiology

## 2013-07-29 ENCOUNTER — Other Ambulatory Visit: Payer: Self-pay | Admitting: Internal Medicine

## 2013-07-29 ENCOUNTER — Ambulatory Visit: Payer: Medicare Other

## 2013-07-29 ENCOUNTER — Ambulatory Visit (HOSPITAL_BASED_OUTPATIENT_CLINIC_OR_DEPARTMENT_OTHER): Payer: Medicare Other | Admitting: Internal Medicine

## 2013-07-29 ENCOUNTER — Other Ambulatory Visit (HOSPITAL_BASED_OUTPATIENT_CLINIC_OR_DEPARTMENT_OTHER): Payer: Medicare Other

## 2013-07-29 ENCOUNTER — Encounter: Payer: Self-pay | Admitting: Internal Medicine

## 2013-07-29 VITALS — BP 113/76 | HR 88 | Temp 98.4°F | Resp 19 | Ht 67.0 in | Wt 102.8 lb

## 2013-07-29 DIAGNOSIS — R222 Localized swelling, mass and lump, trunk: Secondary | ICD-10-CM

## 2013-07-29 DIAGNOSIS — K319 Disease of stomach and duodenum, unspecified: Secondary | ICD-10-CM

## 2013-07-29 DIAGNOSIS — R918 Other nonspecific abnormal finding of lung field: Secondary | ICD-10-CM

## 2013-07-29 DIAGNOSIS — R599 Enlarged lymph nodes, unspecified: Secondary | ICD-10-CM

## 2013-07-29 DIAGNOSIS — F17200 Nicotine dependence, unspecified, uncomplicated: Secondary | ICD-10-CM

## 2013-07-29 DIAGNOSIS — K3189 Other diseases of stomach and duodenum: Secondary | ICD-10-CM

## 2013-07-29 LAB — CBC WITH DIFFERENTIAL/PLATELET
BASO%: 0.7 % (ref 0.0–2.0)
Basophils Absolute: 0.1 10*3/uL (ref 0.0–0.1)
EOS%: 0.1 % (ref 0.0–7.0)
Eosinophils Absolute: 0 10*3/uL (ref 0.0–0.5)
HCT: 39.4 % (ref 34.8–46.6)
HGB: 13.2 g/dL (ref 11.6–15.9)
LYMPH#: 1.2 10*3/uL (ref 0.9–3.3)
LYMPH%: 9.9 % — ABNORMAL LOW (ref 14.0–49.7)
MCH: 33.1 pg (ref 25.1–34.0)
MCHC: 33.5 g/dL (ref 31.5–36.0)
MCV: 98.7 fL (ref 79.5–101.0)
MONO#: 1.1 10*3/uL — ABNORMAL HIGH (ref 0.1–0.9)
MONO%: 9 % (ref 0.0–14.0)
NEUT#: 10.1 10*3/uL — ABNORMAL HIGH (ref 1.5–6.5)
NEUT%: 80.3 % — ABNORMAL HIGH (ref 38.4–76.8)
Platelets: 419 10*3/uL — ABNORMAL HIGH (ref 145–400)
RBC: 3.99 10*6/uL (ref 3.70–5.45)
RDW: 13.7 % (ref 11.2–14.5)
WBC: 12.6 10*3/uL — ABNORMAL HIGH (ref 3.9–10.3)

## 2013-07-29 LAB — COMPREHENSIVE METABOLIC PANEL (CC13)
ALBUMIN: 2.7 g/dL — AB (ref 3.5–5.0)
ALT: 10 U/L (ref 0–55)
AST: 22 U/L (ref 5–34)
Alkaline Phosphatase: 102 U/L (ref 40–150)
Anion Gap: 10 mEq/L (ref 3–11)
BUN: 12 mg/dL (ref 7.0–26.0)
CALCIUM: 10.3 mg/dL (ref 8.4–10.4)
CHLORIDE: 99 meq/L (ref 98–109)
CO2: 25 mEq/L (ref 22–29)
CREATININE: 0.8 mg/dL (ref 0.6–1.1)
Glucose: 118 mg/dl (ref 70–140)
POTASSIUM: 3.8 meq/L (ref 3.5–5.1)
Sodium: 134 mEq/L — ABNORMAL LOW (ref 136–145)
Total Bilirubin: 0.62 mg/dL (ref 0.20–1.20)
Total Protein: 7.1 g/dL (ref 6.4–8.3)

## 2013-07-29 MED ORDER — TRAMADOL HCL 50 MG PO TABS: 50.0000 mg | ORAL_TABLET | Freq: Four times a day (QID) | ORAL | Status: DC | PRN

## 2013-07-29 NOTE — Progress Notes (Signed)
Checked in new patient with no financial issues and has not seen the dr yet. She has not been out of the country.

## 2013-07-29 NOTE — Progress Notes (Signed)
[facility] [phone]   [phone]  CONSULT NOTE  REFERRING PHYSICIAN: Dena Billet, PA-C  REASON FOR CONSULTATION:  73 years old [place] American female with questionable metastatic lung cancer.  HPI Emma Douglas is a 73 y.o. female was past medical history significant for multiple medical problems including history of hypertension, dyslipidemia, hypothyroidism, history of subdural hematoma, colon polyps and avascular necrosis of the hip as well as history of anemia and long history of smoking. The patient was seen a few weeks ago by her primary care physician complaining of productive cough and 40 pounds of weight loss as well as wheezes and left upper quadrant abdominal pain. Chest x-ray was performed and showed a mass in the left lung. This was followed by CT scan of the chest, abdomen and pelvis on 07/11/2013 and it showed Mediastinal adenopathy measures up to 2.5 x 3.3 cm in the lower right paratracheal station. Subcarinal adenopathy measures 3.0 x 6.2  cm. Bi hilar adenopathy measures up to 3.6 x 4.3 cm on the left. There is significant respiratory motion at the lung bases, limiting  evaluation. A 2.5 x 4.0 cm mass is seen in the lingula. A spiculated nodule in the apical segment right upper lobe measures  0.5 x 1.2 cm. There is masslike thickening in the proximal stomach, measuring approximately 3.4 x 5.1 cm. Stomach, small bowel and colon are otherwise grossly unremarkable.  The patient was referred to me today for evaluation and recommendation regarding these abnormalities.  She continues to have the left upper quadrant pain in addition to stiffness in her legs. She also complaining of shortness of breath at baseline and increased with exertion and cough productive of whitish sputum but no hemoptysis. She has significant weight loss in the last few weeks. She has poor vision and mild headache on the left temple. The patient also has early  satiety in addition to occasional nausea and vomiting. She denied having any significant fever or chills. Her family history significant for cancer of unknown type in her mother, sister and brother. Father and mother had heart disease. The patient is single and has one daughter, Emma Douglas who accompanied her to the visit today. She did work in several jobs Pensions consultant, farming and Electrical engineer. She has a history of smoking one pack per day for around 3 years and unfortunately she continues to smoke. I strongly advise him to quit smoking and offered to her smoke cessation program. She also drink beer at regular basis and no history of drug abuse.   HPI  Past Medical History  Diagnosis Date  . Compensated hypothyroidism   . Essential hypertension   . Dyslipidemia   . Colon polyps   . Anxiety   . COPD (chronic obstructive pulmonary disease)     secondary to smoking.  . Avascular necrosis     right femoral head  . Hyperlipidemia   . Smoking history   . Anemia      Postoperative hemorrhagic anemia  . Allergic reaction 10/24/2012    Past Surgical History  Procedure Laterality Date  . Colonoscopy      with polypectomy and biopsy  . Total hip arthroplasty      Family History  Problem Relation Age of Onset  . Heart attack Mother   . Heart attack Father     Social History History  Substance Use Topics  . Smoking status: Current Some Day Smoker -- 0.50 packs/day    Types:  Cigarettes  . Smokeless tobacco: Never Used  . Alcohol Use: Yes    Allergies  Allergen Reactions  . Black Pepper [[place]] Anaphylaxis  . Tomato Anaphylaxis    Has epi pen    Current Outpatient Prescriptions  Medication Sig Dispense Refill  . dextromethorphan-guaiFENesin (MUCINEX DM) 30-600 MG per 12 hr tablet Take 1 tablet by mouth 2 (two) times daily as needed for cough.      Marland Kitchen ibuprofen (ADVIL,MOTRIN) 100 MG chewable tablet Chew 200 mg by mouth every 8 (eight) hours as needed.      Marland Kitchen  levothyroxine (SYNTHROID, LEVOTHROID) 100 MCG tablet TAKE 1 TABLET BY MOUTH DAILY.  30 tablet  5  . lisinopril (PRINIVIL,ZESTRIL) 10 MG tablet Take 1 tablet (10 mg total) by mouth daily.  90 tablet  3  . Multiple Vitamin (MULTIVITAMIN) tablet Take 1 tablet by mouth daily.      Marland Kitchen OVER THE COUNTER MEDICATION Cough syrup      . simvastatin (ZOCOR) 40 MG tablet TAKE 1 TABLET BY MOUTH AT BEDTIME.  30 tablet  0  . EPINEPHrine (EPI-PEN) 0.3 mg/0.3 mL SOAJ injection Inject 0.3 mLs (0.3 mg total) into the muscle once.  1 Device  2  . traMADol (ULTRAM) 50 MG tablet Take 1 tablet (50 mg total) by mouth every 6 (six) hours as needed.  40 tablet  0   No current facility-administered medications for this visit.    Review of Systems  Constitutional: positive for anorexia, fatigue and weight loss Eyes: positive for visual disturbance Ears, nose, mouth, throat, and face: negative Respiratory: positive for cough, dyspnea on exertion, sputum and wheezing Cardiovascular: negative Gastrointestinal: positive for abdominal pain, nausea and vomiting Genitourinary:negative Integument/breast: negative Hematologic/lymphatic: negative Musculoskeletal:positive for muscle weakness Neurological: negative Behavioral/Psych: negative Endocrine: negative Allergic/Immunologic: negative  Physical Exam  BBC:WUGQB, healthy, no distress, ill looking and malnourished SKIN: skin color, texture, turgor are normal, no rashes or significant lesions HEAD: Normocephalic, No masses, lesions, tenderness or abnormalities EYES: normal, PERRLA EARS: External ears normal, Canals clear OROPHARYNX:no exudate, no erythema and lips, buccal mucosa, and tongue normal  NECK: supple, no adenopathy, no JVD LYMPH:  no palpable lymphadenopathy, no hepatosplenomegaly BREAST:not examined LUNGS: clear to auscultation , and palpation HEART: regular rate & rhythm, no murmurs and no gallops ABDOMEN:abdomen soft, non-tender, normal bowel sounds  and no masses or organomegaly BACK: Back symmetric, no curvature., No CVA tenderness EXTREMITIES:no joint deformities, effusion, or inflammation, no edema, no skin discoloration  NEURO: alert & oriented x 3 with fluent speech, no focal motor/sensory deficits  PERFORMANCE STATUS: ECOG 1  LABORATORY DATA: Lab Results  Component Value Date   WBC 12.6* 07/29/2013   HGB 13.2 07/29/2013   HCT 39.4 07/29/2013   MCV 98.7 07/29/2013   PLT 419* 07/29/2013      Chemistry      Component Value Date/Time   NA 134* 07/29/2013 1111   NA 129* 07/02/2013 1246   K 3.8 07/29/2013 1111   K 5.0 07/02/2013 1246   CL 97 07/02/2013 1246   CO2 25 07/29/2013 1111   CO2 24 07/02/2013 1246   BUN 12.0 07/29/2013 1111   BUN 19 07/02/2013 1246   CREATININE 0.8 07/29/2013 1111   CREATININE 0.83 07/02/2013 1246   CREATININE 0.7 04/10/2012 1154      Component Value Date/Time   CALCIUM 10.3 07/29/2013 1111   CALCIUM 10.5 07/02/2013 1246   ALKPHOS 102 07/29/2013 1111   ALKPHOS 126* 07/02/2013 1246   AST 22 07/29/2013  1111   AST 24 07/02/2013 1246   ALT 10 07/29/2013 1111   ALT 20 07/02/2013 1246   BILITOT 0.62 07/29/2013 1111   BILITOT 0.3 07/02/2013 1246       RADIOGRAPHIC STUDIES: Dg Chest 2 View  07/09/2013   CLINICAL DATA:  Cough  EXAM: CHEST  2 VIEW  COMPARISON:  10/28/2011  FINDINGS: Cardiomediastinal silhouette is stable. There is nodular consolidation in left lower lobe measures 3.7 cm. This is best seen on lateral view. There is superimposed left breast density on frontal view. Further correlation with enhanced CT scan is recommended to exclude malignancy. There is mild hilar prominence bilaterally. Adenopathy cannot be excluded.  IMPRESSION: There is nodular consolidation in left lower lobe measures 3.7 cm. This is best seen on lateral view. There is superimposed left breast density on frontal view. Further correlation with enhanced CT scan is recommended to exclude malignancy. There is mild hilar prominence bilaterally.  Adenopathy cannot be excluded.  These results were called by telephone at the time of interpretation on 07/09/2013 at 11:39 AM to Dr. Dena Billet , who verbally acknowledged these results.   Electronically Signed   By: Lahoma Crocker M.D.   On: 07/09/2013 11:39   Ct Chest W Contrast  07/11/2013   CLINICAL DATA:  40 pound unexplained weight loss. Productive cough with abnormal chest radiograph.  EXAM: CT CHEST, ABDOMEN, AND PELVIS WITH CONTRAST  TECHNIQUE: Multidetector CT imaging of the chest, abdomen and pelvis was performed following the standard protocol during bolus administration of intravenous contrast.  CONTRAST:  149mL OMNIPAQUE IOHEXOL 300 MG/ML  SOLN  COMPARISON:  Chest radiograph 07/09/2013.  FINDINGS: CT CHEST FINDINGS  Mediastinal adenopathy measures up to 2.5 x 3.3 cm in the lower right paratracheal station. Subcarinal adenopathy measures 3.0 x 6.2 cm. Bi hilar adenopathy measures up to 3.6 x 4.3 cm on the left. No axillary adenopathy. Atherosclerotic calcification of the arterial vasculature, including coronary arteries. Heart is enlarged. No pericardial effusion.  There is significant respiratory motion at the lung bases, limiting evaluation. A 2.5 x 4.0 cm mass is seen in the lingula (image 52). A spiculated nodule in the apical segment right upper lobe measures 0.5 x 1.2 cm (image 14). Centrilobular emphysema. Probable mild postobstructive ground-glass in the lingula and left lower lobe. No pleural fluid. Airway is otherwise unremarkable.  CT ABDOMEN AND PELVIS FINDINGS  Image quality is degraded by motion. Liver is unremarkable. There is intrahepatic and extrahepatic biliary duct dilatation with the extrahepatic duct duct measuring up to 9 mm. Gallbladder, adrenal glands, kidneys, spleen and pancreas are unremarkable.  There is masslike thickening in the proximal stomach, measuring approximately 3.4 x 5.1 cm (series 6, image 18). Stomach, small bowel and colon are otherwise grossly unremarkable.  Bladder is grossly unremarkable. Ventral hernia contains fat. Atherosclerotic calcification of the arterial vasculature with the infrarenal aorta measuring up to 3.2 cm. There is significant streak artifact in the anatomic pelvis related to a right hip arthroplasty. No worrisome lytic or sclerotic lesions. Degenerative changes are seen in the spine. 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  IMPRESSION: 1. Image quality is markedly degraded by respiratory and patient motion. 2. Bulky mediastinal and bi hilar adenopathy with a spiculated right upper lobe nodule and lingular mass. 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 3. Suspect masslike thickening in the proximal stomach. Endoscopic evaluation may be helpful, as clinically indicated. 4. Intrahepatic and extrahepatic biliary duct dilatation without  definite cause. 5. Coronary artery calcification. 6. Midline ventral hernia contains fat. 7. Small infrarenal aortic aneurysm. 8. 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   Electronically Signed   By: Lorin Picket M.D.   On: 07/11/2013 16:42   Ct Abdomen Pelvis W Contrast  07/11/2013   CLINICAL DATA:  40 pound unexplained weight loss. Productive cough with abnormal chest radiograph.  EXAM: CT CHEST, ABDOMEN, AND PELVIS WITH CONTRAST  TECHNIQUE: Multidetector CT imaging of the chest, abdomen and pelvis was performed following the standard protocol during bolus administration of intravenous contrast.  CONTRAST:  113mL OMNIPAQUE IOHEXOL 300 MG/ML  SOLN  COMPARISON:  Chest radiograph 07/09/2013.  FINDINGS: CT CHEST FINDINGS  Mediastinal adenopathy measures up to 2.5 x 3.3 cm in the lower right paratracheal station. Subcarinal adenopathy measures 3.0 x 6.2 cm. Bi hilar adenopathy measures up to 3.6 x 4.3 cm on the left. No axillary adenopathy. Atherosclerotic calcification of the arterial vasculature, including coronary arteries. Heart is  enlarged. No pericardial effusion.  There is significant respiratory motion at the lung bases, limiting evaluation. A 2.5 x 4.0 cm mass is seen in the lingula (image 52). A spiculated nodule in the apical segment right upper lobe measures 0.5 x 1.2 cm (image 14). Centrilobular emphysema. Probable mild postobstructive ground-glass in the lingula and left lower lobe. No pleural fluid. Airway is otherwise unremarkable.  CT ABDOMEN AND PELVIS FINDINGS  Image quality is degraded by motion. Liver is unremarkable. There is intrahepatic and extrahepatic biliary duct dilatation with the extrahepatic duct duct measuring up to 9 mm. Gallbladder, adrenal glands, kidneys, spleen and pancreas are unremarkable.  There is masslike thickening in the proximal stomach, measuring approximately 3.4 x 5.1 cm (series 6, image 18). Stomach, small bowel and colon are otherwise grossly unremarkable. Bladder is grossly unremarkable. Ventral hernia contains fat. Atherosclerotic calcification of the arterial vasculature with the infrarenal aorta measuring up to 3.2 cm. There is significant streak artifact in the anatomic pelvis related to a right hip arthroplasty. No worrisome lytic or sclerotic lesions. Degenerative changes are seen in the spine. 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  IMPRESSION: 1. Image quality is markedly degraded by respiratory and patient motion. 2. Bulky mediastinal and bi hilar adenopathy with a spiculated right upper lobe nodule and lingular mass. 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 3. Suspect masslike thickening in the proximal stomach. Endoscopic evaluation may be helpful, as clinically indicated. 4. Intrahepatic and extrahepatic biliary duct dilatation without definite cause. 5. Coronary artery calcification. 6. Midline ventral hernia contains fat. 7. Small infrarenal aortic aneurysm. 8. Changes of avascular necrosis in the left femoral  head.   Electronically Signed   By: Lorin Picket M.D.   On: 07/11/2013 16:42    ASSESSMENT: This is a very pleasant 73 years old [place] American female recently presented with left lingular mass in addition to mediastinal lymphadenopathy and gastric mass. This is suspicious for either metastatic lung cancer or gastric cancer.   PLAN: I had a lengthy discussion with the patient and her daughter today about her current disease status and further evaluation to confirm a diagnosis. I ordered several studies today to complete the staging workup including PET scan as well as MRI of the brain. I would also referred the patient to Dr. Penelope Coop her gastroenterologist for consideration of upper endoscopy and biopsy of the gastric mass for tissue diagnosis. If no clear etiology from the gastric mass, then  I would consider the patient for CT guided core biopsy of the left lingular mass. I would see the patient back for followup visit in 2 weeks for reevaluation and further discussion of her treatment options based on the final pathology and staging workup For pain management, I started the patient on tramadol 50 mg by mouth Q6 hours as needed for pain. She was advised to call immediately if she has any concerning symptoms in the interval. The patient voices understanding of current disease status and treatment options and is in agreement with the current care plan.  All questions were answered. The patient knows to call the clinic with any problems, questions or concerns. We can certainly see the patient much sooner if necessary.  Thank you so much for allowing me to participate in the care of [place]. I will continue to follow up the patient with you and assist in her care.  I spent 40 minutes counseling the patient face to face. The total time spent in the appointment was 60 minutes.  Disclaimer: This note was dictated with voice recognition software. Similar sounding words can inadvertently  be transcribed and may not be corrected upon review.   Curt Bears 07/29/2013, 12:20 PM

## 2013-07-29 NOTE — Patient Instructions (Signed)
Smoking Cessation Quitting smoking is important to your health and has many advantages. However, it is not always easy to quit since nicotine is a very addictive drug. Often times, people try 3 times or more before being able to quit. This document explains the best ways for you to prepare to quit smoking. Quitting takes hard work and a lot of effort, but you can do it. ADVANTAGES OF QUITTING SMOKING  You will live longer, feel better, and live better.  Your body will feel the impact of quitting smoking almost immediately.  Within 20 minutes, blood pressure decreases. Your pulse returns to its normal level.  After 8 hours, carbon monoxide levels in the blood return to normal. Your oxygen level increases.  After 24 hours, the chance of having a heart attack starts to decrease. Your breath, hair, and body stop smelling like smoke.  After 48 hours, damaged nerve endings begin to recover. Your sense of taste and smell improve.  After 72 hours, the body is virtually free of nicotine. Your bronchial tubes relax and breathing becomes easier.  After 2 to 12 weeks, lungs can hold more air. Exercise becomes easier and circulation improves.  The risk of having a heart attack, stroke, cancer, or lung disease is greatly reduced.  After 1 year, the risk of coronary heart disease is cut in half.  After 5 years, the risk of stroke falls to the same as a nonsmoker.  After 10 years, the risk of lung cancer is cut in half and the risk of other cancers decreases significantly.  After 15 years, the risk of coronary heart disease drops, usually to the level of a nonsmoker.  If you are pregnant, quitting smoking will improve your chances of having a healthy baby.  The people you live with, especially any children, will be healthier.  You will have extra money to spend on things other than cigarettes. QUESTIONS TO THINK ABOUT BEFORE ATTEMPTING TO QUIT You may want to talk about your answers with your  caregiver.  Why do you want to quit?  If you tried to quit in the past, what helped and what did not?  What will be the most difficult situations for you after you quit? How will you plan to handle them?  Who can help you through the tough times? Your family? Friends? A caregiver?  What pleasures do you get from smoking? What ways can you still get pleasure if you quit? Here are some questions to ask your caregiver:  How can you help me to be successful at quitting?  What medicine do you think would be best for me and how should I take it?  What should I do if I need more help?  What is smoking withdrawal like? How can I get information on withdrawal? GET READY  Set a quit date.  Change your environment by getting rid of all cigarettes, ashtrays, matches, and lighters in your home, car, or work. Do not let people smoke in your home.  Review your past attempts to quit. Think about what worked and what did not. GET SUPPORT AND ENCOURAGEMENT You have a better chance of being successful if you have help. You can get support in many ways.  Tell your family, friends, and co-workers that you are going to quit and need their support. Ask them not to smoke around you.  Get individual, group, or telephone counseling and support. Programs are available at local hospitals and health centers. Call your local health department for   information about programs in your area.  Spiritual beliefs and practices may help some smokers quit.  Download a "quit meter" on your computer to keep track of quit statistics, such as how long you have gone without smoking, cigarettes not smoked, and money saved.  Get a self-help book about quitting smoking and staying off of tobacco. LEARN NEW SKILLS AND BEHAVIORS  Distract yourself from urges to smoke. Talk to someone, go for a walk, or occupy your time with a task.  Change your normal routine. Take a different route to work. Drink tea instead of coffee.  Eat breakfast in a different place.  Reduce your stress. Take a hot bath, exercise, or read a book.  Plan something enjoyable to do every day. Reward yourself for not smoking.  Explore interactive web-based programs that specialize in helping you quit. GET MEDICINE AND USE IT CORRECTLY Medicines can help you stop smoking and decrease the urge to smoke. Combining medicine with the above behavioral methods and support can greatly increase your chances of successfully quitting smoking.  Nicotine replacement therapy helps deliver nicotine to your body without the negative effects and risks of smoking. Nicotine replacement therapy includes nicotine gum, lozenges, inhalers, nasal sprays, and skin patches. Some may be available over-the-counter and others require a prescription.  Antidepressant medicine helps people abstain from smoking, but how this works is unknown. This medicine is available by prescription.  Nicotinic receptor partial agonist medicine simulates the effect of nicotine in your brain. This medicine is available by prescription. Ask your caregiver for advice about which medicines to use and how to use them based on your health history. Your caregiver will tell you what side effects to look out for if you choose to be on a medicine or therapy. Carefully read the information on the package. Do not use any other product containing nicotine while using a nicotine replacement product.  RELAPSE OR DIFFICULT SITUATIONS Most relapses occur within the first 3 months after quitting. Do not be discouraged if you start smoking again. Remember, most people try several times before finally quitting. You may have symptoms of withdrawal because your body is used to nicotine. You may crave cigarettes, be irritable, feel very hungry, cough often, get headaches, or have difficulty concentrating. The withdrawal symptoms are only temporary. They are strongest when you first quit, but they will go away within  10 14 days. To reduce the chances of relapse, try to:  Avoid drinking alcohol. Drinking lowers your chances of successfully quitting.  Reduce the amount of caffeine you consume. Once you quit smoking, the amount of caffeine in your body increases and can give you symptoms, such as a rapid heartbeat, sweating, and anxiety.  Avoid smokers because they can make you want to smoke.  Do not let weight gain distract you. Many smokers will gain weight when they quit, usually less than 10 pounds. Eat a healthy diet and stay active. You can always lose the weight gained after you quit.  Find ways to improve your mood other than smoking. FOR MORE INFORMATION  www.smokefree.gov  Document Released: 02/14/2001 Document Revised: 08/22/2011 Document Reviewed: 06/01/2011 [employer] Patient Information 2014 [employer].  

## 2013-07-30 ENCOUNTER — Inpatient Hospital Stay (HOSPITAL_COMMUNITY): Payer: Medicare Other

## 2013-07-30 ENCOUNTER — Other Ambulatory Visit: Payer: Self-pay

## 2013-07-30 ENCOUNTER — Ambulatory Visit (INDEPENDENT_AMBULATORY_CARE_PROVIDER_SITE_OTHER): Payer: Medicare Other | Admitting: Physician Assistant

## 2013-07-30 ENCOUNTER — Encounter: Payer: Self-pay | Admitting: Physician Assistant

## 2013-07-30 ENCOUNTER — Encounter (HOSPITAL_COMMUNITY): Payer: Self-pay | Admitting: Emergency Medicine

## 2013-07-30 ENCOUNTER — Emergency Department (HOSPITAL_COMMUNITY): Payer: Medicare Other

## 2013-07-30 ENCOUNTER — Inpatient Hospital Stay (HOSPITAL_COMMUNITY)
Admission: EM | Admit: 2013-07-30 | Discharge: 2013-08-06 | DRG: 180 | Disposition: A | Discharge status: Home Health | Payer: Medicare Other | Attending: Internal Medicine | Admitting: Internal Medicine

## 2013-07-30 VITALS — BP 80/56 | HR 90 | Temp 98.5°F | Resp 20 | Wt 103.0 lb

## 2013-07-30 DIAGNOSIS — E43 Unspecified severe protein-calorie malnutrition: Secondary | ICD-10-CM

## 2013-07-30 DIAGNOSIS — I119 Hypertensive heart disease without heart failure: Secondary | ICD-10-CM | POA: Diagnosis present

## 2013-07-30 DIAGNOSIS — Z681 Body mass index (BMI) 19 or less, adult: Secondary | ICD-10-CM | POA: Diagnosis not present

## 2013-07-30 DIAGNOSIS — R05 Cough: Secondary | ICD-10-CM | POA: Diagnosis not present

## 2013-07-30 DIAGNOSIS — F411 Generalized anxiety disorder: Secondary | ICD-10-CM | POA: Diagnosis present

## 2013-07-30 DIAGNOSIS — E46 Unspecified protein-calorie malnutrition: Secondary | ICD-10-CM | POA: Diagnosis present

## 2013-07-30 DIAGNOSIS — C3490 Malignant neoplasm of unspecified part of unspecified bronchus or lung: Secondary | ICD-10-CM

## 2013-07-30 DIAGNOSIS — K2970 Gastritis, unspecified, without bleeding: Secondary | ICD-10-CM | POA: Diagnosis present

## 2013-07-30 DIAGNOSIS — C3400 Malignant neoplasm of unspecified main bronchus: Principal | ICD-10-CM | POA: Diagnosis present

## 2013-07-30 DIAGNOSIS — K449 Diaphragmatic hernia without obstruction or gangrene: Secondary | ICD-10-CM | POA: Diagnosis present

## 2013-07-30 DIAGNOSIS — Z96649 Presence of unspecified artificial hip joint: Secondary | ICD-10-CM | POA: Diagnosis not present

## 2013-07-30 DIAGNOSIS — R059 Cough, unspecified: Secondary | ICD-10-CM

## 2013-07-30 DIAGNOSIS — I498 Other specified cardiac arrhythmias: Secondary | ICD-10-CM | POA: Diagnosis not present

## 2013-07-30 DIAGNOSIS — I4892 Unspecified atrial flutter: Secondary | ICD-10-CM | POA: Diagnosis present

## 2013-07-30 DIAGNOSIS — E039 Hypothyroidism, unspecified: Secondary | ICD-10-CM | POA: Diagnosis present

## 2013-07-30 DIAGNOSIS — J189 Pneumonia, unspecified organism: Secondary | ICD-10-CM | POA: Diagnosis present

## 2013-07-30 DIAGNOSIS — R64 Cachexia: Secondary | ICD-10-CM | POA: Diagnosis present

## 2013-07-30 DIAGNOSIS — E785 Hyperlipidemia, unspecified: Secondary | ICD-10-CM | POA: Diagnosis present

## 2013-07-30 DIAGNOSIS — R599 Enlarged lymph nodes, unspecified: Secondary | ICD-10-CM

## 2013-07-30 DIAGNOSIS — R590 Localized enlarged lymph nodes: Secondary | ICD-10-CM | POA: Diagnosis present

## 2013-07-30 DIAGNOSIS — I10 Essential (primary) hypertension: Secondary | ICD-10-CM

## 2013-07-30 DIAGNOSIS — R634 Abnormal weight loss: Secondary | ICD-10-CM

## 2013-07-30 DIAGNOSIS — K209 Esophagitis, unspecified without bleeding: Secondary | ICD-10-CM | POA: Diagnosis present

## 2013-07-30 DIAGNOSIS — E7800 Pure hypercholesterolemia, unspecified: Secondary | ICD-10-CM

## 2013-07-30 DIAGNOSIS — K2990 Gastroduodenitis, unspecified, without bleeding: Secondary | ICD-10-CM

## 2013-07-30 DIAGNOSIS — Z8249 Family history of ischemic heart disease and other diseases of the circulatory system: Secondary | ICD-10-CM

## 2013-07-30 DIAGNOSIS — K3189 Other diseases of stomach and duodenum: Secondary | ICD-10-CM | POA: Diagnosis present

## 2013-07-30 DIAGNOSIS — S065XAA Traumatic subdural hemorrhage with loss of consciousness status unknown, initial encounter: Secondary | ICD-10-CM | POA: Diagnosis present

## 2013-07-30 DIAGNOSIS — S065X9A Traumatic subdural hemorrhage with loss of consciousness of unspecified duration, initial encounter: Secondary | ICD-10-CM | POA: Diagnosis present

## 2013-07-30 DIAGNOSIS — G9340 Encephalopathy, unspecified: Secondary | ICD-10-CM

## 2013-07-30 DIAGNOSIS — J441 Chronic obstructive pulmonary disease with (acute) exacerbation: Secondary | ICD-10-CM | POA: Diagnosis present

## 2013-07-30 DIAGNOSIS — Z66 Do not resuscitate: Secondary | ICD-10-CM | POA: Diagnosis present

## 2013-07-30 DIAGNOSIS — F17200 Nicotine dependence, unspecified, uncomplicated: Secondary | ICD-10-CM | POA: Diagnosis present

## 2013-07-30 DIAGNOSIS — E871 Hypo-osmolality and hyponatremia: Secondary | ICD-10-CM | POA: Diagnosis present

## 2013-07-30 DIAGNOSIS — N179 Acute kidney failure, unspecified: Secondary | ICD-10-CM

## 2013-07-30 DIAGNOSIS — J9600 Acute respiratory failure, unspecified whether with hypoxia or hypercapnia: Secondary | ICD-10-CM | POA: Diagnosis present

## 2013-07-30 DIAGNOSIS — R918 Other nonspecific abnormal finding of lung field: Secondary | ICD-10-CM

## 2013-07-30 DIAGNOSIS — I471 Supraventricular tachycardia: Secondary | ICD-10-CM

## 2013-07-30 DIAGNOSIS — Z720 Tobacco use: Secondary | ICD-10-CM

## 2013-07-30 HISTORY — DX: Pneumonia, unspecified organism: J18.9

## 2013-07-30 HISTORY — DX: Unspecified osteoarthritis, unspecified site: M19.90

## 2013-07-30 HISTORY — DX: Traumatic subdural hemorrhage with loss of consciousness of unspecified duration, initial encounter: S06.5X9A

## 2013-07-30 HISTORY — DX: Other diseases of stomach and duodenum: K31.89

## 2013-07-30 HISTORY — DX: Unspecified atrial flutter: I48.92

## 2013-07-30 HISTORY — DX: Other nonspecific abnormal finding of lung field: R91.8

## 2013-07-30 LAB — CBC WITH DIFFERENTIAL/PLATELET
BASOS PCT: 0 % (ref 0–1)
Basophils Absolute: 0 10*3/uL (ref 0.0–0.1)
EOS ABS: 0 10*3/uL (ref 0.0–0.7)
Eosinophils Relative: 0 % (ref 0–5)
HCT: 41.3 % (ref 36.0–46.0)
HEMOGLOBIN: 14.4 g/dL (ref 12.0–15.0)
Lymphocytes Relative: 12 % (ref 12–46)
Lymphs Abs: 1.6 10*3/uL (ref 0.7–4.0)
MCH: 34.1 pg — AB (ref 26.0–34.0)
MCHC: 34.9 g/dL (ref 30.0–36.0)
MCV: 97.9 fL (ref 78.0–100.0)
MONOS PCT: 8 % (ref 3–12)
Monocytes Absolute: 1.1 10*3/uL — ABNORMAL HIGH (ref 0.1–1.0)
NEUTROS PCT: 80 % — AB (ref 43–77)
Neutro Abs: 11.3 10*3/uL — ABNORMAL HIGH (ref 1.7–7.7)
Platelets: 422 10*3/uL — ABNORMAL HIGH (ref 150–400)
RBC: 4.22 MIL/uL (ref 3.87–5.11)
RDW: 13.6 % (ref 11.5–15.5)
WBC: 14.1 10*3/uL — ABNORMAL HIGH (ref 4.0–10.5)

## 2013-07-30 LAB — CBC
HCT: 36.5 % (ref 36.0–46.0)
HEMOGLOBIN: 12.1 g/dL (ref 12.0–15.0)
MCH: 32.8 pg (ref 26.0–34.0)
MCHC: 33.2 g/dL (ref 30.0–36.0)
MCV: 98.9 fL (ref 78.0–100.0)
Platelets: 384 10*3/uL (ref 150–400)
RBC: 3.69 MIL/uL — ABNORMAL LOW (ref 3.87–5.11)
RDW: 13.6 % (ref 11.5–15.5)
WBC: 12.7 10*3/uL — ABNORMAL HIGH (ref 4.0–10.5)

## 2013-07-30 LAB — BASIC METABOLIC PANEL
BUN: 16 mg/dL (ref 6–23)
CALCIUM: 11.2 mg/dL — AB (ref 8.4–10.5)
CO2: 25 mEq/L (ref 19–32)
CREATININE: 0.84 mg/dL (ref 0.50–1.10)
Chloride: 94 mEq/L — ABNORMAL LOW (ref 96–112)
GFR calc non Af Amer: 67 mL/min — ABNORMAL LOW (ref >90)
GFR, EST AFRICAN AMERICAN: 77 mL/min — AB (ref >90)
Glucose, Bld: 108 mg/dL — ABNORMAL HIGH (ref 70–99)
POTASSIUM: 4.6 meq/L (ref 3.7–5.3)
Sodium: 135 mEq/L — ABNORMAL LOW (ref 137–147)

## 2013-07-30 LAB — I-STAT TROPONIN, ED: Troponin i, poc: 0.03 ng/mL (ref 0.00–0.08)

## 2013-07-30 LAB — CREATININE, SERUM
CREATININE: 0.61 mg/dL (ref 0.50–1.10)
GFR calc Af Amer: >90 mL/min (ref >90)
GFR calc non Af Amer: 87 mL/min — ABNORMAL LOW (ref >90)

## 2013-07-30 LAB — MAGNESIUM: Magnesium: 1.6 mg/dL (ref 1.5–2.5)

## 2013-07-30 LAB — PROTIME-INR
INR: 0.96 (ref 0.00–1.49)
Prothrombin Time: 12.6 seconds (ref 11.6–15.2)

## 2013-07-30 LAB — GLUCOSE, CAPILLARY: Glucose-Capillary: 128 mg/dL — ABNORMAL HIGH (ref 70–99)

## 2013-07-30 LAB — PRO B NATRIURETIC PEPTIDE: Pro B Natriuretic peptide (BNP): 2688 pg/mL — ABNORMAL HIGH (ref 0–125)

## 2013-07-30 LAB — I-STAT CG4 LACTIC ACID, ED: Lactic Acid, Venous: 1.77 mmol/L (ref 0.5–2.2)

## 2013-07-30 MED ORDER — DEXTROSE 5 % IV SOLN
1.0000 g | Freq: Once | INTRAVENOUS | Status: AC
  Administered 2013-07-30: 1 g via INTRAVENOUS
  Filled 2013-07-30: qty 10

## 2013-07-30 MED ORDER — ATORVASTATIN CALCIUM 20 MG PO TABS
20.0000 mg | ORAL_TABLET | Freq: Every day | ORAL | Status: DC
  Administered 2013-07-30 – 2013-08-05 (×7): 20 mg via ORAL
  Filled 2013-07-30 (×9): qty 1

## 2013-07-30 MED ORDER — GUAIFENESIN 100 MG/5ML PO SYRP
200.0000 mg | ORAL_SOLUTION | ORAL | Status: DC | PRN
  Administered 2013-07-31: 200 mg via ORAL
  Filled 2013-07-30 (×2): qty 10

## 2013-07-30 MED ORDER — LEVOTHYROXINE SODIUM 100 MCG PO TABS
100.0000 ug | ORAL_TABLET | Freq: Every day | ORAL | Status: DC
  Administered 2013-07-31 – 2013-08-06 (×7): 100 ug via ORAL
  Filled 2013-07-30 (×8): qty 1

## 2013-07-30 MED ORDER — DEXTROSE 5 % IV SOLN
1.0000 g | INTRAVENOUS | Status: DC
  Administered 2013-07-31 – 2013-08-04 (×5): 1 g via INTRAVENOUS
  Filled 2013-07-30 (×6): qty 10

## 2013-07-30 MED ORDER — IOHEXOL 350 MG/ML SOLN
100.0000 mL | Freq: Once | INTRAVENOUS | Status: AC | PRN
  Administered 2013-07-30: 100 mL via INTRAVENOUS

## 2013-07-30 MED ORDER — DILTIAZEM HCL 25 MG/5ML IV SOLN
10.0000 mg | Freq: Once | INTRAVENOUS | Status: AC
  Administered 2013-07-30: 10 mg via INTRAVENOUS
  Filled 2013-07-30: qty 5

## 2013-07-30 MED ORDER — SODIUM CHLORIDE 0.9 % IV BOLUS (SEPSIS)
1000.0000 mL | Freq: Once | INTRAVENOUS | Status: AC
  Administered 2013-07-30: 1000 mL via INTRAVENOUS

## 2013-07-30 MED ORDER — DEXTROSE 5 % IV SOLN
500.0000 mg | INTRAVENOUS | Status: DC
  Administered 2013-07-31 – 2013-08-01 (×2): 500 mg via INTRAVENOUS
  Filled 2013-07-30 (×4): qty 500

## 2013-07-30 MED ORDER — SODIUM CHLORIDE 0.9 % IJ SOLN
3.0000 mL | Freq: Two times a day (BID) | INTRAMUSCULAR | Status: DC
  Administered 2013-07-31 – 2013-08-06 (×12): 3 mL via INTRAVENOUS

## 2013-07-30 MED ORDER — ENOXAPARIN SODIUM 40 MG/0.4ML ~~LOC~~ SOLN
40.0000 mg | SUBCUTANEOUS | Status: DC
  Administered 2013-07-30: 40 mg via SUBCUTANEOUS
  Filled 2013-07-30 (×2): qty 0.4

## 2013-07-30 MED ORDER — IPRATROPIUM-ALBUTEROL 0.5-2.5 (3) MG/3ML IN SOLN
3.0000 mL | Freq: Once | RESPIRATORY_TRACT | Status: AC
  Administered 2013-07-30: 3 mL via RESPIRATORY_TRACT
  Filled 2013-07-30: qty 3

## 2013-07-30 MED ORDER — LEVALBUTEROL HCL 1.25 MG/3ML IN NEBU: 1.2500 mg | INHALATION_SOLUTION | Freq: Once | RESPIRATORY_TRACT | Status: DC

## 2013-07-30 MED ORDER — IPRATROPIUM-ALBUTEROL 0.5-2.5 (3) MG/3ML IN SOLN
3.0000 mL | RESPIRATORY_TRACT | Status: DC
  Filled 2013-07-30: qty 3

## 2013-07-30 MED ORDER — DEXTROSE 5 % IV SOLN
500.0000 mg | Freq: Once | INTRAVENOUS | Status: AC
  Administered 2013-07-30: 500 mg via INTRAVENOUS

## 2013-07-30 MED ORDER — ASPIRIN 81 MG PO CHEW
324.0000 mg | CHEWABLE_TABLET | Freq: Once | ORAL | Status: AC
  Administered 2013-07-30: 324 mg via ORAL
  Filled 2013-07-30: qty 4

## 2013-07-30 MED ORDER — SIMVASTATIN 40 MG PO TABS
40.0000 mg | ORAL_TABLET | Freq: Every day | ORAL | Status: DC
  Filled 2013-07-30: qty 1

## 2013-07-30 MED ORDER — LEVALBUTEROL HCL 1.25 MG/0.5ML IN NEBU
1.2500 mg | INHALATION_SOLUTION | Freq: Four times a day (QID) | RESPIRATORY_TRACT | Status: DC | PRN
  Administered 2013-07-31: 1.25 mg via RESPIRATORY_TRACT
  Filled 2013-07-30 (×2): qty 0.5

## 2013-07-30 MED ORDER — DILTIAZEM HCL 100 MG IV SOLR
5.0000 mg/h | INTRAVENOUS | Status: DC
  Administered 2013-07-30: 10 mg/h via INTRAVENOUS
  Administered 2013-07-30: 5 mg/h via INTRAVENOUS
  Filled 2013-07-30: qty 100

## 2013-07-30 MED ORDER — SODIUM CHLORIDE 0.9 % IV SOLN: 250.0000 mL | INTRAVENOUS | Status: DC | PRN

## 2013-07-30 MED ORDER — DILTIAZEM HCL 30 MG PO TABS
30.0000 mg | ORAL_TABLET | Freq: Three times a day (TID) | ORAL | Status: DC
  Administered 2013-07-30 – 2013-08-02 (×8): 30 mg via ORAL
  Filled 2013-07-30 (×11): qty 1

## 2013-07-30 MED ORDER — IBUPROFEN 100 MG PO CHEW
200.0000 mg | CHEWABLE_TABLET | Freq: Three times a day (TID) | ORAL | Status: DC | PRN
  Filled 2013-07-30: qty 2

## 2013-07-30 MED ORDER — METHYLPREDNISOLONE SODIUM SUCC 125 MG IJ SOLR
125.0000 mg | Freq: Once | INTRAMUSCULAR | Status: AC
  Administered 2013-07-30: 125 mg via INTRAVENOUS
  Filled 2013-07-30: qty 2

## 2013-07-30 MED ORDER — SODIUM CHLORIDE 0.9 % IJ SOLN: 3.0000 mL | INTRAMUSCULAR | Status: DC | PRN

## 2013-07-30 MED ORDER — LISINOPRIL 10 MG PO TABS
10.0000 mg | ORAL_TABLET | Freq: Every day | ORAL | Status: DC
  Administered 2013-07-31 – 2013-08-06 (×6): 10 mg via ORAL
  Filled 2013-07-30 (×7): qty 1

## 2013-07-30 MED ORDER — IBUPROFEN 200 MG PO TABS
200.0000 mg | ORAL_TABLET | Freq: Three times a day (TID) | ORAL | Status: DC | PRN
  Administered 2013-08-02 – 2013-08-05 (×3): 200 mg via ORAL
  Filled 2013-07-30 (×3): qty 1

## 2013-07-30 MED ORDER — METHYLPREDNISOLONE SODIUM SUCC 125 MG IJ SOLR
80.0000 mg | Freq: Two times a day (BID) | INTRAMUSCULAR | Status: DC
  Administered 2013-07-30 – 2013-08-01 (×4): 80 mg via INTRAVENOUS
  Filled 2013-07-30 (×5): qty 1.28

## 2013-07-30 MED ORDER — LEVALBUTEROL HCL 1.25 MG/0.5ML IN NEBU
1.2500 mg | INHALATION_SOLUTION | Freq: Once | RESPIRATORY_TRACT | Status: AC
  Administered 2013-07-30: 1.25 mg via RESPIRATORY_TRACT
  Filled 2013-07-30: qty 0.5

## 2013-07-30 NOTE — ED Notes (Signed)
Pt reports that she is coming from [facility]. States that she went there for wheezing and HR was noted to be in the 160's. Pt reports that she feels SOB, and having chest pain at this time.

## 2013-07-30 NOTE — Progress Notes (Signed)
Abx consult  Pt came in with CAP so placed on roc/azith. Neither will require renal adjustments so Rx will sign off.   Onnie Boer, PharmD Pager: [phone] 07/30/2013 8:52 PM

## 2013-07-30 NOTE — H&P (Signed)
PCP:   Karis Juba, PA-C   Chief Complaint:  Rapid heart rate  HPI: 73 yo female h/o htn, copd, subdural hematoma from fall in 2013, went to see pcp today for cough and her heart rate was 160.  She denies any cp or palpitations.  She has been coughing a lot for weeks and progressive worsening wheezing for a week also.  No fevers, no chills.  No n/v/d.  No le swelling or edema.  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  She has been set up for outpt PET/mri brain which is scheduled for next week.  She was on she believes a zpack several weeks ago for bronchitis.  She has no inhalers at home.  She lives with her daughter who is present, she denies any pain anywhere.    Review of Systems:  Positive and negative as per HPI otherwise all other systems are negative  Past Medical History: Past Medical History  Diagnosis Date  . Compensated hypothyroidism   . Essential hypertension   . Dyslipidemia   . Colon polyps   . Anxiety   . COPD (chronic obstructive pulmonary disease)     secondary to smoking.  . Avascular necrosis     right femoral head  . Hyperlipidemia   . Smoking history   . Anemia      Postoperative hemorrhagic anemia  . Allergic reaction 10/24/2012   Past Surgical History  Procedure Laterality Date  . Colonoscopy      with polypectomy and biopsy  . Total hip arthroplasty      Medications: Prior to Admission medications   Medication Sig Start Date End Date Taking? Authorizing Provider  EPINEPHrine (EPI-PEN) 0.3 mg/0.3 mL SOAJ injection Inject 0.3 mLs (0.3 mg total) into the muscle once. 10/24/12  Yes Mary B Dixon, PA-C  ibuprofen (ADVIL,MOTRIN) 100 MG chewable tablet Chew 200 mg by mouth every 8 (eight) hours as needed for mild pain.    Yes Historical Provider, MD  levothyroxine (SYNTHROID, LEVOTHROID) 100 MCG tablet Take 100 mcg by mouth daily before breakfast.   Yes  Historical Provider, MD  lisinopril (PRINIVIL,ZESTRIL) 10 MG tablet Take 1 tablet (10 mg total) by mouth daily. 07/09/13  Yes Orlena Sheldon, PA-C  Multiple Vitamin (MULTIVITAMIN) tablet Take 1 tablet by mouth daily.   Yes Historical Provider, MD  simvastatin (ZOCOR) 40 MG tablet Take 40 mg by mouth at bedtime.   Yes Historical Provider, MD  traMADol (ULTRAM) 50 MG tablet Take 1 tablet (50 mg total) by mouth every 6 (six) hours as needed. 07/29/13  Yes Curt Bears, MD    Allergies:   Allergies  Allergen Reactions  . Black Pepper [Piper] Anaphylaxis  . Tomato Anaphylaxis    Has epi pen    Social History:  reports that she has been smoking Cigarettes.  She has been smoking about 0.50 packs per day. She has never used smokeless tobacco. She reports that she drinks alcohol. She reports that she does not use illicit drugs.  Family History: Family History  Problem Relation Age of Onset  . Heart attack Mother   . Heart attack Father     Physical Exam: Filed Vitals:   07/30/13 1815 07/30/13 1831 07/30/13 1836 07/30/13 1845  BP: 123/67  105/57 114/37  Pulse: 83  85 83  Temp:      TempSrc:      Resp: 22  22  24  SpO2: 96% 95% 100% 92%   General appearance: alert, cooperative and no distress Head: Normocephalic, without obvious abnormality, atraumatic Eyes: negative Nose: Nares normal. Septum midline. Mucosa normal. No drainage or sinus tenderness. Neck: no JVD and supple, symmetrical, trachea midline Lungs: rhonchi bilaterally and wheezes bilaterally Heart: irregularly irregular rhythm Abdomen: soft, non-tender; bowel sounds normal; no masses,  no organomegaly Extremities: extremities normal, atraumatic, no cyanosis or edema Pulses: 2+ and symmetric Skin: Skin color, texture, turgor normal. No rashes or lesions Neurologic: Grossly normal    Labs on Admission:   Recent Labs  07/29/13 1111 07/30/13 1642  NA 134* 135*  K 3.8 4.6  CL  --  94*  CO2 25 25  GLUCOSE 118 108*   BUN 12.0 16  CREATININE 0.8 0.84  CALCIUM 10.3 11.2*  MG  --  1.6    Recent Labs  07/29/13 1111  AST 22  ALT 10  ALKPHOS 102  BILITOT 0.62  PROT 7.1  ALBUMIN 2.7*    Recent Labs  07/29/13 1111 07/30/13 1642  WBC 12.6* 14.1*  NEUTROABS 10.1* 11.3*  HGB 13.2 14.4  HCT 39.4 41.3  MCV 98.7 97.9  PLT 419* 422*   Radiological Exams on Admission: Ct Chest W Contrast  07/11/2013   CLINICAL DATA:  40 pound unexplained weight loss. Productive cough with abnormal chest radiograph.  EXAM: CT CHEST, ABDOMEN, AND PELVIS WITH CONTRAST  TECHNIQUE: Multidetector CT imaging of the chest, abdomen and pelvis was performed following the standard protocol during bolus administration of intravenous contrast.  CONTRAST:  151mL OMNIPAQUE IOHEXOL 300 MG/ML  SOLN  COMPARISON:  Chest radiograph 07/09/2013.  FINDINGS: CT CHEST FINDINGS  Mediastinal adenopathy measures up to 2.5 x 3.3 cm in the lower right paratracheal station. Subcarinal adenopathy measures 3.0 x 6.2 cm. Bi hilar adenopathy measures up to 3.6 x 4.3 cm on the left. No axillary adenopathy. Atherosclerotic calcification of the arterial vasculature, including coronary arteries. Heart is enlarged. No pericardial effusion.  There is significant respiratory motion at the lung bases, limiting evaluation. A 2.5 x 4.0 cm mass is seen in the lingula (image 52). A spiculated nodule in the apical segment right upper lobe measures 0.5 x 1.2 cm (image 14). Centrilobular emphysema. Probable mild postobstructive ground-glass in the lingula and left lower lobe. No pleural fluid. Airway is otherwise unremarkable.  CT ABDOMEN AND PELVIS FINDINGS  Image quality is degraded by motion. Liver is unremarkable. There is intrahepatic and extrahepatic biliary duct dilatation with the extrahepatic duct duct measuring up to 9 mm. Gallbladder, adrenal glands, kidneys, spleen and pancreas are unremarkable.  There is masslike thickening in the proximal stomach, measuring  approximately 3.4 x 5.1 cm (series 6, image 18). Stomach, small bowel and colon are otherwise grossly unremarkable. Bladder is grossly unremarkable. Ventral hernia contains fat. Atherosclerotic calcification of the arterial vasculature with the infrarenal aorta measuring up to 3.2 cm. There is significant streak artifact in the anatomic pelvis related to a right hip arthroplasty. No worrisome lytic or sclerotic lesions. Degenerative changes are seen in the spine. 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  IMPRESSION: 1. Image quality is markedly degraded by respiratory and patient motion. 2. Bulky mediastinal and bi hilar adenopathy with a spiculated right upper lobe nodule and lingular mass. 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 3. Suspect masslike thickening in the proximal stomach. Endoscopic evaluation may be helpful, as clinically indicated. 4. Intrahepatic and extrahepatic biliary duct dilatation  without definite cause. 5. Coronary artery calcification. 6. Midline ventral hernia contains fat. 7. Small infrarenal aortic aneurysm. 8. 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   Electronically Signed   By: Lorin Picket M.D.   On: 07/11/2013 16:42   Ct Abdomen Pelvis W Contrast  07/11/2013   CLINICAL DATA:  40 pound unexplained weight loss. Productive cough with abnormal chest radiograph.  EXAM: CT CHEST, ABDOMEN, AND PELVIS WITH CONTRAST  TECHNIQUE: Multidetector CT imaging of the chest, abdomen and pelvis was performed following the standard protocol during bolus administration of intravenous contrast.  CONTRAST:  180mL OMNIPAQUE IOHEXOL 300 MG/ML  SOLN  COMPARISON:  Chest radiograph 07/09/2013.  FINDINGS: CT CHEST FINDINGS  Mediastinal adenopathy measures up to 2.5 x 3.3 cm in the lower right paratracheal station. Subcarinal adenopathy measures 3.0 x 6.2 cm. Bi hilar adenopathy measures up to 3.6 x 4.3 cm on the left. No  axillary adenopathy. Atherosclerotic calcification of the arterial vasculature, including coronary arteries. Heart is enlarged. No pericardial effusion.  There is significant respiratory motion at the lung bases, limiting evaluation. A 2.5 x 4.0 cm mass is seen in the lingula (image 52). A spiculated nodule in the apical segment right upper lobe measures 0.5 x 1.2 cm (image 14). Centrilobular emphysema. Probable mild postobstructive ground-glass in the lingula and left lower lobe. No pleural fluid. Airway is otherwise unremarkable.  CT ABDOMEN AND PELVIS FINDINGS  Image quality is degraded by motion. Liver is unremarkable. There is intrahepatic and extrahepatic biliary duct dilatation with the extrahepatic duct duct measuring up to 9 mm. Gallbladder, adrenal glands, kidneys, spleen and pancreas are unremarkable.  There is masslike thickening in the proximal stomach, measuring approximately 3.4 x 5.1 cm (series 6, image 18). Stomach, small bowel and colon are otherwise grossly unremarkable. Bladder is grossly unremarkable. Ventral hernia contains fat. Atherosclerotic calcification of the arterial vasculature with the infrarenal aorta measuring up to 3.2 cm. There is significant streak artifact in the anatomic pelvis related to a right hip arthroplasty. No worrisome lytic or sclerotic lesions. Degenerative changes are seen in the spine. 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  IMPRESSION: 1. Image quality is markedly degraded by respiratory and patient motion. 2. Bulky mediastinal and bi hilar adenopathy with a spiculated right upper lobe nodule and lingular mass. 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 3. Suspect masslike thickening in the proximal stomach. Endoscopic evaluation may be helpful, as clinically indicated. 4. Intrahepatic and extrahepatic biliary duct dilatation without definite cause. 5. Coronary artery calcification. 6. Midline  ventral hernia contains fat. 7. Small infrarenal aortic aneurysm. 8. 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   Electronically Signed   By: Lorin Picket M.D.   On: 07/11/2013 16:42   Dg Chest Port 1 View  07/30/2013   CLINICAL DATA:  Cough, wheezing  EXAM: PORTABLE CHEST - 1 VIEW  COMPARISON:  07/11/2013 and earlier studies  FINDINGS: Bilateral hilar adenopathy. Progressive airspace consolidation in the basilar segments left lower lobe. New interstitial opacities around the left hilum and in the right infrahilar region. Probable left pleural effusion. . Atheromatous aorta.  Heart size upper limits normal. Degenerative changes in bilateral shoulders.  IMPRESSION: 1. Progressive consolidation/atelectasis in the left lower lung, with probable small effusion. 2. Persistent bilateral hilar adenopathy.   Electronically Signed   By: Arne Cleveland M.D.   On: 07/30/2013 18:43    Assessment/Plan  73 yo female with  new onset aflutter, copde, underlying cancer with pna  Principal Problem:   CAP (community acquired pneumonia)-  pna pathway.  Obtain sputum and bc.  Place on iv rocephin and azithro.    Active Problems:   Benign hypertensive heart disease without heart failure   Subdural hematoma, post-traumatic in 2013 after fall   Cough   Atrial flutter with rapid ventricular response-  dilt gtt, will orally load also tonight   COPD with acute exacerbation-  Iv solumedrol and freq nebs (xoponex)   Hilar adenopathy being evaluated by Dr. Earlie Server  Will also r/o PE with cta, as she is certainly high risk.  She is suppose to be getting biopsy set up soon for diagnosis, so this needs to be considered in starting long term anticoagulation for her aflutter, will start full dosing tonight if her cta shows any clot.  i have discussed her code status with her and her daughter (only child, no spouse), she wishes no cpr or intubation ever in the future regardless of circumstance.  She wishes to be  DNR, but wishes to pursue her options concerning w/u of her probable cancer.  Admit to tele floor.  Elford Evilsizer A Shanon Brow 07/30/2013, 7:20 PM

## 2013-07-30 NOTE — ED Notes (Signed)
Phlebotomy at bedside attempting to draw blood cultures

## 2013-07-30 NOTE — ED Notes (Signed)
Pt transported to CT, will call when ready to transport upstairs.

## 2013-07-30 NOTE — ED Notes (Signed)
Xray called about portable, enroute.

## 2013-07-30 NOTE — Progress Notes (Signed)
2000-2300 shift. Patient was transported by stretcher from the ED accompanied by ED NT and her daughter to the unit. She has IV with continuous fluid and is on room air. She is 1 person assist with ambulation. No c/o pain or signs of distress. MD on call with [facility], Dr.David was called about patient's orders for Cardizem po and continuous IV. Was told to titrate Cardizem from 78mL/hr to 26mL/hr and to also  give po Cardizem tonight.

## 2013-07-30 NOTE — Progress Notes (Signed)
Unit CM UR Completed by [facility] ED CM  W. Alabama Doig RN  

## 2013-07-30 NOTE — Progress Notes (Signed)
Patient ID: Emma Douglas MRN: [id number], DOB: [birth year], 73 y.o. Date of Encounter: 07/30/2013, 4:00 PM    Chief Complaint:  Chief Complaint  Patient presents with  . c/o severe wheezing x 2 weeks    getting worse     HPI: 73 y.o. year old AA female recently has had office visits with me. At recent visit with me we discussed that she had had unintentional weight loss. As well she is a smoker. As part of that evaluation she had a chest x-ray. This showed mass. She has had followup CT scan which also showed a mass consistent with cancer. She is in the process of having this further evaluated.  She presented today with the above symptoms. When nurse reported to me that she had the patient ready for me to see, she noted to me that when she did the pulse ox the reading was jumping from 70s to 90s. Also said that when she was checking blood pressure with machine the heart rate was jumping from 94 to 165.  When she told me this,  I recommended that she go ahead and obtain an EKG. EKG shows SVT at 166 beats per minute.  Patient is here with her daughter.  Discussed the EKG findings with them. Explained that she needs to be in the hospital on telemetry where they can give her appropriate IV medication. Discussed the need to call EMS for transfer. Daughter refuses [place] to call EMS and says that she can transport the patient to the hospital herself. She agrees to take her directly to [facility] Emergency Room with no stops between. I have given her a copy of the EKG to take directly to the triage nurse ASAP.     Home Meds:   Outpatient Prescriptions Prior to Visit  Medication Sig Dispense Refill  . dextromethorphan-guaiFENesin (MUCINEX DM) 30-600 MG per 12 hr tablet Take 1 tablet by mouth 2 (two) times daily as needed for cough.      . EPINEPHrine (EPI-PEN) 0.3 mg/0.3 mL SOAJ injection Inject 0.3 mLs (0.3 mg total) into the muscle once.  1 Device  2  . ibuprofen (ADVIL,MOTRIN) 100 MG  chewable tablet Chew 200 mg by mouth every 8 (eight) hours as needed.      Marland Kitchen levothyroxine (SYNTHROID, LEVOTHROID) 100 MCG tablet TAKE 1 TABLET BY MOUTH DAILY.  30 tablet  5  . lisinopril (PRINIVIL,ZESTRIL) 10 MG tablet Take 1 tablet (10 mg total) by mouth daily.  90 tablet  3  . Multiple Vitamin (MULTIVITAMIN) tablet Take 1 tablet by mouth daily.      Marland Kitchen OVER THE COUNTER MEDICATION Cough syrup      . simvastatin (ZOCOR) 40 MG tablet TAKE 1 TABLET BY MOUTH AT BEDTIME.  30 tablet  0  . traMADol (ULTRAM) 50 MG tablet Take 1 tablet (50 mg total) by mouth every 6 (six) hours as needed.  40 tablet  0   No facility-administered medications prior to visit.    Allergies:  Allergies  Allergen Reactions  . Black Pepper [Piper] Anaphylaxis  . Tomato Anaphylaxis    Has epi pen      Review of Systems: See HPI for pertinent ROS. All other ROS negative.    Physical Exam: Blood pressure 80/56, pulse 90, temperature 98.5 F (36.9 C), temperature source Oral, resp. rate 20, weight 103 lb (46.72 kg), SpO2 75.00%., Body mass index is 16.13 kg/(m^2). General:  Thin AAF. Appears in no acute distress. Neck: Supple.  Lungs: Rhonchi, Wheezing Heart: Regular rhythm. Tachycardic. Extremities/Skin: Warm and dry. Neuro: Alert and oriented X 3. Moves all extremities spontaneously. Gait is normal. CNII-XII grossly in tact. Psych: Slightly flattened affect.      ASSESSMENT AND PLAN:  73 y.o. year old female with  1. SVT (supraventricular tachycardia) Explained to the patient and the daughter that the patient is not stable. Discussed that she is in an abnormal heart rhythm with heart rate very elevated at 166.  Discussed that she is hypotensive at 80/56.  As well, she has lung mass and is having a lot of cough and shortness of breath. Discussed that she could become more unstable while in their vehicle. However, they both refused for me to call EMS. Daughter agrees to transport her directly to [facility]  Emergency Room ASAP and to go directly to triage nurse with EKG tracing.  2. Cough 3. Unintentional weight loss Recent Chest XRay and Chest CT with Mass c/w Cancer.   Marin Olp [name], [place], BSFM 07/30/2013 4:00 PM

## 2013-07-30 NOTE — ED Provider Notes (Signed)
TIME SEEN: 4:30 PM  CHIEF COMPLAINT: Coughing, wheezing, fast heart rate  HPI: Patient is a 73 yo F with history of hypertension, COPD, hyperlipidemia, tobacco use, hypothyroidism who presents to the emergency department from [facility] for elevated heart rate. She states that she went there today for wheezing and coughing has been present for the past 3 weeks it was worse last night. She did notice some palpitations today but denies any chest pain or shortness of breath. She states that [employer] she was noted to have a heart rate in the 160s.  She denies that she's had any fevers. No vomiting or diarrhea. No bloody stools or melena. Patient was recently diagnosed with possible lung cancer and was seen by Dr. Earlie Server last week. She has not yet had a biopsy.   PCP is Dena Billet PA at [facility]  ROS: See HPI Constitutional: no fever  Eyes: no drainage  ENT: no runny nose   Cardiovascular:  no chest pain  Resp: no SOB  GI: no vomiting GU: no dysuria Integumentary: no rash  Allergy: no hives  Musculoskeletal: no leg swelling  Neurological: no slurred speech ROS otherwise negative  PAST MEDICAL HISTORY/PAST SURGICAL HISTORY:  Past Medical History  Diagnosis Date  . Compensated hypothyroidism   . Essential hypertension   . Dyslipidemia   . Colon polyps   . Anxiety   . COPD (chronic obstructive pulmonary disease)     secondary to smoking.  . Avascular necrosis     right femoral head  . Hyperlipidemia   . Smoking history   . Anemia      Postoperative hemorrhagic anemia  . Allergic reaction 10/24/2012    MEDICATIONS:  Prior to Admission medications   Medication Sig Start Date End Date Taking? Authorizing Provider  dextromethorphan-guaiFENesin (MUCINEX DM) 30-600 MG per 12 hr tablet Take 1 tablet by mouth 2 (two) times daily as needed for cough.    Historical Provider, MD  EPINEPHrine (EPI-PEN) 0.3 mg/0.3 mL SOAJ injection Inject 0.3 mLs (0.3 mg total) into the  muscle once. 10/24/12   Lonie Peak Dixon, PA-C  ibuprofen (ADVIL,MOTRIN) 100 MG chewable tablet Chew 200 mg by mouth every 8 (eight) hours as needed.    Historical Provider, MD  levothyroxine (SYNTHROID, LEVOTHROID) 100 MCG tablet TAKE 1 TABLET BY MOUTH DAILY. 03/08/13   Darlin Coco, MD  lisinopril (PRINIVIL,ZESTRIL) 10 MG tablet Take 1 tablet (10 mg total) by mouth daily. 07/09/13   Orlena Sheldon, PA-C  Multiple Vitamin (MULTIVITAMIN) tablet Take 1 tablet by mouth daily.    Historical Provider, MD  OVER THE COUNTER MEDICATION Cough syrup    Historical Provider, MD  simvastatin (ZOCOR) 40 MG tablet TAKE 1 TABLET BY MOUTH AT BEDTIME.    Darlin Coco, MD  traMADol (ULTRAM) 50 MG tablet Take 1 tablet (50 mg total) by mouth every 6 (six) hours as needed. 07/29/13   Curt Bears, MD    ALLERGIES:  Allergies  Allergen Reactions  . Black Pepper [Piper] Anaphylaxis  . Tomato Anaphylaxis    Has epi pen    SOCIAL HISTORY:  History  Substance Use Topics  . Smoking status: Current Some Day Smoker -- 0.50 packs/day    Types: Cigarettes  . Smokeless tobacco: Never Used  . Alcohol Use: Yes    FAMILY HISTORY: Family History  Problem Relation Age of Onset  . Heart attack Mother   . Heart attack Father     EXAM: BP 97/67  Pulse 168  Temp(Src) 98.9 F (37.2 C) (Oral)  Resp 18 CONSTITUTIONAL: Alert and oriented and responds appropriately to questions. Well-appearing; well-nourished HEAD: Normocephalic EYES: Conjunctivae clear, PERRL ENT: normal nose; no rhinorrhea; moist mucous membranes; pharynx without lesions noted NECK: Supple, no meningismus, no LAD  CARD:  tachycardic and regular ; S1 and S2 appreciated; no murmurs, no clicks, no rubs, no gallops RESP: Normal chest excursion without splinting or tachypnea; breath sounds  equal bilaterally but there is diffuse wheezing and rhonchi, no rales, no respiratory distress or hypoxia  ABD/GI: Normal bowel sounds; non-distended; soft,  non-tender, no rebound, no guarding BACK:  The back appears normal and is non-tender to palpation, there is no CVA tenderness EXT: Normal ROM in all joints; non-tender to palpation; no edema; normal capillary refill; no cyanosis    SKIN: Normal color for age and race; warm NEURO: Moves all extremities equally, sensation to light touch intact diffusely, cranial nerves II through XII intact  PSYCH: The patient's mood and manner are appropriate. Grooming and personal hygiene are appropriate.  MEDICAL DECISION MAKING:  Patient here with wheezing, cough and now atrial flutter with 2:1 conduction. Patient denies a history of arrhythmia. She is not on anticoagulation. She adamantly denies to me any chest pain, chest discomfort or shortness of breath despite triage note.  We'll obtain cardiac labs, chest x-ray. We'll give Xopenex breathing treatment and started on diltiazem drip. We'll give IV fluids.  ED PROGRESS: Patient's labs are unremarkable other than a BNP greater than 2000. Chest x-ray pending. Her heart rate has improved into the EEGs now still in atrial flutter and her blood pressure is 124/76. Her lungs have improved but not completely clear after one Xopenex treatment. Given her heart rate is improved, we'll give duo neb. We'll also give Solu-Medrol for COPD exacerbation. We'll admit once chest x-ray is back.   7:02 PM  Pt's CXR shows progressive consolidation in the left lower lung. She does have a leukocytosis of 14.1 with left shift. Blood cultures, lactate pending. Will treat with ceftriaxone and azithromycin for community-acquired pneumonia. We'll discuss with medicine for admission. She is still hemodynamically stable.    Date: 07/30/2013 17:02  Rate: 163  Rhythm: Atrial flutter with 2:1 conduction  QRS Axis: normal  Intervals: normal  ST/T Wave abnormalities: normal  Conduction Disutrbances: none  Narrative Interpretation: Atrial flutter with 2:1 conduction    CRITICAL  CARE Performed by: Delice Bison Shaunessy Dobratz   Total critical care time: 45 minutes  Critical care time was exclusive of separately billable procedures and treating other patients.  Critical care was necessary to treat or prevent imminent or life-threatening deterioration.  Critical care was time spent personally by me on the following activities: development of treatment plan with patient and/or surrogate as well as nursing, discussions with consultants, evaluation of patient's response to treatment, examination of patient, obtaining history from patient or surrogate, ordering and performing treatments and interventions, ordering and review of laboratory studies, ordering and review of radiographic studies, pulse oximetry and re-evaluation of patient's condition.       [place], DO 07/30/13 2356

## 2013-07-31 ENCOUNTER — Encounter (HOSPITAL_COMMUNITY): Payer: Self-pay | Admitting: General Practice

## 2013-07-31 DIAGNOSIS — K3189 Other diseases of stomach and duodenum: Secondary | ICD-10-CM | POA: Diagnosis present

## 2013-07-31 DIAGNOSIS — E871 Hypo-osmolality and hyponatremia: Secondary | ICD-10-CM

## 2013-07-31 DIAGNOSIS — J9600 Acute respiratory failure, unspecified whether with hypoxia or hypercapnia: Secondary | ICD-10-CM

## 2013-07-31 LAB — CBC WITH DIFFERENTIAL/PLATELET
Basophils Absolute: 0 10*3/uL (ref 0.0–0.1)
Basophils Relative: 0 % (ref 0–1)
EOS PCT: 0 % (ref 0–5)
Eosinophils Absolute: 0 10*3/uL (ref 0.0–0.7)
HEMATOCRIT: 35.8 % — AB (ref 36.0–46.0)
Hemoglobin: 12 g/dL (ref 12.0–15.0)
LYMPHS ABS: 0.6 10*3/uL — AB (ref 0.7–4.0)
Lymphocytes Relative: 5 % — ABNORMAL LOW (ref 12–46)
MCH: 32.8 pg (ref 26.0–34.0)
MCHC: 33.5 g/dL (ref 30.0–36.0)
MCV: 97.8 fL (ref 78.0–100.0)
MONO ABS: 0.2 10*3/uL (ref 0.1–1.0)
Monocytes Relative: 2 % — ABNORMAL LOW (ref 3–12)
Neutro Abs: 11.8 10*3/uL — ABNORMAL HIGH (ref 1.7–7.7)
Neutrophils Relative %: 93 % — ABNORMAL HIGH (ref 43–77)
PLATELETS: 376 10*3/uL (ref 150–400)
RBC: 3.66 MIL/uL — AB (ref 3.87–5.11)
RDW: 13.8 % (ref 11.5–15.5)
WBC: 12.6 10*3/uL — AB (ref 4.0–10.5)

## 2013-07-31 LAB — BASIC METABOLIC PANEL
BUN: 11 mg/dL (ref 6–23)
CALCIUM: 7.9 mg/dL — AB (ref 8.4–10.5)
CO2: 20 mEq/L (ref 19–32)
Chloride: 95 mEq/L — ABNORMAL LOW (ref 96–112)
Creatinine, Ser: 0.56 mg/dL (ref 0.50–1.10)
GFR calc Af Amer: >90 mL/min (ref >90)
GFR calc non Af Amer: 90 mL/min — ABNORMAL LOW (ref >90)
GLUCOSE: 434 mg/dL — AB (ref 70–99)
Potassium: 3.7 mEq/L (ref 3.7–5.3)
Sodium: 128 mEq/L — ABNORMAL LOW (ref 137–147)

## 2013-07-31 LAB — EXPECTORATED SPUTUM ASSESSMENT W GRAM STAIN, RFLX TO RESP C

## 2013-07-31 LAB — SODIUM, URINE, RANDOM: Sodium, Ur: <20 mEq/L

## 2013-07-31 LAB — EXPECTORATED SPUTUM ASSESSMENT W REFEX TO RESP CULTURE

## 2013-07-31 LAB — MRSA PCR SCREENING: MRSA by PCR: NEGATIVE

## 2013-07-31 LAB — OSMOLALITY: Osmolality: 277 mOsm/kg (ref 275–300)

## 2013-07-31 LAB — STREP PNEUMONIAE URINARY ANTIGEN: Strep Pneumo Urinary Antigen: NEGATIVE

## 2013-07-31 LAB — CREATININE, URINE, RANDOM: Creatinine, Urine: 73.65 mg/dL

## 2013-07-31 MED ORDER — NICOTINE 14 MG/24HR TD PT24
14.0000 mg | MEDICATED_PATCH | Freq: Every day | TRANSDERMAL | Status: DC
  Administered 2013-07-31 – 2013-08-06 (×6): 14 mg via TRANSDERMAL
  Filled 2013-07-31 (×7): qty 1

## 2013-07-31 MED ORDER — SODIUM CHLORIDE 0.9 % IV SOLN: 250.0000 mL | INTRAVENOUS | Status: AC | PRN

## 2013-07-31 MED ORDER — GUAIFENESIN-DM 100-10 MG/5ML PO SYRP
5.0000 mL | ORAL_SOLUTION | ORAL | Status: DC | PRN
  Administered 2013-07-31 – 2013-08-05 (×13): 5 mL via ORAL
  Filled 2013-07-31 (×15): qty 5

## 2013-07-31 MED ORDER — ENSURE COMPLETE PO LIQD
237.0000 mL | Freq: Two times a day (BID) | ORAL | Status: DC
  Administered 2013-07-31 – 2013-08-06 (×10): 237 mL via ORAL

## 2013-07-31 NOTE — Progress Notes (Signed)
[facility] PROGRESS NOTE Assessment/Plan: Acute respiratory failure due Post obstructive CAP (community acquired pneumonia): - Started on Rocephin and azithromycin on 5.28. - has remained afebrile, still with leukocytosis ( ? If leukocytosis due to steroids and/or gastric and lung mass.) - will have to get tissue biopsy, see below. - Will probably need radiation to lung mass  Lung mass/gastric mass/  Hilar adenopathy being evaluated by Dr. Earlie Server: - consult Gi for possible EGD and biopsy.  Atrial flutter with rapid ventricular response - Started on diltiazem gtt, transition to oral. - now rate controlled.  COPD with acute exacerbation - IV solumedrol and IV steroids.  Benign hypertensive heart disease without heart failure - stable   Hyponatremia: - Urine osmolarity, urine Sodium and Creatinine. - Start IV fluids, b-met  Code status: DNR/DNI Family Communication: daughter  Disposition Plan: inpatinet   Consultants:  GI  Procedures:  EGD in am 5.29.2015  Antibiotics:  Rocephin and azithro  HPI/Subjective: No complains  Objective: Filed Vitals:   07/30/13 1845 07/30/13 1900 07/30/13 2057 07/31/13 0347  BP: 114/37 113/68 128/65 122/51  Pulse: 83 84 83 76  Temp:   98 F (36.7 C) 98 F (36.7 C)  TempSrc:   Oral Oral  Resp: '24 17 21 22  ' Height:  '5\' 7"'  (1.702 m)    Weight:  46.72 kg (103 lb) 47.401 kg (104 lb 8 oz) 47.9 kg (105 lb 9.6 oz)  SpO2: 92% 98% 94% 100%    Intake/Output Summary (Last 24 hours) at 07/31/13 1048 Last data filed at 07/31/13 0839  Gross per 24 hour  Intake   1960 ml  Output    475 ml  Net   1485 ml   Filed Weights   07/30/13 1900 07/30/13 2057 07/31/13 0347  Weight: 46.72 kg (103 lb) 47.401 kg (104 lb 8 oz) 47.9 kg (105 lb 9.6 oz)    Exam:  General: Alert, awake, oriented x3, in no acute distress. cachectic HEENT: No bruits, no goiter.  Heart: Regular rate and rhythm, without murmurs, rubs, gallops.  Lungs:  Good air movement, clear Abdomen: Soft, nontender, nondistended, positive bowel sounds.    Data Reviewed: Basic Metabolic Panel:  Recent Labs Lab 07/29/13 1111 07/30/13 1642 07/30/13 2200 07/31/13 0401  NA 134* 135*  --  128*  K 3.8 4.6  --  3.7  CL  --  94*  --  95*  CO2 25 25  --  20  GLUCOSE 118 108*  --  434*  BUN 12.0 16  --  11  CREATININE 0.8 0.84 0.61 0.56  CALCIUM 10.3 11.2*  --  7.9*  MG  --  1.6  --   --    Liver Function Tests:  Recent Labs Lab 07/29/13 1111  AST 22  ALT 10  ALKPHOS 102  BILITOT 0.62  PROT 7.1  ALBUMIN 2.7*   No results found for this basename: LIPASE, AMYLASE,  in the last 168 hours No results found for this basename: AMMONIA,  in the last 168 hours CBC:  Recent Labs Lab 07/29/13 1111 07/30/13 1642 07/30/13 2200 07/31/13 0401  WBC 12.6* 14.1* 12.7* 12.6*  NEUTROABS 10.1* 11.3*  --  11.8*  HGB 13.2 14.4 12.1 12.0  HCT 39.4 41.3 36.5 35.8*  MCV 98.7 97.9 98.9 97.8  PLT 419* 422* 384 376   Cardiac Enzymes: No results found for this basename: CKTOTAL, CKMB, CKMBINDEX, TROPONINI,  in the last 168 hours BNP (last 3 results)  Recent Labs  07/30/13 1644  PROBNP 2688.0*   CBG:  Recent Labs Lab 07/30/13 2141  GLUCAP 128*    Recent Results (from the past 240 hour(s))  MRSA PCR SCREENING     Status: None   Collection Time    07/30/13  9:59 PM      Result Value Ref Range Status   MRSA by PCR NEGATIVE  NEGATIVE Final   Comment:            The GeneXpert MRSA Assay (FDA     approved for NASAL specimens     only), is one component of a     comprehensive MRSA colonization     surveillance program. It is not     intended to diagnose MRSA     infection nor to guide or     monitor treatment for     MRSA infections.     Studies: Ct Angio Chest Pe W/cm &/or Wo Cm  07/30/2013   CLINICAL DATA:  Shortness of breath and cough.  Atrial flutter.  EXAM: CT ANGIOGRAPHY CHEST WITH CONTRAST  TECHNIQUE: Multidetector CT imaging of  the chest was performed using the standard protocol during bolus administration of intravenous contrast. Multiplanar CT image reconstructions and MIPs were obtained to evaluate the vascular anatomy.  CONTRAST:  127m OMNIPAQUE IOHEXOL 350 MG/ML SOLN  COMPARISON:  Chest x-ray 07/30/2013 CT scan dated 07/11/2013  FINDINGS: There are no definitive pulmonary emboli. There has been marked progression of mediastinal tumor as well as the hilar tumor masses as well as the tumor in the lingula. There is extensive spread of tumor into the left lower lobe. Tumor encases and occludes peripheral arteries in the left lower lobe. There is now occlusion of the right middle lobe bronchus and of the left lower lobe bronchus with almost complete occlusion of the left upper lobe bronchus and with marked narrowing of the right lower lobe bronchus.  There is new hazy infiltrate in the inferior aspect of the right upper lobe. There is new loculated pleural fluid posterior to the left upper lobe.  The tumor compresses the pulmonary arteries bilaterally. The tumor extends posterior to the left atrium and has a mass effect upon the left atrium.  No acute osseous abnormalities.  Review of the MIP images confirms the above findings.  IMPRESSION: 1. Marked progression of mediastinal and hilar tumor with with new extensive tumor extension into the left lower lobe and enlargement of the mass in the lingula. The small irregular mass in the right upper lobe is unchanged.  2. New obstruction of the left lower lobe bronchus, near obstruction of the left upper lobe bronchus, complete obstruction of the right middle lobe bronchus, and severe narrowing of the right lower lobe bronchus.   Electronically Signed   By: JRozetta NunneryM.D.   On: 07/30/2013 20:46   Dg Chest Port 1 View  07/30/2013   CLINICAL DATA:  Cough, wheezing  EXAM: PORTABLE CHEST - 1 VIEW  COMPARISON:  07/11/2013 and earlier studies  FINDINGS: Bilateral hilar adenopathy. Progressive  airspace consolidation in the basilar segments left lower lobe. New interstitial opacities around the left hilum and in the right infrahilar region. Probable left pleural effusion. . Atheromatous aorta.  Heart size upper limits normal. Degenerative changes in bilateral shoulders.  IMPRESSION: 1. Progressive consolidation/atelectasis in the left lower lung, with probable small effusion. 2. Persistent bilateral hilar adenopathy.   Electronically Signed   By: DArne ClevelandM.D.   On: 07/30/2013 18:43  Scheduled Meds: . atorvastatin  20 mg Oral QHS  . azithromycin  500 mg Intravenous Q24H  . cefTRIAXone (ROCEPHIN)  IV  1 g Intravenous Q24H  . diltiazem  30 mg Oral 3 times per day  . enoxaparin (LOVENOX) injection  40 mg Subcutaneous Q24H  . levothyroxine  100 mcg Oral QAC breakfast  . lisinopril  10 mg Oral Daily  . methylPREDNISolone (SOLU-MEDROL) injection  80 mg Intravenous Q12H  . sodium chloride  3 mL Intravenous Q12H   Continuous Infusions:    [facility] Pager [phone]. If 8PM-8AM, please contact night-coverage at [email or web address], password [facility] 07/31/2013, 10:48 AM  LOS: 1 day

## 2013-07-31 NOTE — Progress Notes (Signed)
INITIAL NUTRITION ASSESSMENT  DOCUMENTATION CODES Per approved criteria  -Underweight   INTERVENTION: Provide Ensure Complete BID Provide Magic Cup Ice cream BID Encourage PO intake  NUTRITION DIAGNOSIS: Inadequate oral intake related to varied appetite as evidenced by underweight BMI.   Goal: Pt to meet >/= 90% of their estimated nutrition needs   Monitor:  PO intake, weight trends, labs  Reason for Assessment: Malnutrition Screening Tool, score of 2  73 y.o. female  Admitting Dx: CAP (community acquired pneumonia)  ASSESSMENT: 73 yo female h/o htn, copd, subdural hematoma from fall in 2013, went to see pcp today for cough and her heart rate was 160. She denies any cp or palpitations. She has been coughing a lot for weeks and progressive worsening wheezing for a week also. 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 She has been set up for outpt PET/mri brain which is scheduled for next week.   Pt states that she used to weight 168 lbs years ago. She states she lost down to 104 lbs due to eating poorly and then regained some. She states that she has been eating fairly well and that her appetite is good. She reports losing more weight in the past 2 weeks and that she was drinking Ensure shakes PTA. Pt describes her appetite as good. Per nursing notes she ate 90% of breakfast but, only 10% of lunch.   Nutrition Focused Physical Exam:  Subcutaneous Fat:  Orbital Region: mild wasting Upper Arm Region: mild wasting Thoracic and Lumbar Region: NA  Muscle:  Temple Region: moderate wasting Clavicle Bone Region: moderate/severe wasting Clavicle and Acromion Bone Region: moderate wasting Scapular Bone Region: NA Dorsal Hand: mild wasting Patellar Region: moderate wasting Anterior Thigh Region: severe wasting Posterior Calf Region: moderate wasting  Edema: none noted   Height: Ht  Readings from Last 1 Encounters:  07/30/13 5\' 7"  (1.702 m)    Weight: Wt Readings from Last 1 Encounters:  07/31/13 105 lb 9.6 oz (47.9 kg)    Ideal Body Weight: 135 lbs  % Ideal Body Weight: 78%  Wt Readings from Last 10 Encounters:  07/31/13 105 lb 9.6 oz (47.9 kg)  07/30/13 103 lb (46.72 kg)  07/29/13 102 lb 12.8 oz (46.63 kg)  07/09/13 108 lb (48.988 kg)  07/02/13 104 lb (47.174 kg)  10/24/12 122 lb (55.339 kg)  10/08/12 125 lb 12.8 oz (57.063 kg)  04/10/12 130 lb (58.968 kg)  10/30/11 137 lb 2 oz (62.2 kg)  01/13/11 146 lb 1.9 oz (66.28 kg)    Usual Body Weight: unknown  % Usual Body Weight: NA  BMI:  Body mass index is 16.54 kg/(m^2). (underweight)  Estimated Nutritional Needs: Kcal: 1600-1800 Protein: 60-70 grams Fluid: 1.6 L/day  Skin: intact  Diet Order: General  EDUCATION NEEDS: -No education needs identified at this time   Intake/Output Summary (Last 24 hours) at 07/31/13 1407 Last data filed at 07/31/13 1318  Gross per 24 hour  Intake   2320 ml  Output    475 ml  Net   1845 ml    Last BM: 5/27  Labs:   Recent Labs Lab 07/29/13 1111 07/30/13 1642 07/30/13 2200 07/31/13 0401  NA 134* 135*  --  128*  K 3.8 4.6  --  3.7  CL  --  94*  --  95*  CO2 25 25  --  20  BUN 12.0 16  --  11  CREATININE 0.8 0.84 0.61 0.56  CALCIUM 10.3 11.2*  --  7.9*  MG  --  1.6  --   --   GLUCOSE 118 108*  --  434*    CBG (last 3)   Recent Labs  07/30/13 2141  GLUCAP 128*    Scheduled Meds: . atorvastatin  20 mg Oral QHS  . azithromycin  500 mg Intravenous Q24H  . cefTRIAXone (ROCEPHIN)  IV  1 g Intravenous Q24H  . diltiazem  30 mg Oral 3 times per day  . enoxaparin (LOVENOX) injection  40 mg Subcutaneous Q24H  . levothyroxine  100 mcg Oral QAC breakfast  . lisinopril  10 mg Oral Daily  . methylPREDNISolone (SOLU-MEDROL) injection  80 mg Intravenous Q12H  . nicotine  14 mg Transdermal Daily  . sodium chloride  3 mL Intravenous Q12H     Continuous Infusions:   Past Medical History  Diagnosis Date  . Compensated hypothyroidism   . Essential hypertension   . Dyslipidemia   . Colon polyps   . Anxiety   . COPD (chronic obstructive pulmonary disease)     secondary to smoking.  . Avascular necrosis     right femoral head  . Hyperlipidemia   . Smoking history   . Anemia      Postoperative hemorrhagic anemia  . Allergic reaction 10/24/2012    Past Surgical History  Procedure Laterality Date  . Colonoscopy      with polypectomy and biopsy  . Total hip arthroplasty      Pryor Ochoa RD, LDN Inpatient Clinical Dietitian Pager: [phone] After Hours Pager: [phone]

## 2013-07-31 NOTE — Evaluation (Signed)
Physical Therapy Evaluation Patient Details Name: Emma Douglas MRN: [id number] DOB: 08-Jul-1940 Today's Date: 07/31/2013   History of Present Illness  Pt is a 73 y/o female admitted s/p a visit to her PCP for a cough and HR was 160 in office.  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  Clinical Impression  Pt admitted with the above. Pt currently with functional limitations due to the deficits listed below (see PT Problem List). At the time of PT eval pt was able to perform transfers and ambulation with steadying assist only and cues for pursed-lip breathing. Pt will benefit from skilled PT to increase their independence and safety with mobility to allow discharge to the venue listed below.     Follow Up Recommendations Home health PT;Supervision for mobility/OOB    Equipment Recommendations  Rolling walker with 5" wheels;3in1 (PT);Other (comment) (Tub bench)    Recommendations for Other Services       Precautions / Restrictions Precautions Precautions: Fall Restrictions Weight Bearing Restrictions: No      Mobility  Bed Mobility Overal bed mobility: Needs Assistance Bed Mobility: Supine to Sit     Supine to sit: Min guard     General bed mobility comments: VC's for sequencing and technique. Bed rail use for support and steadying assist for trunk as pt transitioned to full sitting position.   Transfers Overall transfer level: Needs assistance Equipment used: Rolling walker (2 wheeled) Transfers: Sit to/from Stand Sit to Stand: Min guard         General transfer comment: VC's for hand placement on seated surface for safety. Pt able to power-up to full standing without physical assist.   Ambulation/Gait Ambulation/Gait assistance: Min guard Ambulation Distance (Feet): 75 Feet Assistive device: Rolling walker (2 wheeled) Gait Pattern/deviations:  Step-through pattern;Decreased stride length;Trunk flexed Gait velocity: Decreased Gait velocity interpretation: Below normal speed for age/gender General Gait Details: No LOB or unsteadiness noted. VC's for pursed-lip breathing throughout ambulation. Pt on RA throughout gait training and sats decreased to 88%. Upon seated rest break in room sats lowered to 86% and pt was able to improve to low 90's with pursed-lip breathing within 2 minutes.   Stairs            Wheelchair Mobility    Modified Rankin (Stroke Patients Only)       Balance Overall balance assessment: Needs assistance Sitting-balance support: Feet supported Sitting balance-Leahy Scale: Fair     Standing balance support: Bilateral upper extremity supported Standing balance-Leahy Scale: Fair                               Pertinent Vitals/Pain See gait training comments for O2 sats    Home Living Family/patient expects to be discharged to:: Private residence Living Arrangements: Children Available Help at Discharge: Family;Available 24 hours/day Type of Home: House Home Access: Stairs to enter Entrance Stairs-Rails: None Entrance Stairs-Number of Steps: 4 Home Layout: Two level;Able to live on main level with bedroom/bathroom Home Equipment: Gilford Rile - 2 wheels      Prior Function Level of Independence: Needs assistance   Gait / Transfers Assistance Needed: Daughter's assist to ambulate  ADL's / Homemaking Assistance Needed: Daughter's assist to get in/out of tub        Hand Dominance   Dominant Hand: Right    Extremity/Trunk Assessment  Upper Extremity Assessment: Defer to OT evaluation           Lower Extremity Assessment: Generalized weakness      Cervical / Trunk Assessment: Kyphotic  Communication   Communication: HOH  Cognition Arousal/Alertness: Awake/alert Behavior During Therapy: WFL for tasks assessed/performed Overall Cognitive Status: Within Functional Limits  for tasks assessed                      General Comments      Exercises        Assessment/Plan    PT Assessment Patient needs continued PT services  PT Diagnosis Difficulty walking;Generalized weakness   PT Problem List Decreased strength;Decreased range of motion;Decreased activity tolerance;Decreased balance;Decreased mobility;Decreased knowledge of use of DME;Decreased safety awareness;Decreased knowledge of precautions;Cardiopulmonary status limiting activity  PT Treatment Interventions DME instruction;Gait training;Stair training;Functional mobility training;Therapeutic activities;Therapeutic exercise;Neuromuscular re-education;Patient/family education   PT Goals (Current goals can be found in the Care Plan section) Acute Rehab PT Goals Patient Stated Goal: To return home PT Goal Formulation: With patient/family Time For Goal Achievement: 08/14/13 Potential to Achieve Goals: Good    Frequency Min 3X/week   Barriers to discharge        Co-evaluation               End of Session Equipment Utilized During Treatment: Gait belt Activity Tolerance: Patient tolerated treatment well Patient left: in chair;with call bell/phone within reach;with family/visitor present Nurse Communication: Mobility status         Time: 0937-1006 PT Time Calculation (min): 29 min   Charges:   PT Evaluation $Initial PT Evaluation Tier I: 1 Procedure PT Treatments $Gait Training: 8-22 mins $Neuromuscular Re-education: 8-22 mins   PT G Codes:          Jolyn Lent 07/31/2013, 3:33 PM  Jolyn Lent, PT, DPT Acute Rehabilitation Services Pager: [phone]

## 2013-07-31 NOTE — Consult Note (Addendum)
Referring Provider: Dr. Aileen Fass Primary Care Physician:  Karis Juba, PA-C Primary Gastroenterologist:  Dr. Penelope Coop  Reason for Consultation:  Gastric mass  HPI: Emma Douglas is a 73 y.o. female with recently noted lung mass (left lingular - no tissue diagnosis as of this time) on a CT in early May (07/11/13) and a gastric mass was also noted on that CT. A 3.4 X 5.1 cm mass noted in the proximal stomach on that CT along with mediastinal lymphadenopathy. She does have early satiety with eating and has had a 40 pound weight loss. She denies N/V/abdominal pain. Reports severe wheezing, cough, and shortness of breath. Denies melena, hematochezia, dysphagia. Saw Dr. Julien Nordmann on 07/29/13 who had planned for her to see Dr. Penelope Coop for an EGD and biopsy of the gastric mass, additional imaging with a PET scan and MRI, and if that was unrevealing plan for biopsy of the lung mass.    Past Medical History  Diagnosis Date  . Compensated hypothyroidism   . Essential hypertension   . Dyslipidemia   . Colon polyps   . Anxiety   . COPD (chronic obstructive pulmonary disease)     secondary to smoking.  . Avascular necrosis     right femoral head  . Hyperlipidemia   . Smoking history   . Anemia      Postoperative hemorrhagic anemia  . Allergic reaction 10/24/2012    Past Surgical History  Procedure Laterality Date  . Colonoscopy      with polypectomy and biopsy  . Total hip arthroplasty      Prior to Admission medications   Medication Sig Start Date End Date Taking? Authorizing Provider  EPINEPHrine (EPI-PEN) 0.3 mg/0.3 mL SOAJ injection Inject 0.3 mLs (0.3 mg total) into the muscle once. 10/24/12  Yes Mary B Dixon, PA-C  ibuprofen (ADVIL,MOTRIN) 100 MG chewable tablet Chew 200 mg by mouth every 8 (eight) hours as needed for mild pain.    Yes Historical Provider, MD  levothyroxine (SYNTHROID, LEVOTHROID) 100 MCG tablet Take 100 mcg by mouth daily before breakfast.   Yes Historical Provider,  MD  lisinopril (PRINIVIL,ZESTRIL) 10 MG tablet Take 1 tablet (10 mg total) by mouth daily. 07/09/13  Yes Orlena Sheldon, PA-C  Multiple Vitamin (MULTIVITAMIN) tablet Take 1 tablet by mouth daily.   Yes Historical Provider, MD  simvastatin (ZOCOR) 40 MG tablet Take 40 mg by mouth at bedtime.   Yes Historical Provider, MD  traMADol (ULTRAM) 50 MG tablet Take 1 tablet (50 mg total) by mouth every 6 (six) hours as needed. 07/29/13  Yes Curt Bears, MD    Scheduled Meds: . atorvastatin  20 mg Oral QHS  . azithromycin  500 mg Intravenous Q24H  . cefTRIAXone (ROCEPHIN)  IV  1 g Intravenous Q24H  . diltiazem  30 mg Oral 3 times per day  . feeding supplement (ENSURE COMPLETE)  237 mL Oral BID BM  . levothyroxine  100 mcg Oral QAC breakfast  . lisinopril  10 mg Oral Daily  . methylPREDNISolone (SOLU-MEDROL) injection  80 mg Intravenous Q12H  . nicotine  14 mg Transdermal Daily  . sodium chloride  3 mL Intravenous Q12H   Continuous Infusions:  PRN Meds:.sodium chloride, guaiFENesin-dextromethorphan, ibuprofen, levalbuterol, sodium chloride  Allergies as of 07/30/2013 - Review Complete 07/30/2013  Allergen Reaction Noted  . Black pepper [piper] Anaphylaxis 07/29/2013  . Tomato Anaphylaxis 07/29/2013    Family History  Problem Relation Age of Onset  . Heart attack Mother   .  Heart attack Father     History   Social History  . Marital Status: Divorced    Spouse Name: N/A    Number of Children: N/A  . Years of Education: N/A   Occupational History  . Not on file.   Social History Main Topics  . Smoking status: Current Some Day Smoker -- 0.50 packs/day    Types: Cigarettes  . Smokeless tobacco: Never Used  . Alcohol Use: Yes  . Drug Use: No  . Sexual Activity: Yes   Other Topics Concern  . Not on file   Social History Narrative  . No narrative on file    Review of Systems: All negative from GI standpoint except as stated above in HPI.  Physical Exam: Vital signs: Filed  Vitals:   07/31/13 1352  BP: 126/66  Pulse: 76  Temp: 98.2 F (36.8 C)  Resp: 18   Last BM Date: 07/30/13 General:   Cachetic, awake, mild acute distress Lungs:  Diffuse crackles, ronchi, and wheezing Heart:  Regular rate and rhythm; no murmurs, clicks, rubs,  or gallops. Abdomen: thin, soft, nontender, nondistended, +BS  Rectal:  Deferred Ext: no edema  GI:  Lab Results:  Recent Labs  07/30/13 1642 07/30/13 2200 07/31/13 0401  WBC 14.1* 12.7* 12.6*  HGB 14.4 12.1 12.0  HCT 41.3 36.5 35.8*  PLT 422* 384 376   BMET  Recent Labs  07/29/13 1111 07/30/13 1642 07/30/13 2200 07/31/13 0401  NA 134* 135*  --  128*  K 3.8 4.6  --  3.7  CL  --  94*  --  95*  CO2 25 25  --  20  GLUCOSE 118 108*  --  434*  BUN 12.0 16  --  11  CREATININE 0.8 0.84 0.61 0.56  CALCIUM 10.3 11.2*  --  7.9*   LFT  Recent Labs  07/29/13 1111  PROT 7.1  ALBUMIN 2.7*  AST 22  ALT 10  ALKPHOS 102  BILITOT 0.62   PT/INR  Recent Labs  07/30/13 1642  LABPROT 12.6  INR 0.96     Studies/Results: Ct Angio Chest Pe W/cm &/or Wo Cm  07/30/2013   CLINICAL DATA:  Shortness of breath and cough.  Atrial flutter.  EXAM: CT ANGIOGRAPHY CHEST WITH CONTRAST  TECHNIQUE: Multidetector CT imaging of the chest was performed using the standard protocol during bolus administration of intravenous contrast. Multiplanar CT image reconstructions and MIPs were obtained to evaluate the vascular anatomy.  CONTRAST:  140mL OMNIPAQUE IOHEXOL 350 MG/ML SOLN  COMPARISON:  Chest x-ray 07/30/2013 CT scan dated 07/11/2013  FINDINGS: There are no definitive pulmonary emboli. There has been marked progression of mediastinal tumor as well as the hilar tumor masses as well as the tumor in the lingula. There is extensive spread of tumor into the left lower lobe. Tumor encases and occludes peripheral arteries in the left lower lobe. There is now occlusion of the right middle lobe bronchus and of the left lower lobe bronchus  with almost complete occlusion of the left upper lobe bronchus and with marked narrowing of the right lower lobe bronchus.  There is new hazy infiltrate in the inferior aspect of the right upper lobe. There is new loculated pleural fluid posterior to the left upper lobe.  The tumor compresses the pulmonary arteries bilaterally. The tumor extends posterior to the left atrium and has a mass effect upon the left atrium.  No acute osseous abnormalities.  Review of the MIP images confirms the above  findings.  IMPRESSION: 1. Marked progression of mediastinal and hilar tumor with with new extensive tumor extension into the left lower lobe and enlargement of the mass in the lingula. The small irregular mass in the right upper lobe is unchanged.  2. New obstruction of the left lower lobe bronchus, near obstruction of the left upper lobe bronchus, complete obstruction of the right middle lobe bronchus, and severe narrowing of the right lower lobe bronchus.   Electronically Signed   By: Rozetta Nunnery M.D.   On: 07/30/2013 20:46   Dg Chest Port 1 View  07/30/2013   CLINICAL DATA:  Cough, wheezing  EXAM: PORTABLE CHEST - 1 VIEW  COMPARISON:  07/11/2013 and earlier studies  FINDINGS: Bilateral hilar adenopathy. Progressive airspace consolidation in the basilar segments left lower lobe. New interstitial opacities around the left hilum and in the right infrahilar region. Probable left pleural effusion. . Atheromatous aorta.  Heart size upper limits normal. Degenerative changes in bilateral shoulders.  IMPRESSION: 1. Progressive consolidation/atelectasis in the left lower lung, with probable small effusion. 2. Persistent bilateral hilar adenopathy.   Electronically Signed   By: Arne Cleveland M.D.   On: 07/30/2013 18:43    Impression/Plan: 73 yo with a lung mass and gastric mass without a primary determined as of yet. 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 EGD tomorrow morning by Dr. Watt Climes for biopsies of the mass noted on  CT. Supportive care. NPO p MN.    LOS: 1 day   Lear Ng  07/31/2013, 5:27 PM

## 2013-08-01 ENCOUNTER — Inpatient Hospital Stay (HOSPITAL_COMMUNITY): Payer: Medicare Other | Admitting: Anesthesiology

## 2013-08-01 ENCOUNTER — Telehealth: Payer: Self-pay | Admitting: Family Medicine

## 2013-08-01 ENCOUNTER — Encounter (HOSPITAL_COMMUNITY): Payer: Self-pay | Admitting: Anesthesiology

## 2013-08-01 ENCOUNTER — Encounter (HOSPITAL_COMMUNITY)
Admission: EM | Disposition: A | Discharge status: Home Health | Payer: Self-pay | Source: Home / Self Care | Attending: Internal Medicine

## 2013-08-01 ENCOUNTER — Telehealth: Payer: Self-pay | Admitting: Internal Medicine

## 2013-08-01 DIAGNOSIS — R222 Localized swelling, mass and lump, trunk: Secondary | ICD-10-CM

## 2013-08-01 DIAGNOSIS — I10 Essential (primary) hypertension: Secondary | ICD-10-CM

## 2013-08-01 DIAGNOSIS — N179 Acute kidney failure, unspecified: Secondary | ICD-10-CM

## 2013-08-01 HISTORY — PX: ESOPHAGOGASTRODUODENOSCOPY (EGD) WITH PROPOFOL: SHX5813

## 2013-08-01 LAB — BASIC METABOLIC PANEL
BUN: 15 mg/dL (ref 6–23)
CALCIUM: 9.9 mg/dL (ref 8.4–10.5)
CO2: 25 mEq/L (ref 19–32)
CREATININE: 0.58 mg/dL (ref 0.50–1.10)
Chloride: 96 mEq/L (ref 96–112)
GFR calc non Af Amer: 89 mL/min — ABNORMAL LOW (ref >90)
Glucose, Bld: 251 mg/dL — ABNORMAL HIGH (ref 70–99)
POTASSIUM: 4.1 meq/L (ref 3.7–5.3)
Sodium: 134 mEq/L — ABNORMAL LOW (ref 137–147)

## 2013-08-01 LAB — LEGIONELLA ANTIGEN, URINE: LEGIONELLA ANTIGEN, URINE: NEGATIVE

## 2013-08-01 SURGERY — ESOPHAGOGASTRODUODENOSCOPY (EGD) WITH PROPOFOL
Anesthesia: Monitor Anesthesia Care

## 2013-08-01 MED ORDER — LACTATED RINGERS IV SOLN
INTRAVENOUS | Status: DC | PRN
  Administered 2013-08-01: 08:00:00 via INTRAVENOUS

## 2013-08-01 MED ORDER — LEVALBUTEROL HCL 0.63 MG/3ML IN NEBU
0.6300 mg | INHALATION_SOLUTION | RESPIRATORY_TRACT | Status: DC | PRN
  Administered 2013-08-01 – 2013-08-05 (×7): 0.63 mg via RESPIRATORY_TRACT
  Filled 2013-08-01 (×4): qty 3

## 2013-08-01 MED ORDER — IPRATROPIUM BROMIDE 0.02 % IN SOLN
0.5000 mg | Freq: Four times a day (QID) | RESPIRATORY_TRACT | Status: DC
  Administered 2013-08-01 – 2013-08-03 (×9): 0.5 mg via RESPIRATORY_TRACT
  Filled 2013-08-01 (×9): qty 2.5

## 2013-08-01 MED ORDER — HYDROMORPHONE HCL PF 1 MG/ML IJ SOLN
0.2500 mg | INTRAMUSCULAR | Status: DC | PRN
  Administered 2013-08-03 (×2): 0.5 mg via INTRAVENOUS
  Filled 2013-08-01 (×2): qty 1

## 2013-08-01 MED ORDER — LEVALBUTEROL HCL 1.25 MG/0.5ML IN NEBU
0.6300 mg | INHALATION_SOLUTION | Freq: Four times a day (QID) | RESPIRATORY_TRACT | Status: DC
  Administered 2013-08-02: 0.63 mg via RESPIRATORY_TRACT
  Filled 2013-08-01 (×11): qty 0.26

## 2013-08-01 MED ORDER — MORPHINE SULFATE 2 MG/ML IJ SOLN
1.0000 mg | Freq: Once | INTRAMUSCULAR | Status: AC
  Administered 2013-08-01: 1 mg via INTRAVENOUS
  Filled 2013-08-01: qty 1

## 2013-08-01 MED ORDER — SODIUM CHLORIDE 0.9 % IV SOLN: INTRAVENOUS | Status: DC

## 2013-08-01 MED ORDER — METHYLPREDNISOLONE SODIUM SUCC 40 MG IJ SOLR
40.0000 mg | Freq: Two times a day (BID) | INTRAMUSCULAR | Status: DC
  Administered 2013-08-01: 40 mg via INTRAVENOUS
  Filled 2013-08-01 (×4): qty 1

## 2013-08-01 MED ORDER — PROPOFOL INFUSION 10 MG/ML OPTIME
INTRAVENOUS | Status: DC | PRN
  Administered 2013-08-01: 100 ug/kg/min via INTRAVENOUS

## 2013-08-01 MED ORDER — ONDANSETRON HCL 4 MG/2ML IJ SOLN: 4.0000 mg | Freq: Once | INTRAMUSCULAR | Status: AC | PRN

## 2013-08-01 MED ORDER — PROPOFOL 10 MG/ML IV BOLUS
INTRAVENOUS | Status: DC | PRN
  Administered 2013-08-01: 15 mg via INTRAVENOUS

## 2013-08-01 NOTE — Telephone Encounter (Signed)
Message copied by Olena Mater on Fri Aug 01, 2013 11:27 AM ------      Message from: Devoria Glassing      Created: Fri Aug 01, 2013 10:52 AM       Emma Douglas is calling for this patient regarding her seeing a counselor please call her back at [phone] ------

## 2013-08-01 NOTE — Anesthesia Postprocedure Evaluation (Signed)
  Anesthesia Post-op Note  Patient: Emma Douglas  Procedure(s) Performed: Procedure(s): ESOPHAGOGASTRODUODENOSCOPY (EGD) WITH PROPOFOL (N/A)  Patient Location: PACU  Anesthesia Type:MAC  Level of Consciousness: awake, alert , oriented and patient cooperative  Airway and Oxygen Therapy: Patient Spontanous Breathing  Post-op Pain: none  Post-op Assessment: Post-op Vital signs reviewed, Patient's Cardiovascular Status Stable, Respiratory Function Stable, Patent Airway, No signs of Nausea or vomiting and Pain level controlled  Post-op Vital Signs: stable  Last Vitals:  Filed Vitals:   08/01/13 0930  BP: 139/50  Pulse: 80  Temp:   Resp: 24    Complications: No apparent anesthesia complications

## 2013-08-01 NOTE — Progress Notes (Signed)
[facility] PROGRESS NOTE Assessment/Plan: Acute respiratory failure due Post obstructive CAP (community acquired pneumonia): - Started on Rocephin and azithromycin on 5.28. - has remained afebrile, still with leukocytosis ( ? If leukocytosis due to steroids and/or gastric and lung mass.) - Will probably need radiation to lung mass  Lung mass/gastric mass/  Hilar adenopathy being evaluated by Dr. Earlie Server: - consult GI: EGD showed no mass. - consult pulmonary for EUS and Biopsy.  Atrial flutter with rapid ventricular response - Started on diltiazem gtt, transition to oral. - now rate controlled. Not anticoagulation.  COPD with acute exacerbation - IV solumedrol and IV steroids.  Benign hypertensive heart disease without heart failure - stable   Hyponatremia: - Urine osmolarity, urine Sodium < 20, FENA < 1%. - Cont IV fluids, b-met  Code status: DNR/DNI Family Communication: daughter  Disposition Plan: inpatinet   Consultants:  GI  Procedures:  EGD in am 5.29.2015  Antibiotics:  Rocephin and azithro  HPI/Subjective: No complains  Objective: Filed Vitals:   08/01/13 0915 08/01/13 0920 08/01/13 0923 08/01/13 0930  BP: 138/63 131/58 143/55 139/50  Pulse: 85 75 78 80  Temp:      TempSrc:      Resp: '31 24 24 24  ' Height:      Weight:      SpO2: 87% 96% 95% 89%    Intake/Output Summary (Last 24 hours) at 08/01/13 1105 Last data filed at 08/01/13 0841  Gross per 24 hour  Intake    440 ml  Output   1575 ml  Net  -1135 ml   Filed Weights   07/30/13 2057 07/31/13 0347 08/01/13 0442  Weight: 47.401 kg (104 lb 8 oz) 47.9 kg (105 lb 9.6 oz) 47.5 kg (104 lb 11.5 oz)    Exam:  General: Alert, awake, oriented x3, in no acute distress. cachectic HEENT: No bruits, no goiter.  Heart: Regular rate and rhythm, without murmurs, rubs, gallops.  Lungs: Good air movement, clear Abdomen: Soft, nontender, nondistended, positive bowel sounds.    Data  Reviewed: Basic Metabolic Panel:  Recent Labs Lab 07/29/13 1111 07/30/13 1642 07/30/13 2200 07/31/13 0401  NA 134* 135*  --  128*  K 3.8 4.6  --  3.7  CL  --  94*  --  95*  CO2 25 25  --  20  GLUCOSE 118 108*  --  434*  BUN 12.0 16  --  11  CREATININE 0.8 0.84 0.61 0.56  CALCIUM 10.3 11.2*  --  7.9*  MG  --  1.6  --   --    Liver Function Tests:  Recent Labs Lab 07/29/13 1111  AST 22  ALT 10  ALKPHOS 102  BILITOT 0.62  PROT 7.1  ALBUMIN 2.7*   No results found for this basename: LIPASE, AMYLASE,  in the last 168 hours No results found for this basename: AMMONIA,  in the last 168 hours CBC:  Recent Labs Lab 07/29/13 1111 07/30/13 1642 07/30/13 2200 07/31/13 0401  WBC 12.6* 14.1* 12.7* 12.6*  NEUTROABS 10.1* 11.3*  --  11.8*  HGB 13.2 14.4 12.1 12.0  HCT 39.4 41.3 36.5 35.8*  MCV 98.7 97.9 98.9 97.8  PLT 419* 422* 384 376   Cardiac Enzymes: No results found for this basename: CKTOTAL, CKMB, CKMBINDEX, TROPONINI,  in the last 168 hours BNP (last 3 results)  Recent Labs  07/30/13 1644  PROBNP 2688.0*   CBG:  Recent Labs Lab 07/30/13 2141  GLUCAP 128*  Recent Results (from the past 240 hour(s))  CULTURE, BLOOD (ROUTINE X 2)     Status: None   Collection Time    07/30/13  7:24 PM      Result Value Ref Range Status   Specimen Description BLOOD RIGHT FOREARM   Final   Special Requests BOTTLES DRAWN AEROBIC ONLY 5CC   Final   Culture  Setup Time     Final   Value: 07/31/2013 00:43     Performed at [facility]   Culture     Final   Value:        BLOOD CULTURE RECEIVED NO GROWTH TO DATE CULTURE WILL BE HELD FOR 5 DAYS BEFORE ISSUING A FINAL NEGATIVE REPORT     Performed at [facility]   Report Status PENDING   Incomplete  CULTURE, BLOOD (ROUTINE X 2)     Status: None   Collection Time    07/30/13  7:34 PM      Result Value Ref Range Status   Specimen Description BLOOD RIGHT FOREARM   Final   Special Requests BOTTLES DRAWN  AEROBIC AND ANAEROBIC 5CC   Final   Culture  Setup Time     Final   Value: 07/31/2013 00:43     Performed at [facility]   Culture     Final   Value:        BLOOD CULTURE RECEIVED NO GROWTH TO DATE CULTURE WILL BE HELD FOR 5 DAYS BEFORE ISSUING A FINAL NEGATIVE REPORT     Performed at [facility]   Report Status PENDING   Incomplete  MRSA PCR SCREENING     Status: None   Collection Time    07/30/13  9:59 PM      Result Value Ref Range Status   MRSA by PCR NEGATIVE  NEGATIVE Final   Comment:            The GeneXpert MRSA Assay (FDA     approved for NASAL specimens     only), is one component of a     comprehensive MRSA colonization     surveillance program. It is not     intended to diagnose MRSA     infection nor to guide or     monitor treatment for     MRSA infections.  CULTURE, BLOOD (ROUTINE X 2)     Status: None   Collection Time    07/30/13 10:00 PM      Result Value Ref Range Status   Specimen Description BLOOD RIGHT HAND   Final   Special Requests BOTTLES DRAWN AEROBIC ONLY 5CC   Final   Culture  Setup Time     Final   Value: 07/31/2013 03:54     Performed at [facility]   Culture     Final   Value:        BLOOD CULTURE RECEIVED NO GROWTH TO DATE CULTURE WILL BE HELD FOR 5 DAYS BEFORE ISSUING A FINAL NEGATIVE REPORT     Performed at [facility]   Report Status PENDING   Incomplete  CULTURE, BLOOD (ROUTINE X 2)     Status: None   Collection Time    07/30/13 10:09 PM      Result Value Ref Range Status   Specimen Description BLOOD LEFT HAND   Final   Special Requests BOTTLES DRAWN AEROBIC ONLY 1.5CC   Final   Culture  Setup Time  Final   Value: 07/31/2013 03:54     Performed at [facility]   Culture     Final   Value:        BLOOD CULTURE RECEIVED NO GROWTH TO DATE CULTURE WILL BE HELD FOR 5 DAYS BEFORE ISSUING A FINAL NEGATIVE REPORT     Performed at [facility]   Report Status PENDING   Incomplete    CULTURE, EXPECTORATED SPUTUM-ASSESSMENT     Status: None   Collection Time    07/31/13  9:06 AM      Result Value Ref Range Status   Specimen Description SPUTUM   Final   Special Requests NONE   Final   Sputum evaluation     Final   Value: MICROSCOPIC FINDINGS SUGGEST THAT THIS SPECIMEN IS NOT REPRESENTATIVE OF LOWER RESPIRATORY SECRETIONS. PLEASE RECOLLECT.     Gram Stain Report Called to,Read Back By and Verified With: Darryll Capers RN 11:45 07/31/13 ([email or web address])   Report Status 07/31/2013 FINAL   Final     Studies: Ct Angio Chest Pe W/cm &/or Wo Cm  07/30/2013   CLINICAL DATA:  Shortness of breath and cough.  Atrial flutter.  EXAM: CT ANGIOGRAPHY CHEST WITH CONTRAST  TECHNIQUE: Multidetector CT imaging of the chest was performed using the standard protocol during bolus administration of intravenous contrast. Multiplanar CT image reconstructions and MIPs were obtained to evaluate the vascular anatomy.  CONTRAST:  171m OMNIPAQUE IOHEXOL 350 MG/ML SOLN  COMPARISON:  Chest x-ray 07/30/2013 CT scan dated 07/11/2013  FINDINGS: There are no definitive pulmonary emboli. There has been marked progression of mediastinal tumor as well as the hilar tumor masses as well as the tumor in the lingula. There is extensive spread of tumor into the left lower lobe. Tumor encases and occludes peripheral arteries in the left lower lobe. There is now occlusion of the right middle lobe bronchus and of the left lower lobe bronchus with almost complete occlusion of the left upper lobe bronchus and with marked narrowing of the right lower lobe bronchus.  There is new hazy infiltrate in the inferior aspect of the right upper lobe. There is new loculated pleural fluid posterior to the left upper lobe.  The tumor compresses the pulmonary arteries bilaterally. The tumor extends posterior to the left atrium and has a mass effect upon the left atrium.  No acute osseous abnormalities.  Review of the MIP images confirms the above  findings.  IMPRESSION: 1. Marked progression of mediastinal and hilar tumor with with new extensive tumor extension into the left lower lobe and enlargement of the mass in the lingula. The small irregular mass in the right upper lobe is unchanged.  2. New obstruction of the left lower lobe bronchus, near obstruction of the left upper lobe bronchus, complete obstruction of the right middle lobe bronchus, and severe narrowing of the right lower lobe bronchus.   Electronically Signed   By: JRozetta NunneryM.D.   On: 07/30/2013 20:46   Dg Chest Port 1 View  07/30/2013   CLINICAL DATA:  Cough, wheezing  EXAM: PORTABLE CHEST - 1 VIEW  COMPARISON:  07/11/2013 and earlier studies  FINDINGS: Bilateral hilar adenopathy. Progressive airspace consolidation in the basilar segments left lower lobe. New interstitial opacities around the left hilum and in the right infrahilar region. Probable left pleural effusion. . Atheromatous aorta.  Heart size upper limits normal. Degenerative changes in bilateral shoulders.  IMPRESSION: 1. Progressive consolidation/atelectasis in the left lower lung, with probable small  effusion. 2. Persistent bilateral hilar adenopathy.   Electronically Signed   By: Arne Cleveland M.D.   On: 07/30/2013 18:43    Scheduled Meds: . atorvastatin  20 mg Oral QHS  . azithromycin  500 mg Intravenous Q24H  . cefTRIAXone (ROCEPHIN)  IV  1 g Intravenous Q24H  . diltiazem  30 mg Oral 3 times per day  . feeding supplement (ENSURE COMPLETE)  237 mL Oral BID BM  . levothyroxine  100 mcg Oral QAC breakfast  . lisinopril  10 mg Oral Daily  . methylPREDNISolone (SOLU-MEDROL) injection  80 mg Intravenous Q12H  . nicotine  14 mg Transdermal Daily  . sodium chloride  3 mL Intravenous Q12H   Continuous Infusions:    [facility] Pager [phone]. If 8PM-8AM, please contact night-coverage at [email or web address], password [facility] 08/01/2013, 11:05 AM  LOS: 2 days

## 2013-08-01 NOTE — Transfer of Care (Signed)
Immediate Anesthesia Transfer of Care Note  Patient: Emma Douglas  Procedure(s) Performed: Procedure(s): ESOPHAGOGASTRODUODENOSCOPY (EGD) WITH PROPOFOL (N/A)  Patient Location: PACU  Anesthesia Type:MAC  Level of Consciousness: awake, alert  and oriented  Airway & Oxygen Therapy: Patient Spontanous Breathing  Post-op Assessment: Report given to PACU RN  Post vital signs: stable  Complications: No apparent anesthesia complications

## 2013-08-01 NOTE — Telephone Encounter (Signed)
lvm for pt regarding to June appt.Marland KitchenMarland KitchenMarland KitchenMary at [facility] would not sched appt due to primary on Medicaid card needs to referr....lvm with pt with all info

## 2013-08-01 NOTE — Anesthesia Preprocedure Evaluation (Addendum)
Anesthesia Evaluation  Patient identified by MRN, date of birth, ID band Patient awake    Reviewed: Allergy & Precautions, H&P , NPO status , Patient's Chart, lab work & pertinent test results  Airway       Dental   Pulmonary COPDCurrent Smoker,          Cardiovascular hypertension, + dysrhythmias     Neuro/Psych PSYCHIATRIC DISORDERS    GI/Hepatic   Endo/Other  Hypothyroidism   Renal/GU Renal disease     Musculoskeletal   Abdominal   Peds  Hematology  (+) Blood dyscrasia, anemia ,   Anesthesia Other Findings   Reproductive/Obstetrics                          Anesthesia Physical Anesthesia Plan  ASA: III  Anesthesia Plan: MAC   Post-op Pain Management:    Induction: Intravenous  Airway Management Planned: Mask  Additional Equipment:   Intra-op Plan:   Post-operative Plan:   Informed Consent: I have reviewed the patients History and Physical, chart, labs and discussed the procedure including the risks, benefits and alternatives for the proposed anesthesia with the patient or authorized representative who has indicated his/her understanding and acceptance.     Plan Discussed with:   Anesthesia Plan Comments:         Anesthesia Quick Evaluation

## 2013-08-01 NOTE — Telephone Encounter (Signed)
Daughter states someone left message about her Mother needing a referral for a specialist.  They left message on her phone.  She could not quite understand.  Is going to come by office and let me listen to message to help her.  Mother currently still inpatient at [facility]

## 2013-08-01 NOTE — Consult Note (Addendum)
PULMONARY / CRITICAL CARE MEDICINE   Name: Emma Douglas MRN: [id number] DOB: 1940-09-18    ADMISSION DATE:  07/30/2013 CONSULTATION DATE:  5/29  REFERRING MD :  [facility] PRIMARY SERVICE: [facility]  CHIEF COMPLAINT:  Rapid heart rate   HISTORY OF PRESENT ILLNESS:   73 yo life long smoker who was noted to have 40 lbs weight loss, productive cough,and seen by PCP with Cxr. Following CT scan revealed Mediastinal adenopathy measures up to 2.5 x 3.3 cm in the lower right paratracheal station. Subcarinal adenopathy measures 3.0 x 6.2 cm. Bi hilar adenopathy measures up to 3.6 x 4.3 cm on the left. Sh was evaluated by Dr. Blima Rich of oncology 07/28/13 and was scheduled for PET   She presents to [facility] hospital with CC: of rapid heart rate. CT scan is negative for PE but shows increased mass and adenopathy of chest. She had Upper endoscopic exam 5/29 per GI services for bx of gastric mass. EGD showed no mass. PCCM asked to evaluate. PMH/o htn, copd, subdural hematoma from fall in 2013   PAST MEDICAL HISTORY :  Past Medical History  Diagnosis Date  . Compensated hypothyroidism   . Essential hypertension   . Dyslipidemia   . Colon polyps   . Anxiety   . COPD (chronic obstructive pulmonary disease)     secondary to smoking.  . Avascular necrosis     right femoral head  . Hyperlipidemia   . Anemia      Postoperative hemorrhagic anemia  . Allergic reaction 10/24/2012  . Arthritis     "hands" (07/31/2013)  . Lung mass     /CT 07/11/2013  . Mass of stomach     /CT 07/11/2013  . CAP (community acquired pneumonia) 07/30/2013    "first time I've ever had pneumonia"  . Atrial flutter with rapid ventricular response     Archie Endo 07/31/2013  . Subdural hemorrhage following injury 2013    fall/notes 07/30/2013   Past Surgical History  Procedure Laterality Date  . Colonoscopy      with polypectomy and biopsy  . Total hip arthroplasty Right 2002  . Dilation and curettage of uterus    . Tubal ligation      Prior to Admission medications   Medication Sig Start Date End Date Taking? Authorizing Provider  EPINEPHrine (EPI-PEN) 0.3 mg/0.3 mL SOAJ injection Inject 0.3 mLs (0.3 mg total) into the muscle once. 10/24/12  Yes Mary B Dixon, PA-C  ibuprofen (ADVIL,MOTRIN) 100 MG chewable tablet Chew 200 mg by mouth every 8 (eight) hours as needed for mild pain.    Yes Historical Provider, MD  levothyroxine (SYNTHROID, LEVOTHROID) 100 MCG tablet Take 100 mcg by mouth daily before breakfast.   Yes Historical Provider, MD  lisinopril (PRINIVIL,ZESTRIL) 10 MG tablet Take 1 tablet (10 mg total) by mouth daily. 07/09/13  Yes Orlena Sheldon, PA-C  Multiple Vitamin (MULTIVITAMIN) tablet Take 1 tablet by mouth daily.   Yes Historical Provider, MD  simvastatin (ZOCOR) 40 MG tablet Take 40 mg by mouth at bedtime.   Yes Historical Provider, MD  traMADol (ULTRAM) 50 MG tablet Take 1 tablet (50 mg total) by mouth every 6 (six) hours as needed. 07/29/13  Yes Curt Bears, MD   Allergies  Allergen Reactions  . Black Pepper [Piper] Anaphylaxis  . Tomato Anaphylaxis    Has epi pen    FAMILY HISTORY:  Family History  Problem Relation Age of Onset  . Heart attack Mother   . Heart attack  Father    SOCIAL HISTORY:  reports that she has been smoking Cigarettes.  She has a 9.5 pack-year smoking history. She has never used smokeless tobacco. She reports that she drinks alcohol. She reports that she does not use illicit drugs.  Smokes 1/2 pPD x 50 yrs , about 25 pyrs + cigars  REVIEW OF SYSTEMS: 10 point review of system taken, please see HPI for positives and negatives. 40 lb wt loss over past year White sputum, no hemoptysis DOe after walking 30 yards  SUBJECTIVE:   VITAL SIGNS: Temp:  [98.1 F (36.7 C)-98.9 F (37.2 C)] 98.4 F (36.9 C) (05/29 0746) Pulse Rate:  [73-91] 80 (05/29 0930) Resp:  [18-95] 24 (05/29 0930) BP: (102-143)/(42-71) 139/50 mmHg (05/29 0930) SpO2:  [87 %-100 %] 89 % (05/29 0930) FiO2  (%):  [98 %] 98 % (05/29 0910) Weight:  [47.5 kg (104 lb 11.5 oz)] 47.5 kg (104 lb 11.5 oz) (05/29 0442) HEMODYNAMICS:   VENTILATOR SETTINGS: Vent Mode:  [-]  FiO2 (%):  [98 %] 98 % INTAKE / OUTPUT: Intake/Output     05/28 0701 - 05/29 0700 05/29 0701 - 05/30 0700   P.O. 720    I.V. (mL/kg)  200 (4.2)   Total Intake(mL/kg) 720 (15.2) 200 (4.2)   Urine (mL/kg/hr) 575 (0.5) 1125 (5.1)   Total Output 575 1125   Net +145 -925          PHYSICAL EXAMINATION: General:  Frail, thin AAF with bizarre affect  Neuro:  MAE x4, no overt deficits  HEENT:  No LAN/JVD, poor dentation Cardiovascular:  hsr rrr Lungs:  Coarse rhonchi bilat, purulent gray secretions  Abdomen:  Thin + bs Musculoskeletal:  Intatc Skin:  warm  LABS:  CBC  Recent Labs Lab 07/30/13 1642 07/30/13 2200 07/31/13 0401  WBC 14.1* 12.7* 12.6*  HGB 14.4 12.1 12.0  HCT 41.3 36.5 35.8*  PLT 422* 384 376   Coag's  Recent Labs Lab 07/30/13 1642  INR 0.96   BMET  Recent Labs Lab 07/29/13 1111 07/30/13 1642 07/30/13 2200 07/31/13 0401  NA 134* 135*  --  128*  K 3.8 4.6  --  3.7  CL  --  94*  --  95*  CO2 25 25  --  20  BUN 12.0 16  --  11  CREATININE 0.8 0.84 0.61 0.56  GLUCOSE 118 108*  --  434*   Electrolytes  Recent Labs Lab 07/29/13 1111 07/30/13 1642 07/31/13 0401  CALCIUM 10.3 11.2* 7.9*  MG  --  1.6  --    Sepsis Markers  Recent Labs Lab 07/30/13 1951  LATICACIDVEN 1.77   ABG No results found for this basename: PHART, PCO2ART, PO2ART,  in the last 168 hours Liver Enzymes  Recent Labs Lab 07/29/13 1111  AST 22  ALT 10  ALKPHOS 102  BILITOT 0.62  ALBUMIN 2.7*   Cardiac Enzymes  Recent Labs Lab 07/30/13 1644  PROBNP 2688.0*   Glucose  Recent Labs Lab 07/30/13 2141  GLUCAP 128*    Imaging Ct Angio Chest Pe W/cm &/or Wo Cm  07/30/2013   CLINICAL DATA:  Shortness of breath and cough.  Atrial flutter.  EXAM: CT ANGIOGRAPHY CHEST WITH CONTRAST  TECHNIQUE:  Multidetector CT imaging of the chest was performed using the standard protocol during bolus administration of intravenous contrast. Multiplanar CT image reconstructions and MIPs were obtained to evaluate the vascular anatomy.  CONTRAST:  171mL OMNIPAQUE IOHEXOL 350 MG/ML SOLN  COMPARISON:  Chest  x-ray 07/30/2013 CT scan dated 07/11/2013  FINDINGS: There are no definitive pulmonary emboli. There has been marked progression of mediastinal tumor as well as the hilar tumor masses as well as the tumor in the lingula. There is extensive spread of tumor into the left lower lobe. Tumor encases and occludes peripheral arteries in the left lower lobe. There is now occlusion of the right middle lobe bronchus and of the left lower lobe bronchus with almost complete occlusion of the left upper lobe bronchus and with marked narrowing of the right lower lobe bronchus.  There is new hazy infiltrate in the inferior aspect of the right upper lobe. There is new loculated pleural fluid posterior to the left upper lobe.  The tumor compresses the pulmonary arteries bilaterally. The tumor extends posterior to the left atrium and has a mass effect upon the left atrium.  No acute osseous abnormalities.  Review of the MIP images confirms the above findings.  IMPRESSION: 1. Marked progression of mediastinal and hilar tumor with with new extensive tumor extension into the left lower lobe and enlargement of the mass in the lingula. The small irregular mass in the right upper lobe is unchanged.  2. New obstruction of the left lower lobe bronchus, near obstruction of the left upper lobe bronchus, complete obstruction of the right middle lobe bronchus, and severe narrowing of the right lower lobe bronchus.   Electronically Signed   By: Rozetta Nunnery M.D.   On: 07/30/2013 20:46   Dg Chest Port 1 View  07/30/2013   CLINICAL DATA:  Cough, wheezing  EXAM: PORTABLE CHEST - 1 VIEW  COMPARISON:  07/11/2013 and earlier studies  FINDINGS: Bilateral  hilar adenopathy. Progressive airspace consolidation in the basilar segments left lower lobe. New interstitial opacities around the left hilum and in the right infrahilar region. Probable left pleural effusion. . Atheromatous aorta.  Heart size upper limits normal. Degenerative changes in bilateral shoulders.  IMPRESSION: 1. Progressive consolidation/atelectasis in the left lower lung, with probable small effusion. 2. Persistent bilateral hilar adenopathy.   Electronically Signed   By: Arne Cleveland M.D.   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 Plan for bronchoscopy on Monday 7-30 am, consent, npo after mN night before, The various options of biopsy including bronchoscopy, CT guided needle aspiration and surgical biopsy were discussed.The risks of each procedure including coughing, bleeding and the  chances of lung puncture requiring chest tube were discussed in great detail. The benefits & alternatives including serial follow up were also discussed. Can also perform TBNA subcarinal to stage at the same time PET scan as outpt scheduled for 6/5 2. Agree with abx/BD will add Atrovent neb., drop solumedrol 40 q 12h since no bspasm & taper in 48h 3. Smoking cessation ! 4. Ct antibiotics for post obs pna  DNR noted   Care during the described time interval was provided by me and/or other providers on the critical care team.  I have reviewed this patient's available data, including medical history, events of note, physical examination and test results as  part of my evaluation  Kara Mead MD. Shade Flood. [facility] Pager [phone] If no response call [phone]   08/01/2013, 2:59 PM

## 2013-08-01 NOTE — Progress Notes (Signed)
Emma Douglas 8:23 AM  Subjective: Patient seen and examined and discussed with my partner and her computer chart was reviewed and she only has some mild abdominal discomfort but to coughing  Objective: Vital signs stable afebrile exam please see pre-assessment evaluation no new labs today lung exam highly abnormal  Assessment: Metastatic cancer with abnormal stomach lesion on CT Plan: Okay to proceed with endoscopy with anesthesia assistance today  Jeryl Columbia

## 2013-08-01 NOTE — Op Note (Signed)
[facility] [place], [id number]   ENDOSCOPY PROCEDURE REPORT  PATIENT: Emma Douglas, Emma Douglas  MR#: [id number] BIRTHDATE: 10-Apr-1940 , 73  yrs. old GENDER: Female  ENDOSCOPIST: Clarene Essex, MD REFERRED IE:PPIRJJO Julien Nordmann, M.D.  PROCEDURE DATE:  08/01/2013 PROCEDURE:   EGD, diagnostic ASA CLASS:   Class IV INDICATIONS:abnormal CT of the GI tract.  MEDICATIONS: propofol (Diprivan) 50mg  IV  TOPICAL ANESTHETIC:none  DESCRIPTION OF PROCEDURE:   After the risks benefits and alternatives of the procedure were thoroughly explained, informed consent was obtained.  The Pentax Gastroscope [id number]  endoscope was introduced through the mouth and advanced to the second portion of the duodenum , limited by Without limitations.   The instrument was slowly withdrawn as the mucosa was fully examined.the findings are recorded below but no gastric mass was seen and the patient tolerated the procedure well there was no obvious immediate complication         FINDINGS:1. Small hiatal hernia with mild distal esophagitis 2. Minimal gastritis 3. Otherwise within normal limits EGD without any signs of stomach mass  COMPLICATIONS:none  ENDOSCOPIC IMPRESSION:above   RECOMMENDATIONS:would recommend pump inhibitors and would be happy to see back when necessary and further workup and plans per oncology team   REPEAT EXAM: as needed   _______________________________ Clarene Essex, MD eSigned:  Clarene Essex, MD 08/01/2013 8:48 AM    AC:[id number] Julien Nordmann, MD  PATIENT NAME:  Adrianne, Shackleton MR#: [id number]

## 2013-08-02 LAB — BASIC METABOLIC PANEL
BUN: 17 mg/dL (ref 6–23)
CALCIUM: 9.9 mg/dL (ref 8.4–10.5)
CO2: 26 mEq/L (ref 19–32)
CREATININE: 0.59 mg/dL (ref 0.50–1.10)
Chloride: 98 mEq/L (ref 96–112)
GFR calc non Af Amer: 88 mL/min — ABNORMAL LOW (ref >90)
Glucose, Bld: 137 mg/dL — ABNORMAL HIGH (ref 70–99)
Potassium: 4.4 mEq/L (ref 3.7–5.3)
Sodium: 137 mEq/L (ref 137–147)

## 2013-08-02 MED ORDER — DILTIAZEM HCL 60 MG PO TABS
60.0000 mg | ORAL_TABLET | Freq: Three times a day (TID) | ORAL | Status: DC
  Administered 2013-08-02 – 2013-08-06 (×13): 60 mg via ORAL
  Filled 2013-08-02 (×15): qty 1

## 2013-08-02 MED ORDER — PREDNISONE 20 MG PO TABS
20.0000 mg | ORAL_TABLET | Freq: Every day | ORAL | Status: DC
  Filled 2013-08-02: qty 1

## 2013-08-02 MED ORDER — PREDNISONE 20 MG PO TABS
20.0000 mg | ORAL_TABLET | Freq: Every day | ORAL | Status: DC
  Administered 2013-08-02 – 2013-08-05 (×4): 20 mg via ORAL
  Filled 2013-08-02 (×6): qty 1

## 2013-08-02 MED ORDER — AZITHROMYCIN 500 MG PO TABS
500.0000 mg | ORAL_TABLET | ORAL | Status: DC
  Administered 2013-08-02 – 2013-08-04 (×3): 500 mg via ORAL
  Filled 2013-08-02 (×6): qty 1

## 2013-08-02 NOTE — Progress Notes (Signed)
[facility] PROGRESS NOTE Assessment/Plan: Acute respiratory failure due Post obstructive CAP (community acquired pneumonia): - Started on Rocephin and azithromycin on 5.28. - has remained afebrile, still with leukocytosis ( ? If leukocytosis due to steroids and/or gastric and lung mass.) - Will probably need radiation to lung mass.  Lung mass/gastric mass/  Hilar adenopathy being evaluated by Dr. Earlie Server: - consult GI: EGD showed no mass. - Pulmonary rec: for EUS and Biopsy on 6.1.2015.  Atrial flutter with rapid ventricular response - Started on diltiazem gtt, transition to oral. No back to Sinues tach, ekg pending. - Not anticoagulation.  COPD with acute exacerbation - Decrease dose and change to oral steroids. - con inhalers.  Benign hypertensive heart disease without heart failure - stable   Hyponatremia: - Urine osmolarity, urine Sodium < 20, FENA < 1%. - Cont IV fluids, b-met  Code status: DNR/DNI Family Communication: daughter  Disposition Plan: inpatinet   Consultants:  GI  Procedures:  EGD in am 5.29.2015  Antibiotics:  Rocephin and azithro  HPI/Subjective: No complains  Objective: Filed Vitals:   08/02/13 0157 08/02/13 0457 08/02/13 0700 08/02/13 0925  BP:  146/83  121/68  Pulse:  76  107  Temp:  98.3 F (36.8 C)  98.6 F (37 C)  TempSrc:  Oral  Oral  Resp:  22  20  Height:      Weight:  47.537 kg (104 lb 12.8 oz)    SpO2: 94% 95% 94% 93%    Intake/Output Summary (Last 24 hours) at 08/02/13 1007 Last data filed at 08/02/13 0923  Gross per 24 hour  Intake    640 ml  Output    300 ml  Net    340 ml   Filed Weights   07/31/13 0347 08/01/13 0442 08/02/13 0457  Weight: 47.9 kg (105 lb 9.6 oz) 47.5 kg (104 lb 11.5 oz) 47.537 kg (104 lb 12.8 oz)    Exam:  General: Alert, awake, oriented x3, in no acute distress. cachectic HEENT: No bruits, no goiter.  Heart: Regular rate and rhythm. Lungs: Good air movement,  clear Abdomen: Soft, nontender, nondistended, positive bowel sounds.    Data Reviewed: Basic Metabolic Panel:  Recent Labs Lab 07/29/13 1111 07/30/13 1642 07/30/13 2200 07/31/13 0401 08/01/13 1235 08/02/13 0303  NA 134* 135*  --  128* 134* 137  K 3.8 4.6  --  3.7 4.1 4.4  CL  --  94*  --  95* 96 98  CO2 25 25  --  '20 25 26  ' GLUCOSE 118 108*  --  434* 251* 137*  BUN 12.0 16  --  '11 15 17  ' CREATININE 0.8 0.84 0.61 0.56 0.58 0.59  CALCIUM 10.3 11.2*  --  7.9* 9.9 9.9  MG  --  1.6  --   --   --   --    Liver Function Tests:  Recent Labs Lab 07/29/13 1111  AST 22  ALT 10  ALKPHOS 102  BILITOT 0.62  PROT 7.1  ALBUMIN 2.7*   No results found for this basename: LIPASE, AMYLASE,  in the last 168 hours No results found for this basename: AMMONIA,  in the last 168 hours CBC:  Recent Labs Lab 07/29/13 1111 07/30/13 1642 07/30/13 2200 07/31/13 0401  WBC 12.6* 14.1* 12.7* 12.6*  NEUTROABS 10.1* 11.3*  --  11.8*  HGB 13.2 14.4 12.1 12.0  HCT 39.4 41.3 36.5 35.8*  MCV 98.7 97.9 98.9 97.8  PLT 419* 422* 384 376  Cardiac Enzymes: No results found for this basename: CKTOTAL, CKMB, CKMBINDEX, TROPONINI,  in the last 168 hours BNP (last 3 results)  Recent Labs  07/30/13 1644  PROBNP 2688.0*   CBG:  Recent Labs Lab 07/30/13 2141  GLUCAP 128*    Recent Results (from the past 240 hour(s))  CULTURE, BLOOD (ROUTINE X 2)     Status: None   Collection Time    07/30/13  7:24 PM      Result Value Ref Range Status   Specimen Description BLOOD RIGHT FOREARM   Final   Special Requests BOTTLES DRAWN AEROBIC ONLY 5CC   Final   Culture  Setup Time     Final   Value: 07/31/2013 00:43     Performed at [facility]   Culture     Final   Value:        BLOOD CULTURE RECEIVED NO GROWTH TO DATE CULTURE WILL BE HELD FOR 5 DAYS BEFORE ISSUING A FINAL NEGATIVE REPORT     Performed at [facility]   Report Status PENDING   Incomplete  CULTURE, BLOOD (ROUTINE X  2)     Status: None   Collection Time    07/30/13  7:34 PM      Result Value Ref Range Status   Specimen Description BLOOD RIGHT FOREARM   Final   Special Requests BOTTLES DRAWN AEROBIC AND ANAEROBIC 5CC   Final   Culture  Setup Time     Final   Value: 07/31/2013 00:43     Performed at [facility]   Culture     Final   Value:        BLOOD CULTURE RECEIVED NO GROWTH TO DATE CULTURE WILL BE HELD FOR 5 DAYS BEFORE ISSUING A FINAL NEGATIVE REPORT     Performed at [facility]   Report Status PENDING   Incomplete  MRSA PCR SCREENING     Status: None   Collection Time    07/30/13  9:59 PM      Result Value Ref Range Status   MRSA by PCR NEGATIVE  NEGATIVE Final   Comment:            The GeneXpert MRSA Assay (FDA     approved for NASAL specimens     only), is one component of a     comprehensive MRSA colonization     surveillance program. It is not     intended to diagnose MRSA     infection nor to guide or     monitor treatment for     MRSA infections.  CULTURE, BLOOD (ROUTINE X 2)     Status: None   Collection Time    07/30/13 10:00 PM      Result Value Ref Range Status   Specimen Description BLOOD RIGHT HAND   Final   Special Requests BOTTLES DRAWN AEROBIC ONLY 5CC   Final   Culture  Setup Time     Final   Value: 07/31/2013 03:54     Performed at [facility]   Culture     Final   Value:        BLOOD CULTURE RECEIVED NO GROWTH TO DATE CULTURE WILL BE HELD FOR 5 DAYS BEFORE ISSUING A FINAL NEGATIVE REPORT     Performed at [facility]   Report Status PENDING   Incomplete  CULTURE, BLOOD (ROUTINE X 2)     Status: None   Collection Time  07/30/13 10:09 PM      Result Value Ref Range Status   Specimen Description BLOOD LEFT HAND   Final   Special Requests BOTTLES DRAWN AEROBIC ONLY 1.5CC   Final   Culture  Setup Time     Final   Value: 07/31/2013 03:54     Performed at [facility]   Culture     Final   Value:        BLOOD CULTURE  RECEIVED NO GROWTH TO DATE CULTURE WILL BE HELD FOR 5 DAYS BEFORE ISSUING A FINAL NEGATIVE REPORT     Performed at [facility]   Report Status PENDING   Incomplete  CULTURE, EXPECTORATED SPUTUM-ASSESSMENT     Status: None   Collection Time    07/31/13  9:06 AM      Result Value Ref Range Status   Specimen Description SPUTUM   Final   Special Requests NONE   Final   Sputum evaluation     Final   Value: MICROSCOPIC FINDINGS SUGGEST THAT THIS SPECIMEN IS NOT REPRESENTATIVE OF LOWER RESPIRATORY SECRETIONS. PLEASE RECOLLECT.     Gram Stain Report Called to,Read Back By and Verified With: Darryll Capers RN 11:45 07/31/13 ([email or web address])   Report Status 07/31/2013 FINAL   Final     Studies: No results found.  Scheduled Meds: . atorvastatin  20 mg Oral QHS  . azithromycin  500 mg Intravenous Q24H  . cefTRIAXone (ROCEPHIN)  IV  1 g Intravenous Q24H  . diltiazem  60 mg Oral 3 times per day  . feeding supplement (ENSURE COMPLETE)  237 mL Oral BID BM  . ipratropium  0.5 mg Nebulization Q6H  . levalbuterol  0.63 mg Nebulization 4 times per day  . levothyroxine  100 mcg Oral QAC breakfast  . lisinopril  10 mg Oral Daily  . methylPREDNISolone (SOLU-MEDROL) injection  40 mg Intravenous Q12H  . nicotine  14 mg Transdermal Daily  . sodium chloride  3 mL Intravenous Q12H   Continuous Infusions:    [facility] Pager [phone]. If 8PM-8AM, please contact night-coverage at [email or web address], password [facility] 08/02/2013, 10:07 AM  LOS: 3 days

## 2013-08-03 DIAGNOSIS — G9340 Encephalopathy, unspecified: Secondary | ICD-10-CM

## 2013-08-03 LAB — BASIC METABOLIC PANEL
BUN: 19 mg/dL (ref 6–23)
CALCIUM: 9.9 mg/dL (ref 8.4–10.5)
CO2: 26 mEq/L (ref 19–32)
CREATININE: 0.56 mg/dL (ref 0.50–1.10)
Chloride: 95 mEq/L — ABNORMAL LOW (ref 96–112)
GFR calc Af Amer: >90 mL/min (ref >90)
GFR, EST NON AFRICAN AMERICAN: 90 mL/min — AB (ref >90)
Glucose, Bld: 105 mg/dL — ABNORMAL HIGH (ref 70–99)
Potassium: 4.2 mEq/L (ref 3.7–5.3)
SODIUM: 134 meq/L — AB (ref 137–147)

## 2013-08-03 MED ORDER — LEVALBUTEROL HCL 0.63 MG/3ML IN NEBU
0.6300 mg | INHALATION_SOLUTION | Freq: Four times a day (QID) | RESPIRATORY_TRACT | Status: DC
  Administered 2013-08-03 (×2): 0.63 mg via RESPIRATORY_TRACT
  Filled 2013-08-03 (×2): qty 3

## 2013-08-03 MED ORDER — SODIUM CHLORIDE 0.9 % IV SOLN
INTRAVENOUS | Status: AC
  Administered 2013-08-04: 04:00:00 via INTRAVENOUS

## 2013-08-03 NOTE — Progress Notes (Signed)
[facility] PROGRESS NOTE Assessment/Plan: Acute respiratory failure due Post obstructive CAP (community acquired pneumonia): - Started on Rocephin and azithromycin on 5.28. - has remained afebrile, still with leukocytosis ( ? If leukocytosis due to steroids and/or gastric and lung mass.) - Will probably need radiation to lung mass. - sating 90 % on RA.  Lung mass/gastric mass/  Hilar adenopathy being evaluated by Dr. Earlie Server: - consult GI: EGD showed no mass. - Pulmonary rec: for EUS and Biopsy on 6.1.2015.  Atrial flutter with rapid ventricular response - Started on diltiazem gtt, transition to oral. No back to Sinues tach, ekg pending. - Not anticoagulation.  COPD with acute exacerbation - Decrease dose and change to oral steroids. - con inhalers.  Benign hypertensive heart disease without heart failure - stable   Hyponatremia: - Urine osmolarity, urine Sodium < 20, FENA < 1%. - resume iv fluids.  Code status: DNR/DNI Family Communication: daughter  Disposition Plan: inpatinet   Consultants:  GI  Procedures:  EGD in am 5.29.2015  Antibiotics:  Rocephin and azithro  HPI/Subjective: No complains, wants to go home.  Objective: Filed Vitals:   08/02/13 2031 08/02/13 2124 08/03/13 0140 08/03/13 0542  BP:  150/80  148/91  Pulse:  118  48  Temp:  98.9 F (37.2 C)  98.1 F (36.7 C)  TempSrc:  Oral  Oral  Resp:  21  22  Height:      Weight:    47 kg (103 lb 9.9 oz)  SpO2: 95% 98% 96% 90%    Intake/Output Summary (Last 24 hours) at 08/03/13 1032 Last data filed at 08/03/13 0933  Gross per 24 hour  Intake    600 ml  Output   1000 ml  Net   -400 ml   Filed Weights   08/01/13 0442 08/02/13 0457 08/03/13 0542  Weight: 47.5 kg (104 lb 11.5 oz) 47.537 kg (104 lb 12.8 oz) 47 kg (103 lb 9.9 oz)    Exam:  General: Alert, awake, oriented x3, in no acute distress. cachectic HEENT: No bruits, no goiter.  Heart: Regular rate and rhythm. Lungs:  Good air movement, clear Abdomen: Soft, nondistended, positive bowel sounds.    Data Reviewed: Basic Metabolic Panel:  Recent Labs Lab 07/30/13 1642 07/30/13 2200 07/31/13 0401 08/01/13 1235 08/02/13 0303 08/03/13 0403  NA 135*  --  128* 134* 137 134*  K 4.6  --  3.7 4.1 4.4 4.2  CL 94*  --  95* 96 98 95*  CO2 25  --  20 25 26 26   GLUCOSE 108*  --  434* 251* 137* 105*  BUN 16  --  11 15 17 19   CREATININE 0.84 0.61 0.56 0.58 0.59 0.56  CALCIUM 11.2*  --  7.9* 9.9 9.9 9.9  MG 1.6  --   --   --   --   --    Liver Function Tests:  Recent Labs Lab 07/29/13 1111  AST 22  ALT 10  ALKPHOS 102  BILITOT 0.62  PROT 7.1  ALBUMIN 2.7*   No results found for this basename: LIPASE, AMYLASE,  in the last 168 hours No results found for this basename: AMMONIA,  in the last 168 hours CBC:  Recent Labs Lab 07/29/13 1111 07/30/13 1642 07/30/13 2200 07/31/13 0401  WBC 12.6* 14.1* 12.7* 12.6*  NEUTROABS 10.1* 11.3*  --  11.8*  HGB 13.2 14.4 12.1 12.0  HCT 39.4 41.3 36.5 35.8*  MCV 98.7 97.9 98.9 97.8  PLT 419*  422* 384 376   Cardiac Enzymes: No results found for this basename: CKTOTAL, CKMB, CKMBINDEX, TROPONINI,  in the last 168 hours BNP (last 3 results)  Recent Labs  07/30/13 1644  PROBNP 2688.0*   CBG:  Recent Labs Lab 07/30/13 2141  GLUCAP 128*    Recent Results (from the past 240 hour(s))  CULTURE, BLOOD (ROUTINE X 2)     Status: None   Collection Time    07/30/13  7:24 PM      Result Value Ref Range Status   Specimen Description BLOOD RIGHT FOREARM   Final   Special Requests BOTTLES DRAWN AEROBIC ONLY 5CC   Final   Culture  Setup Time     Final   Value: 07/31/2013 00:43     Performed at [facility]   Culture     Final   Value:        BLOOD CULTURE RECEIVED NO GROWTH TO DATE CULTURE WILL BE HELD FOR 5 DAYS BEFORE ISSUING A FINAL NEGATIVE REPORT     Performed at [facility]   Report Status PENDING   Incomplete  CULTURE, BLOOD  (ROUTINE X 2)     Status: None   Collection Time    07/30/13  7:34 PM      Result Value Ref Range Status   Specimen Description BLOOD RIGHT FOREARM   Final   Special Requests BOTTLES DRAWN AEROBIC AND ANAEROBIC 5CC   Final   Culture  Setup Time     Final   Value: 07/31/2013 00:43     Performed at [facility]   Culture     Final   Value:        BLOOD CULTURE RECEIVED NO GROWTH TO DATE CULTURE WILL BE HELD FOR 5 DAYS BEFORE ISSUING A FINAL NEGATIVE REPORT     Performed at [facility]   Report Status PENDING   Incomplete  MRSA PCR SCREENING     Status: None   Collection Time    07/30/13  9:59 PM      Result Value Ref Range Status   MRSA by PCR NEGATIVE  NEGATIVE Final   Comment:            The GeneXpert MRSA Assay (FDA     approved for NASAL specimens     only), is one component of a     comprehensive MRSA colonization     surveillance program. It is not     intended to diagnose MRSA     infection nor to guide or     monitor treatment for     MRSA infections.  CULTURE, BLOOD (ROUTINE X 2)     Status: None   Collection Time    07/30/13 10:00 PM      Result Value Ref Range Status   Specimen Description BLOOD RIGHT HAND   Final   Special Requests BOTTLES DRAWN AEROBIC ONLY 5CC   Final   Culture  Setup Time     Final   Value: 07/31/2013 03:54     Performed at [facility]   Culture     Final   Value:        BLOOD CULTURE RECEIVED NO GROWTH TO DATE CULTURE WILL BE HELD FOR 5 DAYS BEFORE ISSUING A FINAL NEGATIVE REPORT     Performed at [facility]   Report Status PENDING   Incomplete  CULTURE, BLOOD (ROUTINE X 2)     Status: None  Collection Time    07/30/13 10:09 PM      Result Value Ref Range Status   Specimen Description BLOOD LEFT HAND   Final   Special Requests BOTTLES DRAWN AEROBIC ONLY 1.5CC   Final   Culture  Setup Time     Final   Value: 07/31/2013 03:54     Performed at [facility]   Culture     Final   Value:         BLOOD CULTURE RECEIVED NO GROWTH TO DATE CULTURE WILL BE HELD FOR 5 DAYS BEFORE ISSUING A FINAL NEGATIVE REPORT     Performed at [facility]   Report Status PENDING   Incomplete  CULTURE, EXPECTORATED SPUTUM-ASSESSMENT     Status: None   Collection Time    07/31/13  9:06 AM      Result Value Ref Range Status   Specimen Description SPUTUM   Final   Special Requests NONE   Final   Sputum evaluation     Final   Value: MICROSCOPIC FINDINGS SUGGEST THAT THIS SPECIMEN IS NOT REPRESENTATIVE OF LOWER RESPIRATORY SECRETIONS. PLEASE RECOLLECT.     Gram Stain Report Called to,Read Back By and Verified With: Darryll Capers RN 11:45 07/31/13 ([email or web address])   Report Status 07/31/2013 FINAL   Final     Studies: No results found.  Scheduled Meds: . atorvastatin  20 mg Oral QHS  . azithromycin  500 mg Oral Q24H  . cefTRIAXone (ROCEPHIN)  IV  1 g Intravenous Q24H  . diltiazem  60 mg Oral 3 times per day  . feeding supplement (ENSURE COMPLETE)  237 mL Oral BID BM  . ipratropium  0.5 mg Nebulization Q6H  . levalbuterol  0.63 mg Nebulization Q6H  . levothyroxine  100 mcg Oral QAC breakfast  . lisinopril  10 mg Oral Daily  . nicotine  14 mg Transdermal Daily  . predniSONE  20 mg Oral Q breakfast  . sodium chloride  3 mL Intravenous Q12H   Continuous Infusions: . sodium chloride 100 mL/hr at 08/03/13 [facility] Pager [phone]. If 8PM-8AM, please contact night-coverage at [email or web address], password [facility] 08/03/2013, 10:32 AM  LOS: 4 days

## 2013-08-04 ENCOUNTER — Encounter (HOSPITAL_COMMUNITY)
Admission: EM | Disposition: A | Discharge status: Home Health | Payer: Self-pay | Source: Home / Self Care | Attending: Internal Medicine

## 2013-08-04 ENCOUNTER — Encounter (HOSPITAL_COMMUNITY): Payer: Self-pay | Admitting: Gastroenterology

## 2013-08-04 ENCOUNTER — Inpatient Hospital Stay (HOSPITAL_COMMUNITY): Payer: Medicare Other

## 2013-08-04 DIAGNOSIS — K319 Disease of stomach and duodenum, unspecified: Secondary | ICD-10-CM

## 2013-08-04 HISTORY — PX: VIDEO BRONCHOSCOPY: SHX5072

## 2013-08-04 LAB — BASIC METABOLIC PANEL
BUN: 11 mg/dL (ref 6–23)
CALCIUM: 9.3 mg/dL (ref 8.4–10.5)
CO2: 27 mEq/L (ref 19–32)
Chloride: 96 mEq/L (ref 96–112)
Creatinine, Ser: 0.5 mg/dL (ref 0.50–1.10)
Glucose, Bld: 87 mg/dL (ref 70–99)
POTASSIUM: 4.3 meq/L (ref 3.7–5.3)
SODIUM: 135 meq/L — AB (ref 137–147)

## 2013-08-04 SURGERY — BRONCHOSCOPY, WITH FLUOROSCOPY
Anesthesia: Moderate Sedation

## 2013-08-04 SURGERY — BRONCHOSCOPY, WITH FLUOROSCOPY
Anesthesia: Moderate Sedation | Laterality: Bilateral

## 2013-08-04 MED ORDER — SODIUM CHLORIDE 0.9 % IV SOLN
Freq: Once | INTRAVENOUS | Status: AC
  Administered 2013-08-04: 11:00:00 via INTRAVENOUS

## 2013-08-04 MED ORDER — MIDAZOLAM HCL 10 MG/2ML IJ SOLN
INTRAMUSCULAR | Status: DC | PRN
  Administered 2013-08-04 (×2): 2 mg via INTRAVENOUS

## 2013-08-04 MED ORDER — LIDOCAINE HCL 1 % IJ SOLN
INTRAMUSCULAR | Status: DC | PRN
  Administered 2013-08-04: 6 mL via RESPIRATORY_TRACT

## 2013-08-04 MED ORDER — PHENYLEPHRINE HCL 0.25 % NA SOLN
1.0000 | Freq: Four times a day (QID) | NASAL | Status: DC | PRN
  Filled 2013-08-04: qty 15

## 2013-08-04 MED ORDER — PHENYLEPHRINE HCL 0.25 % NA SOLN
NASAL | Status: DC | PRN
  Administered 2013-08-04: 1 via NASAL

## 2013-08-04 MED ORDER — FENTANYL CITRATE 0.05 MG/ML IJ SOLN
INTRAMUSCULAR | Status: AC
  Filled 2013-08-04: qty 4

## 2013-08-04 MED ORDER — SODIUM CHLORIDE 0.9 % IV SOLN
INTRAVENOUS | Status: DC
Start: 2013-08-04 — End: 2013-08-06
  Administered 2013-08-04: 10 mL/h via INTRAVENOUS

## 2013-08-04 MED ORDER — EPINEPHRINE HCL 0.1 MG/ML IJ SOSY
PREFILLED_SYRINGE | INTRAMUSCULAR | Status: DC | PRN
  Administered 2013-08-04: 1 via ENDOTRACHEOPULMONARY

## 2013-08-04 MED ORDER — LIDOCAINE HCL 2 % EX GEL
Freq: Once | CUTANEOUS | Status: DC
  Filled 2013-08-04: qty 5

## 2013-08-04 MED ORDER — POLYETHYLENE GLYCOL 3350 17 G PO PACK
17.0000 g | PACK | Freq: Every day | ORAL | Status: DC | PRN
  Administered 2013-08-05: 17 g via ORAL
  Filled 2013-08-04: qty 1

## 2013-08-04 MED ORDER — LIDOCAINE HCL 2 % EX GEL
CUTANEOUS | Status: DC | PRN
  Administered 2013-08-04: 1

## 2013-08-04 MED ORDER — DOCUSATE SODIUM 100 MG PO CAPS
200.0000 mg | ORAL_CAPSULE | Freq: Every day | ORAL | Status: DC
  Administered 2013-08-04 – 2013-08-05 (×2): 200 mg via ORAL
  Filled 2013-08-04 (×3): qty 2

## 2013-08-04 MED ORDER — MIDAZOLAM HCL 5 MG/ML IJ SOLN
INTRAMUSCULAR | Status: AC
  Filled 2013-08-04: qty 2

## 2013-08-04 MED ORDER — FENTANYL CITRATE 0.05 MG/ML IJ SOLN
INTRAMUSCULAR | Status: DC | PRN
  Administered 2013-08-04: 30 ug via INTRAVENOUS

## 2013-08-04 MED ORDER — BUTAMBEN-TETRACAINE-BENZOCAINE 2-2-14 % EX AERO
1.0000 | INHALATION_SPRAY | Freq: Once | CUTANEOUS | Status: DC
  Filled 2013-08-04: qty 56

## 2013-08-04 NOTE — Progress Notes (Signed)
Video Bronchoscopy done  Intervention Bronchial needle aspiration done Intervention Bronchial Biopsy  Done Procedure tolerated well  Agree with above  Elsie Stain

## 2013-08-04 NOTE — Interval H&P Note (Signed)
The pt was seen and examined.  Since prior consult, there are no changes in history or physical exam.  The pt is ready for FOB. Burnett Harry WrightMD

## 2013-08-04 NOTE — Progress Notes (Signed)
PCCM:  Pt scheduled for bronchoscopy at 130pm today 08/04/2013  Emma Harry WrightMD

## 2013-08-04 NOTE — Progress Notes (Signed)
[facility] PROGRESS NOTE Assessment/Plan: Acute respiratory failure due Post obstructive CAP (community acquired pneumonia): - Started on Rocephin and azithromycin on 5.28. - has remained afebrile, still with leukocytosis ( ? If leukocytosis due to steroids and/or gastric and lung mass.) - sating 90 % on RA.  Lung mass/gastric mass/  Hilar adenopathy being evaluated by Dr. Earlie Server: - consult GI: EGD showed no mass. - Pulmonary rec: for EUS and Biopsy on 6.1.2015.- Will probably need radiation to lung mass.  Atrial flutter with rapid ventricular response - Started on diltiazem gtt, transition to oral. No back to Sinues tach, ekg pending. - Not anticoagulation.  COPD with acute exacerbation - Decrease dose and change to oral steroids. - con inhalers.  Benign hypertensive heart disease without heart failure - stable   Hyponatremia: - Urine osmolarity, urine Sodium < 20, FENA < 1%. - resume iv fluids.  Code status: DNR/DNI Family Communication: daughter  Disposition Plan: inpatinet   Consultants:  GI  Procedures:  EGD in am 5.29.2015  Antibiotics:  Rocephin and azithro  HPI/Subjective: No complains, wants to go home.  Objective: Filed Vitals:   08/03/13 1457 08/03/13 2030 08/03/13 2121 08/04/13 0459  BP: 133/97  146/78 142/93  Pulse: 102 100 97 109  Temp: 98.1 F (36.7 C)  98.1 F (36.7 C) 97.8 F (36.6 C)  TempSrc: Oral  Oral Oral  Resp: 20 20 18 22   Height:      Weight:    47.628 kg (105 lb)  SpO2: 90% 93% 94% 93%    Intake/Output Summary (Last 24 hours) at 08/04/13 1000 Last data filed at 08/04/13 0800  Gross per 24 hour  Intake   1680 ml  Output   1750 ml  Net    -70 ml   Filed Weights   08/02/13 0457 08/03/13 0542 08/04/13 0459  Weight: 47.537 kg (104 lb 12.8 oz) 47 kg (103 lb 9.9 oz) 47.628 kg (105 lb)    Exam:  General: Alert, awake, oriented x3, in no acute distress. cachectic HEENT: No bruits, no goiter.  Heart: Regular  rate and rhythm. Lungs: Good air movement, clear Abdomen: Soft, nondistended, positive bowel sounds.    Data Reviewed: Basic Metabolic Panel:  Recent Labs Lab 07/30/13 1642  07/31/13 0401 08/01/13 1235 08/02/13 0303 08/03/13 0403 08/04/13 0522  NA 135*  --  128* 134* 137 134* 135*  K 4.6  --  3.7 4.1 4.4 4.2 4.3  CL 94*  --  95* 96 98 95* 96  CO2 25  --  20 25 26 26 27   GLUCOSE 108*  --  434* 251* 137* 105* 87  BUN 16  --  11 15 17 19 11   CREATININE 0.84  < > 0.56 0.58 0.59 0.56 0.50  CALCIUM 11.2*  --  7.9* 9.9 9.9 9.9 9.3  MG 1.6  --   --   --   --   --   --   < > = values in this interval not displayed. Liver Function Tests:  Recent Labs Lab 07/29/13 1111  AST 22  ALT 10  ALKPHOS 102  BILITOT 0.62  PROT 7.1  ALBUMIN 2.7*   No results found for this basename: LIPASE, AMYLASE,  in the last 168 hours No results found for this basename: AMMONIA,  in the last 168 hours CBC:  Recent Labs Lab 07/29/13 1111 07/30/13 1642 07/30/13 2200 07/31/13 0401  WBC 12.6* 14.1* 12.7* 12.6*  NEUTROABS 10.1* 11.3*  --  11.8*  HGB 13.2 14.4 12.1 12.0  HCT 39.4 41.3 36.5 35.8*  MCV 98.7 97.9 98.9 97.8  PLT 419* 422* 384 376   Cardiac Enzymes: No results found for this basename: CKTOTAL, CKMB, CKMBINDEX, TROPONINI,  in the last 168 hours BNP (last 3 results)  Recent Labs  07/30/13 1644  PROBNP 2688.0*   CBG:  Recent Labs Lab 07/30/13 2141  GLUCAP 128*    Recent Results (from the past 240 hour(s))  CULTURE, BLOOD (ROUTINE X 2)     Status: None   Collection Time    07/30/13  7:24 PM      Result Value Ref Range Status   Specimen Description BLOOD RIGHT FOREARM   Final   Special Requests BOTTLES DRAWN AEROBIC ONLY 5CC   Final   Culture  Setup Time     Final   Value: 07/31/2013 00:43     Performed at [facility]   Culture     Final   Value:        BLOOD CULTURE RECEIVED NO GROWTH TO DATE CULTURE WILL BE HELD FOR 5 DAYS BEFORE ISSUING A FINAL NEGATIVE  REPORT     Performed at [facility]   Report Status PENDING   Incomplete  CULTURE, BLOOD (ROUTINE X 2)     Status: None   Collection Time    07/30/13  7:34 PM      Result Value Ref Range Status   Specimen Description BLOOD RIGHT FOREARM   Final   Special Requests BOTTLES DRAWN AEROBIC AND ANAEROBIC 5CC   Final   Culture  Setup Time     Final   Value: 07/31/2013 00:43     Performed at [facility]   Culture     Final   Value:        BLOOD CULTURE RECEIVED NO GROWTH TO DATE CULTURE WILL BE HELD FOR 5 DAYS BEFORE ISSUING A FINAL NEGATIVE REPORT     Performed at [facility]   Report Status PENDING   Incomplete  MRSA PCR SCREENING     Status: None   Collection Time    07/30/13  9:59 PM      Result Value Ref Range Status   MRSA by PCR NEGATIVE  NEGATIVE Final   Comment:            The GeneXpert MRSA Assay (FDA     approved for NASAL specimens     only), is one component of a     comprehensive MRSA colonization     surveillance program. It is not     intended to diagnose MRSA     infection nor to guide or     monitor treatment for     MRSA infections.  CULTURE, BLOOD (ROUTINE X 2)     Status: None   Collection Time    07/30/13 10:00 PM      Result Value Ref Range Status   Specimen Description BLOOD RIGHT HAND   Final   Special Requests BOTTLES DRAWN AEROBIC ONLY 5CC   Final   Culture  Setup Time     Final   Value: 07/31/2013 03:54     Performed at [facility]   Culture     Final   Value:        BLOOD CULTURE RECEIVED NO GROWTH TO DATE CULTURE WILL BE HELD FOR 5 DAYS BEFORE ISSUING A FINAL NEGATIVE REPORT     Performed at [facility]  Report Status PENDING   Incomplete  CULTURE, BLOOD (ROUTINE X 2)     Status: None   Collection Time    07/30/13 10:09 PM      Result Value Ref Range Status   Specimen Description BLOOD LEFT HAND   Final   Special Requests BOTTLES DRAWN AEROBIC ONLY 1.5CC   Final   Culture  Setup Time     Final    Value: 07/31/2013 03:54     Performed at [facility]   Culture     Final   Value:        BLOOD CULTURE RECEIVED NO GROWTH TO DATE CULTURE WILL BE HELD FOR 5 DAYS BEFORE ISSUING A FINAL NEGATIVE REPORT     Performed at [facility]   Report Status PENDING   Incomplete  CULTURE, EXPECTORATED SPUTUM-ASSESSMENT     Status: None   Collection Time    07/31/13  9:06 AM      Result Value Ref Range Status   Specimen Description SPUTUM   Final   Special Requests NONE   Final   Sputum evaluation     Final   Value: MICROSCOPIC FINDINGS SUGGEST THAT THIS SPECIMEN IS NOT REPRESENTATIVE OF LOWER RESPIRATORY SECRETIONS. PLEASE RECOLLECT.     Gram Stain Report Called to,Read Back By and Verified With: Darryll Capers RN 11:45 07/31/13 ([email or web address])   Report Status 07/31/2013 FINAL   Final     Studies: No results found.  Scheduled Meds: . sodium chloride   Intravenous Once  . atorvastatin  20 mg Oral QHS  . azithromycin  500 mg Oral Q24H  . butamben-tetracaine-benzocaine  1 spray Topical Once  . cefTRIAXone (ROCEPHIN)  IV  1 g Intravenous Q24H  . diltiazem  60 mg Oral 3 times per day  . feeding supplement (ENSURE COMPLETE)  237 mL Oral BID BM  . levothyroxine  100 mcg Oral QAC breakfast  . lidocaine   Topical Once  . lisinopril  10 mg Oral Daily  . nicotine  14 mg Transdermal Daily  . predniSONE  20 mg Oral Q breakfast  . sodium chloride  3 mL Intravenous Q12H   Continuous Infusions:     [facility] Pager [phone]. If 8PM-8AM, please contact night-coverage at [email or web address], password [email or web address] 08/04/2013, 10:00 AM  LOS: 5 days

## 2013-08-04 NOTE — Progress Notes (Signed)
LATE ENTRY!  Patient went to endoscopy test last week (Friday, Jul 29, 2013).  Patient had 1000 medications while she was in Endo.  Patient came back around 1100 and all of her medications were still on hold from being off of the unit.  Medications were given with an "override."  Endo did not override the medications for the patients.

## 2013-08-04 NOTE — Progress Notes (Signed)
UR completed Kaniya Trueheart K. Rosbel Buckner, RN, BSN, [place], CCM  08/04/2013 9:47 AM

## 2013-08-04 NOTE — Care Management Note (Addendum)
  Page 1 of 1   08/04/2013     10:11:02 AM CARE MANAGEMENT NOTE 08/04/2013  Patient:  Emma Douglas, Emma Douglas   Account Number:  [id number]  Date Initiated:  08/04/2013  Documentation initiated by:  Masha Orbach  Subjective/Objective Assessment:   Admitted with chest pain, gastric mass     Action/Plan:   CM to follow for disposition needs   Anticipated DC Date:  08/07/2013   Anticipated DC Plan:  [place]         Choice offered to / List presented to:             Status of service:  In process, will continue to follow Medicare Important Message given?  YES (If response is "NO", the following Medicare IM given date fields will be blank) Date Medicare IM given:  07/30/2013 Date Additional Medicare IM given:  08/04/2013  Discharge Disposition:    Per UR Regulation:    If discussed at Long Length of Stay Meetings, dates discussed:    Comments:  Icesis Renn RN, BSN, MSHL, CCM  Nurse - Case Manager, (Unit [facility])  [phone]  08/04/2013 Diagnostic workup pending for primary source of metastatic ca. Gastric Mass bx Pulmonary Bx scheduled for 08/04/2013 at 1:30pm PT RECS:  Home health PT;Supervision for mobility/OOB DME RECS:  Rolling walker with 5" wheels;3in1 (PT);Other (comment) (Tub bench) Disposition Plan pending.

## 2013-08-04 NOTE — Progress Notes (Signed)
Physical Therapy Treatment Patient Details Name: Emma Douglas MRN: [id number] DOB: December 18, 1940 Today's Date: 08/04/2013    History of Present Illness Pt is a 73 y/o female admitted s/p a visit to her PCP for a cough and HR was 160 in office.  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    PT Comments    Pt is progressing towards physical therapy goals. Pt on RA when PT arrived and at rest, pt is able to maintain O2 sats >90% easily. During gait training, O2 sats dropped to 74%. During this time pt only mildly SOB. Pt was cued for pursed-lip breathing but was struggling with proper technique. Increased time (~5 minutes) and seated rest break required for pt to focus on breathing technique and improve O2 sats to 90%.  Follow Up Recommendations  Home health PT;Supervision for mobility/OOB     Equipment Recommendations  Rolling walker with 5" wheels;3in1 (PT);Other (comment)    Recommendations for Other Services       Precautions / Restrictions Precautions Precautions: Fall Restrictions Weight Bearing Restrictions: No    Mobility  Bed Mobility Overal bed mobility: Needs Assistance Bed Mobility: Supine to Sit     Supine to sit: Supervision;HOB elevated     General bed mobility comments: Pt able to use bed rails for some support, however pt reaching for therapist's hand to pull up to sitting.  Transfers Overall transfer level: Needs assistance Equipment used: Rolling walker (2 wheeled) Transfers: Sit to/from Stand Sit to Stand: Min guard         General transfer comment: VC's for hand placement on seated surface for safety.   Ambulation/Gait Ambulation/Gait assistance: Min guard Ambulation Distance (Feet): 75 Feet Assistive device: Rolling walker (2 wheeled) Gait Pattern/deviations: Step-through pattern;Decreased stride length;Narrow base of support Gait  velocity: Decreased Gait velocity interpretation: Below normal speed for age/gender General Gait Details: Pt able to maneuver through tight spaces with the RW without LOB. VC's for overall safety awareness during these times. Pt was also cued for continuous pursed-lip breathing throughout gait training.   Stairs            Wheelchair Mobility    Modified Rankin (Stroke Patients Only)       Balance Overall balance assessment: Needs assistance Sitting-balance support: Feet supported Sitting balance-Leahy Scale: Fair     Standing balance support: Bilateral upper extremity supported Standing balance-Leahy Scale: Fair                      Cognition Arousal/Alertness: Awake/alert Behavior During Therapy: WFL for tasks assessed/performed Overall Cognitive Status: Within Functional Limits for tasks assessed                      Exercises      General Comments General comments (skin integrity, edema, etc.): Pt drinking coffee upon PT arrival and supposed to be NPO for procedure today. MD aware.       Pertinent Vitals/Pain See assessment for O2 vitals on RA.     Home Living                      Prior Function            PT Goals (current goals can now be found in the care plan section) Acute Rehab PT Goals Patient Stated Goal: To return home PT  Goal Formulation: With patient/family Time For Goal Achievement: 08/14/13 Potential to Achieve Goals: Good Progress towards PT goals: Progressing toward goals    Frequency  Min 3X/week    PT Plan Current plan remains appropriate    Co-evaluation             End of Session Equipment Utilized During Treatment: Gait belt Activity Tolerance: Patient tolerated treatment well Patient left: in chair;with call bell/phone within reach     Time: 0958-1021 PT Time Calculation (min): 23 min  Charges:  $Gait Training: 8-22 mins $Therapeutic Activity: 8-22 mins                    G Codes:       Jolyn Lent 2013-08-05, 1:34 PM  Jolyn Lent, PT, DPT Acute Rehabilitation Services Pager: [phone]

## 2013-08-04 NOTE — H&P (View-Only) (Signed)
PULMONARY / CRITICAL CARE MEDICINE   Name: Emma Douglas MRN: [id number] DOB: 24-Feb-1941    ADMISSION DATE:  07/30/2013 CONSULTATION DATE:  5/29  REFERRING MD :  [facility] PRIMARY SERVICE: [facility]  CHIEF COMPLAINT:  Rapid heart rate   HISTORY OF PRESENT ILLNESS:   73 yo life long smoker who was noted to have 40 lbs weight loss, productive cough,and seen by PCP with Cxr. Following CT scan revealed Mediastinal adenopathy measures up to 2.5 x 3.3 cm in the lower right paratracheal station. Subcarinal adenopathy measures 3.0 x 6.2 cm. Bi hilar adenopathy measures up to 3.6 x 4.3 cm on the left. [name] was evaluated by Emma Douglas of oncology 07/28/13 and was scheduled for PET   She presents to [facility] hospital with CC: of rapid heart rate. CT scan is negative for PE but shows increased mass and adenopathy of chest. She had Upper endoscopic exam 5/29 per GI services for bx of gastric mass. EGD showed no mass. PCCM asked to evaluate. PMH/o htn, copd, subdural hematoma from fall in 2013   PAST MEDICAL HISTORY :  Past Medical History  Diagnosis Date  . Compensated hypothyroidism   . Essential hypertension   . Dyslipidemia   . Colon polyps   . Anxiety   . COPD (chronic obstructive pulmonary disease)     secondary to smoking.  . Avascular necrosis     right femoral head  . Hyperlipidemia   . Anemia      Postoperative hemorrhagic anemia  . Allergic reaction 10/24/2012  . Arthritis     "hands" (07/31/2013)  . Lung mass     /CT 07/11/2013  . Mass of stomach     /CT 07/11/2013  . CAP (community acquired pneumonia) 07/30/2013    "first time I've ever had pneumonia"  . Atrial flutter with rapid ventricular response     Emma Douglas 07/31/2013  . Subdural hemorrhage following injury 2013    fall/notes 07/30/2013   Past Surgical History  Procedure Laterality Date  . Colonoscopy      with polypectomy and biopsy  . Total hip arthroplasty Right 2002  . Dilation and curettage of uterus    . Tubal ligation      Prior to Admission medications   Medication Sig Start Date End Date Taking? Authorizing Provider  EPINEPHrine (EPI-PEN) 0.3 mg/0.3 mL SOAJ injection Inject 0.3 mLs (0.3 mg total) into the muscle once. 10/24/12  Yes Emma B Dixon, PA-C  ibuprofen (ADVIL,MOTRIN) 100 MG chewable tablet Chew 200 mg by mouth every 8 (eight) hours as needed for mild pain.    Yes Historical Provider, MD  levothyroxine (SYNTHROID, LEVOTHROID) 100 MCG tablet Take 100 mcg by mouth daily before breakfast.   Yes Historical Provider, MD  lisinopril (PRINIVIL,ZESTRIL) 10 MG tablet Take 1 tablet (10 mg total) by mouth daily. 07/09/13  Yes Emma Sheldon, PA-C  Multiple Vitamin (MULTIVITAMIN) tablet Take 1 tablet by mouth daily.   Yes Historical Provider, MD  simvastatin (ZOCOR) 40 MG tablet Take 40 mg by mouth at bedtime.   Yes Historical Provider, MD  traMADol (ULTRAM) 50 MG tablet Take 1 tablet (50 mg total) by mouth every 6 (six) hours as needed. 07/29/13  Yes Emma Bears, MD   Allergies  Allergen Reactions  . Black Pepper [[place]] Anaphylaxis  . Tomato Anaphylaxis    Has epi pen    FAMILY HISTORY:  Family History  Problem Relation Age of Onset  . Heart attack Mother   . Heart attack  Father    SOCIAL HISTORY:  reports that she has been smoking Cigarettes.  She has a 9.5 pack-year smoking history. She has never used smokeless tobacco. She reports that she drinks alcohol. She reports that she does not use illicit drugs.  Smokes 1/2 pPD x 50 yrs , about 25 pyrs + cigars  REVIEW OF SYSTEMS: 10 point review of system taken, please see HPI for positives and negatives. 40 lb wt loss over past year White sputum, no hemoptysis DOe after walking 30 yards  SUBJECTIVE:   VITAL SIGNS: Temp:  [98.1 F (36.7 C)-98.9 F (37.2 C)] 98.4 F (36.9 C) (05/29 0746) Pulse Rate:  [73-91] 80 (05/29 0930) Resp:  [18-95] 24 (05/29 0930) BP: (102-143)/(42-71) 139/50 mmHg (05/29 0930) SpO2:  [87 %-100 %] 89 % (05/29 0930) FiO2  (%):  [98 %] 98 % (05/29 0910) Weight:  [47.5 kg (104 lb 11.5 oz)] 47.5 kg (104 lb 11.5 oz) (05/29 0442) HEMODYNAMICS:   VENTILATOR SETTINGS: Vent Mode:  [-]  FiO2 (%):  [98 %] 98 % INTAKE / OUTPUT: Intake/Output     05/28 0701 - 05/29 0700 05/29 0701 - 05/30 0700   P.O. 720    I.V. (mL/kg)  200 (4.2)   Total Intake(mL/kg) 720 (15.2) 200 (4.2)   Urine (mL/kg/hr) 575 (0.5) 1125 (5.1)   Total Output 575 1125   Net +145 -925          PHYSICAL EXAMINATION: General:  Frail, thin AAF with bizarre affect  Neuro:  MAE x4, no overt deficits  HEENT:  No LAN/JVD, poor dentation Cardiovascular:  hsr rrr Lungs:  Coarse rhonchi bilat, purulent gray secretions  Abdomen:  Thin + bs Musculoskeletal:  Intatc Skin:  warm  LABS:  CBC  Recent Labs Lab 07/30/13 1642 07/30/13 2200 07/31/13 0401  WBC 14.1* 12.7* 12.6*  HGB 14.4 12.1 12.0  HCT 41.3 36.5 35.8*  PLT 422* 384 376   Coag's  Recent Labs Lab 07/30/13 1642  INR 0.96   BMET  Recent Labs Lab 07/29/13 1111 07/30/13 1642 07/30/13 2200 07/31/13 0401  NA 134* 135*  --  128*  K 3.8 4.6  --  3.7  CL  --  94*  --  95*  CO2 25 25  --  20  BUN 12.0 16  --  11  CREATININE 0.8 0.84 0.61 0.56  GLUCOSE 118 108*  --  434*   Electrolytes  Recent Labs Lab 07/29/13 1111 07/30/13 1642 07/31/13 0401  CALCIUM 10.3 11.2* 7.9*  MG  --  1.6  --    Sepsis Markers  Recent Labs Lab 07/30/13 1951  LATICACIDVEN 1.77   ABG No results found for this basename: PHART, PCO2ART, PO2ART,  in the last 168 hours Liver Enzymes  Recent Labs Lab 07/29/13 1111  AST 22  ALT 10  ALKPHOS 102  BILITOT 0.62  ALBUMIN 2.7*   Cardiac Enzymes  Recent Labs Lab 07/30/13 1644  PROBNP 2688.0*   Glucose  Recent Labs Lab 07/30/13 2141  GLUCAP 128*    Imaging Ct Angio Chest Pe W/cm &/or Wo Cm  07/30/2013   CLINICAL DATA:  Shortness of breath and cough.  Atrial flutter.  EXAM: CT ANGIOGRAPHY CHEST WITH CONTRAST  TECHNIQUE:  Multidetector CT imaging of the chest was performed using the standard protocol during bolus administration of intravenous contrast. Multiplanar CT image reconstructions and MIPs were obtained to evaluate the vascular anatomy.  CONTRAST:  175mL OMNIPAQUE IOHEXOL 350 MG/ML SOLN  COMPARISON:  Chest  x-ray 07/30/2013 CT scan dated 07/11/2013  FINDINGS: There are no definitive pulmonary emboli. There has been marked progression of mediastinal tumor as well as the hilar tumor masses as well as the tumor in the lingula. There is extensive spread of tumor into the left lower lobe. Tumor encases and occludes peripheral arteries in the left lower lobe. There is now occlusion of the right middle lobe bronchus and of the left lower lobe bronchus with almost complete occlusion of the left upper lobe bronchus and with marked narrowing of the right lower lobe bronchus.  There is new hazy infiltrate in the inferior aspect of the right upper lobe. There is new loculated pleural fluid posterior to the left upper lobe.  The tumor compresses the pulmonary arteries bilaterally. The tumor extends posterior to the left atrium and has a mass effect upon the left atrium.  No acute osseous abnormalities.  Review of the MIP images confirms the above findings.  IMPRESSION: 1. Marked progression of mediastinal and hilar tumor with with new extensive tumor extension into the left lower lobe and enlargement of the mass in the lingula. The small irregular mass in the right upper lobe is unchanged.  2. New obstruction of the left lower lobe bronchus, near obstruction of the left upper lobe bronchus, complete obstruction of the right middle lobe bronchus, and severe narrowing of the right lower lobe bronchus.   Electronically Signed   By: Rozetta Nunnery M.D.   On: 07/30/2013 20:46   Dg Chest Port 1 View  07/30/2013   CLINICAL DATA:  Cough, wheezing  EXAM: PORTABLE CHEST - 1 VIEW  COMPARISON:  07/11/2013 and earlier studies  FINDINGS: Bilateral  hilar adenopathy. Progressive airspace consolidation in the basilar segments left lower lobe. New interstitial opacities around the left hilum and in the right infrahilar region. Probable left pleural effusion. . Atheromatous aorta.  Heart size upper limits normal. Degenerative changes in bilateral shoulders.  IMPRESSION: 1. Progressive consolidation/atelectasis in the left lower lung, with probable small effusion. 2. Persistent bilateral hilar adenopathy.   Electronically Signed   By: Arne Cleveland M.D.   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 Plan for bronchoscopy on Monday 7-30 am, consent, npo after mN night before, The various options of biopsy including bronchoscopy, CT guided needle aspiration and surgical biopsy were discussed.The risks of each procedure including coughing, bleeding and the  chances of lung puncture requiring chest tube were discussed in great detail. The benefits & alternatives including serial follow up were also discussed. Can also perform TBNA subcarinal to stage at the same time PET scan as outpt scheduled for 6/5 2. Agree with abx/BD will add Atrovent neb., drop solumedrol 40 q 12h since no bspasm & taper in 48h 3. Smoking cessation ! 4. Ct antibiotics for post obs pna  DNR noted   Care during the described time interval was provided by me and/or other providers on the critical care team.  I have reviewed this patient's available data, including medical history, events of note, physical examination and test results as  part of my evaluation  Kara Mead MD. [facility] Flood. [facility] Pager [phone] If no response call [phone]   08/01/2013, 2:59 PM

## 2013-08-04 NOTE — Op Note (Signed)
Bronchoscopy Procedure Note  Date of Operation: 08/04/2013  Pre-op Diagnosis: Lung mass  Post-op Diagnosis: same, prob CA of lung Stage IIIB at least  Surgeon: Elsie Stain  Anesthesia: Monitored Local Anesthesia with Sedation:    Versed 4 mg IVP;  Fentanyl 30 mcg IVP  Operation: Flexible fiberoptic bronchoscopy, diagnostic   Findings: splayed carina, exophytic friable LLL endobronchial lesion  Specimen: Bronchial Bx LLL, Wang Aspirate carina, LLL bronch washing  Estimated Blood Loss: less than 50   Complications: significant bleeding LLL  Indications and History: The patient is a 73 y.o. female with lung mass .  The risks, benefits, complications, treatment options and expected outcomes were discussed with the patient.  The possibilities of reaction to medication, pulmonary aspiration, perforation of a viscus, bleeding, failure to diagnose a condition and creating a complication requiring transfusion or operation were discussed with the patient who freely signed the consent.    Description of Procedure: The patient was re-examined in the bronchoscopy suite and the site of surgery properly noted/marked.  The patient was identified as [place] and the procedure verified as Flexible Fiberoptic Bronchoscopy.  A Time Out was held and the above information confirmed.   After the induction of topical nasopharyngeal anesthesia, the patient was positioned  and the bronchoscope was passed through the R  nares. The vocal cords were visualized and  1% buffered lidocaine 5 ml was topically placed onto the cords. The cords were normal. The scope was then passed into the trachea.  1% buffered lidocaine 5 ml was used topically on the carina.  Careful inspection of the tracheal lumen was accomplished. The scope was sequentially passed into the left main and then left upper and lower bronchi and segmental bronchi.     Bronchial Bx and washing  was done and there was two specimen.   Carina  Wang aspirate x 3 done   The scope was then withdrawn and advanced into the right main bronchus and then into the RUL, RML, and RLL bronchi and segmental bronchi.      Endobronchial findings: exophytic friable lesion in Left lower lobe Trachea: Normal mucosa Carina: Blunted Right main bronchus: Edematous mucosa Right upper lobe bronchus: Edematous mucosa Right middle lobe bronchus: Edematous mucosa Right lower lobe bronchus: Edematous mucosa Left main bronchus: edematous mucosa Left upper lobe bronchus: Edematous mucosa Left lower lobe bronchus: Collapse with endobronchial lesion ,friable  The Patient was taken to the Endoscopy Recovery area in satisfactory condition.  Attestation: I performed the procedure.  Burnett Harry WrightMD 08/04/2013 1:52 PM

## 2013-08-04 NOTE — Progress Notes (Signed)
Pt has order to d/c telemetry on 5/31 pt still has telemetry monitor on, per monitor tech, telemetry has not been removed. Pt HR going between regular SR and irregular sinus arrythmia HR 80-104. Pt also had bronchoscopy today. MD on call Tylene Fantasia notified to clarify order. New order for telemetry x24 hours.

## 2013-08-05 ENCOUNTER — Encounter (HOSPITAL_COMMUNITY): Payer: Self-pay | Admitting: Critical Care Medicine

## 2013-08-05 DIAGNOSIS — R634 Abnormal weight loss: Secondary | ICD-10-CM

## 2013-08-05 MED ORDER — LEVOFLOXACIN 750 MG PO TABS
750.0000 mg | ORAL_TABLET | ORAL | Status: DC
  Administered 2013-08-05: 750 mg via ORAL
  Filled 2013-08-05: qty 1

## 2013-08-05 NOTE — Progress Notes (Signed)
[facility] PROGRESS NOTE Assessment/Plan: Acute respiratory failure due Post obstructive CAP (community acquired pneumonia): - Started on Rocephin and azithromycin on 5.28. Transition to oral levaquin on 6.2.2015. - has remained afebrile, still with leukocytosis ( ? If leukocytosis due to steroids and/or gastric and lung mass.) - sating 90 % on RA.  Lung mass/gastric mass/  Hilar adenopathy being evaluated by Dr. Earlie Server: - consult GI: EGD showed no mass. - Pulmonary rec: for EUS and Biopsy on 6.1.2015. Bx pending - Will probably need radiation to lung mass.  Atrial flutter with rapid ventricular response - Started on diltiazem gtt, transition to oral. No back to Sinues tach, ekg pending. - Not anticoagulation.  COPD with acute exacerbation - Decrease dose and change to oral steroids. - con inhalers.   Benign hypertensive heart disease without heart failure - stable.   Hyponatremia: - Urine osmolarity, urine Sodium < 20, FENA < 1%. - resume iv fluids.  Code status: DNR/DNI Family Communication: daughter  Disposition Plan: inpatinet   Consultants:  GI  Procedures:  EGD in am 5.29.2015  Antibiotics:  Rocephin and Azithro  levaquin  HPI/Subjective: No complains.  Objective: Filed Vitals:   08/05/13 0503 08/05/13 0536 08/05/13 0540 08/05/13 0947  BP: 110/60   126/74  Pulse: 81   84  Temp: 98.2 F (36.8 C)     TempSrc: Oral     Resp: 22   20  Height:      Weight: 46.72 kg (103 lb)     SpO2: 93% 90% 96% 95%    Intake/Output Summary (Last 24 hours) at 08/05/13 1045 Last data filed at 08/05/13 1000  Gross per 24 hour  Intake    843 ml  Output   1400 ml  Net   -557 ml   Filed Weights   08/03/13 0542 08/04/13 0459 08/05/13 0503  Weight: 47 kg (103 lb 9.9 oz) 47.628 kg (105 lb) 46.72 kg (103 lb)    Exam:  General: Alert, awake, oriented x3, in no acute distress. cachectic HEENT: No bruits, no goiter.  Heart: Regular rate and  rhythm. Lungs: Good air movement, clear Abdomen: Soft, nondistended, positive bowel sounds.    Data Reviewed: Basic Metabolic Panel:  Recent Labs Lab 07/30/13 1642  07/31/13 0401 08/01/13 1235 08/02/13 0303 08/03/13 0403 08/04/13 0522  NA 135*  --  128* 134* 137 134* 135*  K 4.6  --  3.7 4.1 4.4 4.2 4.3  CL 94*  --  95* 96 98 95* 96  CO2 25  --  20 25 26 26 27   GLUCOSE 108*  --  434* 251* 137* 105* 87  BUN 16  --  11 15 17 19 11   CREATININE 0.84  < > 0.56 0.58 0.59 0.56 0.50  CALCIUM 11.2*  --  7.9* 9.9 9.9 9.9 9.3  MG 1.6  --   --   --   --   --   --   < > = values in this interval not displayed. Liver Function Tests:  Recent Labs Lab 07/29/13 1111  AST 22  ALT 10  ALKPHOS 102  BILITOT 0.62  PROT 7.1  ALBUMIN 2.7*   No results found for this basename: LIPASE, AMYLASE,  in the last 168 hours No results found for this basename: AMMONIA,  in the last 168 hours CBC:  Recent Labs Lab 07/29/13 1111 07/30/13 1642 07/30/13 2200 07/31/13 0401  WBC 12.6* 14.1* 12.7* 12.6*  NEUTROABS 10.1* 11.3*  --  11.8*  HGB 13.2 14.4 12.1 12.0  HCT 39.4 41.3 36.5 35.8*  MCV 98.7 97.9 98.9 97.8  PLT 419* 422* 384 376   Cardiac Enzymes: No results found for this basename: CKTOTAL, CKMB, CKMBINDEX, TROPONINI,  in the last 168 hours BNP (last 3 results)  Recent Labs  07/30/13 1644  PROBNP 2688.0*   CBG:  Recent Labs Lab 07/30/13 2141  GLUCAP 128*    Recent Results (from the past 240 hour(s))  CULTURE, BLOOD (ROUTINE X 2)     Status: None   Collection Time    07/30/13  7:24 PM      Result Value Ref Range Status   Specimen Description BLOOD RIGHT FOREARM   Final   Special Requests BOTTLES DRAWN AEROBIC ONLY 5CC   Final   Culture  Setup Time     Final   Value: 07/31/2013 00:43     Performed at [facility]   Culture     Final   Value:        BLOOD CULTURE RECEIVED NO GROWTH TO DATE CULTURE WILL BE HELD FOR 5 DAYS BEFORE ISSUING A FINAL NEGATIVE REPORT      Performed at [facility]   Report Status PENDING   Incomplete  CULTURE, BLOOD (ROUTINE X 2)     Status: None   Collection Time    07/30/13  7:34 PM      Result Value Ref Range Status   Specimen Description BLOOD RIGHT FOREARM   Final   Special Requests BOTTLES DRAWN AEROBIC AND ANAEROBIC 5CC   Final   Culture  Setup Time     Final   Value: 07/31/2013 00:43     Performed at [facility]   Culture     Final   Value:        BLOOD CULTURE RECEIVED NO GROWTH TO DATE CULTURE WILL BE HELD FOR 5 DAYS BEFORE ISSUING A FINAL NEGATIVE REPORT     Performed at [facility]   Report Status PENDING   Incomplete  MRSA PCR SCREENING     Status: None   Collection Time    07/30/13  9:59 PM      Result Value Ref Range Status   MRSA by PCR NEGATIVE  NEGATIVE Final   Comment:            The GeneXpert MRSA Assay (FDA     approved for NASAL specimens     only), is one component of a     comprehensive MRSA colonization     surveillance program. It is not     intended to diagnose MRSA     infection nor to guide or     monitor treatment for     MRSA infections.  CULTURE, BLOOD (ROUTINE X 2)     Status: None   Collection Time    07/30/13 10:00 PM      Result Value Ref Range Status   Specimen Description BLOOD RIGHT HAND   Final   Special Requests BOTTLES DRAWN AEROBIC ONLY 5CC   Final   Culture  Setup Time     Final   Value: 07/31/2013 03:54     Performed at [facility]   Culture     Final   Value:        BLOOD CULTURE RECEIVED NO GROWTH TO DATE CULTURE WILL BE HELD FOR 5 DAYS BEFORE ISSUING A FINAL NEGATIVE REPORT     Performed at [facility]  Report Status PENDING   Incomplete  CULTURE, BLOOD (ROUTINE X 2)     Status: None   Collection Time    07/30/13 10:09 PM      Result Value Ref Range Status   Specimen Description BLOOD LEFT HAND   Final   Special Requests BOTTLES DRAWN AEROBIC ONLY 1.5CC   Final   Culture  Setup Time     Final   Value:  07/31/2013 03:54     Performed at [facility]   Culture     Final   Value:        BLOOD CULTURE RECEIVED NO GROWTH TO DATE CULTURE WILL BE HELD FOR 5 DAYS BEFORE ISSUING A FINAL NEGATIVE REPORT     Performed at [facility]   Report Status PENDING   Incomplete  CULTURE, EXPECTORATED SPUTUM-ASSESSMENT     Status: None   Collection Time    07/31/13  9:06 AM      Result Value Ref Range Status   Specimen Description SPUTUM   Final   Special Requests NONE   Final   Sputum evaluation     Final   Value: MICROSCOPIC FINDINGS SUGGEST THAT THIS SPECIMEN IS NOT REPRESENTATIVE OF LOWER RESPIRATORY SECRETIONS. PLEASE RECOLLECT.     Gram Stain Report Called to,Read Back By and Verified With: Darryll Capers RN 11:45 07/31/13 ([email or web address])   Report Status 07/31/2013 FINAL   Final     Studies: Dg C-arm Bronchoscopy  08/04/2013   CLINICAL DATA: bronch procedure   C-ARM BRONCHOSCOPY  Fluoroscopy was utilized by the requesting physician.  No radiographic  interpretation.     Scheduled Meds: . atorvastatin  20 mg Oral QHS  . azithromycin  500 mg Oral Q24H  . cefTRIAXone (ROCEPHIN)  IV  1 g Intravenous Q24H  . diltiazem  60 mg Oral 3 times per day  . docusate sodium  200 mg Oral QHS  . feeding supplement (ENSURE COMPLETE)  237 mL Oral BID BM  . levothyroxine  100 mcg Oral QAC breakfast  . lisinopril  10 mg Oral Daily  . nicotine  14 mg Transdermal Daily  . sodium chloride  3 mL Intravenous Q12H   Continuous Infusions: . sodium chloride 10 mL/hr (08/04/13 1325)     [facility] Pager [phone]. If 8PM-8AM, please contact night-coverage at [email or web address], password [facility] 08/05/2013, 10:45 AM  LOS: 6 days

## 2013-08-05 NOTE — Progress Notes (Signed)
Pt coughed up small amount of bright red mucous this AM on tissue after breathing treatment and flutter valve with RT. Pt with rhonchi to all lobes. Pt in NAD. O2 sat 96% on 1.5L. Pt had bronchoscopy done yesterday, per MD note pt had bleeding complication to LLL during procedure. MD on call paged via [place]. Pt educated to notify RN if she coughs up any other blood in sputum

## 2013-08-05 NOTE — Progress Notes (Signed)
PULMONARY / CRITICAL CARE MEDICINE   Name: Emma Douglas MRN: [id number] DOB: December 04, 1940    ADMISSION DATE:  07/30/2013 CONSULTATION DATE:  5/29  REFERRING MD :  [facility] PRIMARY SERVICE: [facility]  CHIEF COMPLAINT:  Rapid heart rate   HISTORY OF PRESENT ILLNESS:   73 yo life long smoker who was noted to have 40 lbs weight loss, productive cough,and seen by PCP with Cxr. Following CT scan revealed Mediastinal adenopathy measures up to 2.5 x 3.3 cm in the lower right paratracheal station. Subcarinal adenopathy measures 3.0 x 6.2 cm. Bi hilar adenopathy measures up to 3.6 x 4.3 cm on the left. Sh was evaluated by Dr. Blima Rich of oncology 07/28/13 and was scheduled for PET   She presents to [facility] hospital with CC: of rapid heart rate. CT scan is negative for PE but shows increased mass and adenopathy of chest. She had Upper endoscopic exam 5/29 per GI services for bx of gastric mass. EGD showed no mass. PCCM asked to evaluate. PMH/o htn, copd, subdural hematoma from fall in 2013  Culture data BCX2 5/27>>>   ABX azith 5/27>>> Rocephin 5/27>>>  Events/studies FOB 6/1: Carina: Blunted Right main bronchus: Edematous mucosa;Right upper lobe bronchus: Edematous mucosa  Right middle lobe bronchus: Edematous mucosa; Right lower lobe bronchus: Edematous mucosa; Left main bronchus: edematous mucosa; Left upper lobe bronchus: Edematous mucosa; Left lower lobe bronchus: Collapse with endobronchial lesion ,friable Cytology 6/1>>> Path 6/1>>>  SUBJECTIVE:  No distress   VITAL SIGNS: Temp:  [97.5 F (36.4 C)-99.1 F (37.3 C)] 98.2 F (36.8 C) (06/02 0503) Pulse Rate:  [70-92] 81 (06/02 0503) Resp:  [17-26] 22 (06/02 0503) BP: (110-182)/(60-102) 110/60 mmHg (06/02 0503) SpO2:  [90 %-100 %] 96 % (06/02 0540) Weight:  [46.72 kg (103 lb)] 46.72 kg (103 lb) (06/02 0503) 1.5 liters       INTAKE / OUTPUT: Intake/Output     06/01 0701 - 06/02 0700 06/02 0701 - 06/03 0700   P.O. 360    I.V.  (mL/kg) 3 (0.1)    Total Intake(mL/kg) 363 (7.8)    Urine (mL/kg/hr) 1550 (1.4)    Total Output 1550     Net -1187          Urine Occurrence 1 x      PHYSICAL EXAMINATION: General:  Frail, thin AAF no acute distress   Neuro:  MAE x4, no overt deficits  HEENT:  No LAN/JVD, poor dentation Cardiovascular:  hsr rrr Lungs:  Coarse rhonchi bilat, scant blood tinged mucous after FOB  Abdomen:  Thin + bs Musculoskeletal:  Intatc Skin:  warm  LABS:  CBC  Recent Labs Lab 07/30/13 1642 07/30/13 2200 07/31/13 0401  WBC 14.1* 12.7* 12.6*  HGB 14.4 12.1 12.0  HCT 41.3 36.5 35.8*  PLT 422* 384 376   BMET  Recent Labs Lab 08/02/13 0303 08/03/13 0403 08/04/13 0522  NA 137 134* 135*  K 4.4 4.2 4.3  CL 98 95* 96  CO2 26 26 27   BUN 17 19 11   CREATININE 0.59 0.56 0.50  GLUCOSE 137* 105* 87     Imaging Dg C-arm Bronchoscopy  08/04/2013   CLINICAL DATA: bronch procedure   C-ARM BRONCHOSCOPY  Fluoroscopy was utilized by the requesting physician.  No radiographic  interpretation.      ASSESSMENT  Lung mass w/ LLL atelectasis & post obstructive pneumonia. 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  DNR noted    08/05/2013, 8:50 AM

## 2013-08-05 NOTE — Progress Notes (Signed)
[facility] MD Note   - path still pending. AT time of my eval Emma Douglas is sleeping and did not disturb. We await path. Appears malnourished and so might affect onc planning.   Dr. Brand Males, M.D., [facility].C.P Pulmonary and Critical Care Medicine Staff Physician [place] [facility] Pager: [phone], If no answer or between  15:00h - 7:00h: call [phone]  08/05/2013 11:53 AM

## 2013-08-06 DIAGNOSIS — F17200 Nicotine dependence, unspecified, uncomplicated: Secondary | ICD-10-CM

## 2013-08-06 DIAGNOSIS — E039 Hypothyroidism, unspecified: Secondary | ICD-10-CM

## 2013-08-06 DIAGNOSIS — E43 Unspecified severe protein-calorie malnutrition: Secondary | ICD-10-CM

## 2013-08-06 DIAGNOSIS — E7800 Pure hypercholesterolemia, unspecified: Secondary | ICD-10-CM

## 2013-08-06 DIAGNOSIS — C3490 Malignant neoplasm of unspecified part of unspecified bronchus or lung: Secondary | ICD-10-CM

## 2013-08-06 LAB — CULTURE, BLOOD (ROUTINE X 2)
CULTURE: NO GROWTH
CULTURE: NO GROWTH
Culture: NO GROWTH
Culture: NO GROWTH

## 2013-08-06 MED ORDER — DSS 100 MG PO CAPS: 200.0000 mg | ORAL_CAPSULE | Freq: Every day | ORAL | Status: DC

## 2013-08-06 MED ORDER — ATORVASTATIN CALCIUM 20 MG PO TABS: 20.0000 mg | ORAL_TABLET | Freq: Every day | ORAL | Status: AC

## 2013-08-06 MED ORDER — GUAIFENESIN-DM 100-10 MG/5ML PO SYRP: 5.0000 mL | ORAL_SOLUTION | ORAL | Status: DC | PRN

## 2013-08-06 MED ORDER — POLYETHYLENE GLYCOL 3350 17 G PO PACK: 17.0000 g | PACK | Freq: Every day | ORAL | Status: AC | PRN

## 2013-08-06 MED ORDER — NICOTINE 14 MG/24HR TD PT24: 14.0000 mg | MEDICATED_PATCH | Freq: Every day | TRANSDERMAL | Status: DC

## 2013-08-06 MED ORDER — DILTIAZEM HCL 60 MG PO TABS: 60.0000 mg | ORAL_TABLET | Freq: Three times a day (TID) | ORAL | Status: AC

## 2013-08-06 MED ORDER — IPRATROPIUM-ALBUTEROL 20-100 MCG/ACT IN AERS: 1.0000 | INHALATION_SPRAY | Freq: Four times a day (QID) | RESPIRATORY_TRACT | Status: AC

## 2013-08-06 MED ORDER — LEVOFLOXACIN 750 MG PO TABS: 750.0000 mg | ORAL_TABLET | ORAL | Status: DC

## 2013-08-06 MED ORDER — ENSURE COMPLETE PO LIQD: 237.0000 mL | Freq: Three times a day (TID) | ORAL | Status: DC

## 2013-08-06 NOTE — Progress Notes (Addendum)
PULMONARY / CRITICAL CARE MEDICINE   Name: Emma Douglas MRN: [id number] DOB: June 15, 1940    ADMISSION DATE:  07/30/2013 CONSULTATION DATE:  5/29  REFERRING MD :  triad PRIMARY SERVICE: triad  CHIEF COMPLAINT:  Rapid heart rate   HISTORY OF PRESENT ILLNESS:   73 yo life long smoker who was noted to have 40 lbs weight loss, productive cough,and seen by PCP with Cxr. Following CT scan revealed Mediastinal adenopathy measures up to 2.5 x 3.3 cm in the lower right paratracheal station. Subcarinal adenopathy measures 3.0 x 6.2 cm. Bi hilar adenopathy measures up to 3.6 x 4.3 cm on the left. Sh was evaluated by Dr. Blima Rich of oncology 07/28/13 and was scheduled for PET   She presents to [facility] with CC: of rapid heart rate. CT scan is negative for PE but shows increased mass and adenopathy of chest. She had Upper endoscopic exam 5/29 per GI services for bx of gastric mass. EGD showed no mass. PCCM asked to evaluate. 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 She is crying from time I walked in room. Not afraid of bad news but knows is bad news. Wants to go home. Wants sun. Hates hospital. Life has no meaning inside hospital' feels like "vegetable". Says god will take care of her. Wants cancer rx  VITAL SIGNS: Temp:  [98 F (36.7 C)-98.8 F (37.1 C)] 98.2 F (36.8 C) (06/03 1008) Pulse Rate:  [76-86] 82 (06/03 1017) Resp:  [18] 18 (06/03  1008) BP: (116-154)/(53-84) 116/67 mmHg (06/03 1017) SpO2:  [96 %-100 %] 100 % (06/03 1017) Weight:  [45.8 kg (100 lb 15.5 oz)] 45.8 kg (100 lb 15.5 oz) (06/03 0642) 1.5 liters       INTAKE / OUTPUT: Intake/Output     06/02 0701 - 06/03 0700 06/03 0701 - 06/04 0700   P.O. 960 240   I.V. (mL/kg)  3 (0.1)   Total Intake(mL/kg) 960 (21) 243 (5.3)   Urine (mL/kg/hr) 1150 (1) 400 (1.6)   Total Output 1150 400   Net -190 -157        Urine Occurrence 2 x      PHYSICAL EXAMINATION: General:  Frail, thin AAF no acute distress   Pscyh: depressed Neuro:  MAE x4, no overt deficits  HEENT:  No LAN/JVD, poor dentation Cardiovascular:  hsr rrr Lungs:  Coarse rhonchi bilat, scant blood tinged mucous after FOB  Abdomen:  Thin + bs Musculoskeletal:  Intatc Skin:  warm  LABS:  CBC  Recent Labs Lab 07/30/13 1642 07/30/13 2200 07/31/13 0401  WBC 14.1* 12.7* 12.6*  HGB 14.4 12.1 12.0  HCT 41.3 36.5 35.8*  PLT 422* 384 376   BMET  Recent Labs Lab 08/02/13 0303 08/03/13 0403 08/04/13 0522  NA 137 134* 135*  K 4.4 4.2 4.3  CL 98 95* 96  CO2 26 26 27   BUN 17 19 11   CREATININE 0.59 0.56 0.50  GLUCOSE 137* 105* 87     Imaging Dg C-arm Bronchoscopy  08/04/2013   CLINICAL  DATA: bronch procedure   C-ARM BRONCHOSCOPY  Fluoroscopy was utilized by the requesting physician.  No radiographic  interpretation.      ASSESSMENT  Lung mass w/ LLL atelectasis & post obstructive pneumonia. 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  Needs opd  onc eval Dr Julien Nordmann  ? Home today - she is desperate to go home DNR noted Consider anti-depressants  PCCM will sign off OPD fu with Dr Elsworth Soho for COPD - 09/01/13 at 11am Will need onc followup with Dr Julien Nordmann   Dr. Brand Males, M.D., [facility].C.P Pulmonary and Critical Care Medicine Staff Physician [place] [facility] Pager: [phone], If no answer or between  15:00h - 7:00h: call [phone]  08/06/2013 12:28 PM

## 2013-08-06 NOTE — Discharge Summary (Signed)
Physician Discharge Summary  Emma Douglas URK:[id number] DOB: 1941/02/21 DOA: 07/30/2013  PCP: Karis Juba, PA-C  Admit date: 07/30/2013 Discharge date: 08/06/2013  Time spent: >30 minutes  Recommendations for Outpatient Follow-up:  1. BMET/CBC to follow Hgb, WBC's trend, electrolytes and renal function 2. Followup with oncology service and pulmonologist  Discharge Diagnoses:  Principal Problem:   CAP (community acquired pneumonia) Active Problems:   Benign hypertensive heart disease without heart failure   Subdural hematoma, post-traumatic in 2013 after fall   Cough   Atrial flutter with rapid ventricular response   COPD with acute exacerbation   Hilar adenopathy being evaluated by Dr. Earlie Server   Gastric mass   Acute respiratory failure   Small cell lung cancer   Discharge Condition: Stable for discharge. Patient will be discharged home with instructions to follow with oncology service and with appointment for a PET scan as an outpatient.   Diet recommendation:  low-sodium diet   Filed Weights   08/04/13 0459 08/05/13 0503 08/06/13 0642  Weight: 47.628 kg (105 lb) 46.72 kg (103 lb) 45.8 kg (100 lb 15.5 oz)    History of present illness:  73 yo female h/o htn, copd, subdural hematoma from fall in 2013, went to see pcp today for cough and her heart rate was 160. She denies any cp or palpitations. She has been coughing a lot for weeks and progressive worsening wheezing for a week also. No fevers, no chills. No n/v/d. No le swelling or edema. 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 She has been set up for outpt PET/mri brain which is scheduled for next week. She was on she believes a zpack several weeks ago for bronchitis. She has no inhalers at home. She lives with her daughter who is present, she denies any pain anywhere.    Hospital Course:  Acute respiratory failure due Post  obstructive CAP (community acquired pneumonia):  - Started on Rocephin and azithromycin on 5.28. Transition to oral levaquin on 6.2.2015.  - has remained afebrile, still with mild leukocytosis ( ? If leukocytosis due to steroids and/or gastric and lung mass.)  - sating 9294 % on RA at discharge.   Lung mass/gastric mass/ Hilar adenopathy being evaluated by Dr. Earlie Server:  - consult GI: EGD showed no mass.  - Pulmonary rec: for EUS and Biopsy on 6.1.2015 results are demonstrating small cell lung cancer - Will probably need chemotherapy and radiation to lung mass.  -Patient will have a PET scan on 08/08/2013 and will follow with Dr. Earlie Server (oncology service) on 08/15/2013.  Atrial flutter with rapid ventricular response  - Started on diltiazem gtt, transition to oral. Now back to Sinus rhythm -will continue cardizem PO - Not anticoagulation.   Acute bronchitis/COPD exacerbation  -Improve and according to patient breathing back to her baseline. -Will discharge on Levaquin to finish antibiotic regimen -Patient started on Combivent 1 puff every 6 hours -advised to stop smoking -Patient will follow with pulmonary service on 09/01/2013 for further evaluation and treatment of her COPD.  Benign hypertensive heart disease without heart failure  -Blood pressure stable.  -Continue current medications  Hyponatremia:  -Urine osmolarity, urine Sodium < 20, FENA < 1%.  -Improved with IV fluids -Patient encouraged to increase by mouth intake and to by herself well hydrated   Procedures:  EGD 08/01/2013 (no masses)  Bronchoscopy with biopsy 08/04/13 (results demonstrating a small cell lung cancer)  Consultations:  GI  PCCM  Discharge Exam: Filed Vitals:   08/06/13 1353  BP: 125/63  Pulse: 75  Temp:   Resp:    General: Alert, awake, oriented x3, in no acute distress. cachectic  HEENT: No bruits, no goiter; no JVD Heart: Regular rate; S1 and S2; no rubs or gallops Lungs: Good air  movement, no crackles Abdomen: Soft, nondistended, positive bowel sounds.    Discharge Instructions You were cared for by a hospitalist during your hospital stay. If you have any questions about your discharge medications or the care you received while you were in the hospital after you are discharged, you can call the unit and asked to speak with the hospitalist on call if the hospitalist that took care of you is not available. Once you are discharged, your primary care physician will handle any further medical issues. Please note that NO REFILLS for any discharge medications will be authorized once you are discharged, as it is imperative that you return to your primary care physician (or establish a relationship with a primary care physician if you do not have one) for your aftercare needs so that they can reassess your need for medications and monitor your lab values.  Discharge Instructions   Diet - low sodium heart healthy    Complete by:  As directed      Discharge instructions    Complete by:  As directed   Take medications as prescribed. Please follow as instructed in order to have a PET scan and then follow with oncology service (Dr. Earlie Server) for further decision/treatment for your lung malignancy Stop smoking            Medication List    STOP taking these medications       ibuprofen 100 MG chewable tablet  Commonly known as:  ADVIL,MOTRIN     simvastatin 40 MG tablet  Commonly known as:  ZOCOR      TAKE these medications       atorvastatin 20 MG tablet  Commonly known as:  LIPITOR  Take 1 tablet (20 mg total) by mouth at bedtime.     diltiazem 60 MG tablet  Commonly known as:  CARDIZEM  Take 1 tablet (60 mg total) by mouth every 8 (eight) hours.     DSS 100 MG Caps  Take 200 mg by mouth at bedtime.     EPINEPHrine 0.3 mg/0.3 mL Soaj injection  Commonly known as:  EPI-PEN  Inject 0.3 mLs (0.3 mg total) into the muscle once.     feeding supplement (ENSURE  COMPLETE) Liqd  Take 237 mLs by mouth 3 (three) times daily between meals.     guaiFENesin-dextromethorphan 100-10 MG/5ML syrup  Commonly known as:  ROBITUSSIN DM  Take 5 mLs by mouth every 4 (four) hours as needed for cough.     Ipratropium-Albuterol 20-100 MCG/ACT Aers respimat  Commonly known as:  COMBIVENT  Inhale 1 puff into the lungs every 6 (six) hours.     levofloxacin 750 MG tablet  Commonly known as:  LEVAQUIN  Take 1 tablet (750 mg total) by mouth every other day.     levothyroxine 100 MCG tablet  Commonly known as:  SYNTHROID, LEVOTHROID  Take 100 mcg by mouth daily before breakfast.     lisinopril 10 MG tablet  Commonly known as:  PRINIVIL,ZESTRIL  Take 1 tablet (10 mg total) by mouth daily.     multivitamin tablet  Take 1 tablet by mouth daily.  nicotine 14 mg/24hr patch  Commonly known as:  NICODERM CQ - dosed in mg/24 hours  Place 1 patch (14 mg total) onto the skin daily.     polyethylene glycol packet  Commonly known as:  MIRALAX / GLYCOLAX  Take 17 g by mouth daily as needed for moderate constipation.     traMADol 50 MG tablet  Commonly known as:  ULTRAM  Take 1 tablet (50 mg total) by mouth every 6 (six) hours as needed.       Allergies  Allergen Reactions  . Black Pepper [[place]] Anaphylaxis  . Tomato Anaphylaxis    Has epi pen       Follow-up Information   Follow up with Rigoberto Noel., MD On 09/01/2013. (at 11:00am)    Specialty:  Pulmonary Disease   Contact information:   [address] [place] [address] [phone]       The results of significant diagnostics from this hospitalization (including imaging, microbiology, ancillary and laboratory) are listed below for reference.    Significant Diagnostic Studies: Dg Chest 2 View  07/09/2013   CLINICAL DATA:  Cough  EXAM: CHEST  2 VIEW  COMPARISON:  10/28/2011  FINDINGS: Cardiomediastinal silhouette is stable. There is nodular consolidation in left lower lobe measures 3.7 cm. This  is best seen on lateral view. There is superimposed left breast density on frontal view. Further correlation with enhanced CT scan is recommended to exclude malignancy. There is mild hilar prominence bilaterally. Adenopathy cannot be excluded.  IMPRESSION: There is nodular consolidation in left lower lobe measures 3.7 cm. This is best seen on lateral view. There is superimposed left breast density on frontal view. Further correlation with enhanced CT scan is recommended to exclude malignancy. There is mild hilar prominence bilaterally. Adenopathy cannot be excluded.  These results were called by telephone at the time of interpretation on 07/09/2013 at 11:39 AM to Dr. Dena Billet , who verbally acknowledged these results.   Electronically Signed   By: Lahoma Crocker M.D.   On: 07/09/2013 11:39   Ct Chest W Contrast  07/11/2013   CLINICAL DATA:  40 pound unexplained weight loss. Productive cough with abnormal chest radiograph.  EXAM: CT CHEST, ABDOMEN, AND PELVIS WITH CONTRAST  TECHNIQUE: Multidetector CT imaging of the chest, abdomen and pelvis was performed following the standard protocol during bolus administration of intravenous contrast.  CONTRAST:  117mL OMNIPAQUE IOHEXOL 300 MG/ML  SOLN  COMPARISON:  Chest radiograph 07/09/2013.  FINDINGS: CT CHEST FINDINGS  Mediastinal adenopathy measures up to 2.5 x 3.3 cm in the lower right paratracheal station. Subcarinal adenopathy measures 3.0 x 6.2 cm. Bi hilar adenopathy measures up to 3.6 x 4.3 cm on the left. No axillary adenopathy. Atherosclerotic calcification of the arterial vasculature, including coronary arteries. Heart is enlarged. No pericardial effusion.  There is significant respiratory motion at the lung bases, limiting evaluation. A 2.5 x 4.0 cm mass is seen in the lingula (image 52). A spiculated nodule in the apical segment right upper lobe measures 0.5 x 1.2 cm (image 14). Centrilobular emphysema. Probable mild postobstructive ground-glass in the lingula and  left lower lobe. No pleural fluid. Airway is otherwise unremarkable.  CT ABDOMEN AND PELVIS FINDINGS  Image quality is degraded by motion. Liver is unremarkable. There is intrahepatic and extrahepatic biliary duct dilatation with the extrahepatic duct duct measuring up to 9 mm. Gallbladder, adrenal glands, kidneys, spleen and pancreas are unremarkable.  There is masslike thickening in the proximal stomach, measuring approximately 3.4  x 5.1 cm (series 6, image 18). Stomach, small bowel and colon are otherwise grossly unremarkable. Bladder is grossly unremarkable. Ventral hernia contains fat. Atherosclerotic calcification of the arterial vasculature with the infrarenal aorta measuring up to 3.2 cm. There is significant streak artifact in the anatomic pelvis related to a right hip arthroplasty. No worrisome lytic or sclerotic lesions. Degenerative changes are seen in the spine. 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  IMPRESSION: 1. Image quality is markedly degraded by respiratory and patient motion. 2. Bulky mediastinal and bi hilar adenopathy with a spiculated right upper lobe nodule and lingular mass. 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 3. Suspect masslike thickening in the proximal stomach. Endoscopic evaluation may be helpful, as clinically indicated. 4. Intrahepatic and extrahepatic biliary duct dilatation without definite cause. 5. Coronary artery calcification. 6. Midline ventral hernia contains fat. 7. Small infrarenal aortic aneurysm. 8. 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   Electronically Signed   By: Lorin Picket M.D.   On: 07/11/2013 16:42   Ct Angio Chest Pe W/cm &/or Wo Cm  07/30/2013   CLINICAL DATA:  Shortness of breath and cough.  Atrial flutter.  EXAM: CT ANGIOGRAPHY CHEST WITH CONTRAST  TECHNIQUE: Multidetector CT imaging of the chest was performed using the standard protocol during bolus  administration of intravenous contrast. Multiplanar CT image reconstructions and MIPs were obtained to evaluate the vascular anatomy.  CONTRAST:  146mL OMNIPAQUE IOHEXOL 350 MG/ML SOLN  COMPARISON:  Chest x-ray 07/30/2013 CT scan dated 07/11/2013  FINDINGS: There are no definitive pulmonary emboli. There has been marked progression of mediastinal tumor as well as the hilar tumor masses as well as the tumor in the lingula. There is extensive spread of tumor into the left lower lobe. Tumor encases and occludes peripheral arteries in the left lower lobe. There is now occlusion of the right middle lobe bronchus and of the left lower lobe bronchus with almost complete occlusion of the left upper lobe bronchus and with marked narrowing of the right lower lobe bronchus.  There is new hazy infiltrate in the inferior aspect of the right upper lobe. There is new loculated pleural fluid posterior to the left upper lobe.  The tumor compresses the pulmonary arteries bilaterally. The tumor extends posterior to the left atrium and has a mass effect upon the left atrium.  No acute osseous abnormalities.  Review of the MIP images confirms the above findings.  IMPRESSION: 1. Marked progression of mediastinal and hilar tumor with with new extensive tumor extension into the left lower lobe and enlargement of the mass in the lingula. The small irregular mass in the right upper lobe is unchanged.  2. New obstruction of the left lower lobe bronchus, near obstruction of the left upper lobe bronchus, complete obstruction of the right middle lobe bronchus, and severe narrowing of the right lower lobe bronchus.   Electronically Signed   By: Rozetta Nunnery M.D.   On: 07/30/2013 20:46   Ct Abdomen Pelvis W Contrast  07/11/2013   CLINICAL DATA:  40 pound unexplained weight loss. Productive cough with abnormal chest radiograph.  EXAM: CT CHEST, ABDOMEN, AND PELVIS WITH CONTRAST  TECHNIQUE: Multidetector CT imaging of the chest, abdomen and pelvis  was performed following the standard protocol during bolus administration of intravenous contrast.  CONTRAST:  171mL OMNIPAQUE IOHEXOL 300 MG/ML  SOLN  COMPARISON:  Chest radiograph 07/09/2013.  FINDINGS: CT CHEST FINDINGS  Mediastinal adenopathy  measures up to 2.5 x 3.3 cm in the lower right paratracheal station. Subcarinal adenopathy measures 3.0 x 6.2 cm. Bi hilar adenopathy measures up to 3.6 x 4.3 cm on the left. No axillary adenopathy. Atherosclerotic calcification of the arterial vasculature, including coronary arteries. Heart is enlarged. No pericardial effusion.  There is significant respiratory motion at the lung bases, limiting evaluation. A 2.5 x 4.0 cm mass is seen in the lingula (image 52). A spiculated nodule in the apical segment right upper lobe measures 0.5 x 1.2 cm (image 14). Centrilobular emphysema. Probable mild postobstructive ground-glass in the lingula and left lower lobe. No pleural fluid. Airway is otherwise unremarkable.  CT ABDOMEN AND PELVIS FINDINGS  Image quality is degraded by motion. Liver is unremarkable. There is intrahepatic and extrahepatic biliary duct dilatation with the extrahepatic duct duct measuring up to 9 mm. Gallbladder, adrenal glands, kidneys, spleen and pancreas are unremarkable.  There is masslike thickening in the proximal stomach, measuring approximately 3.4 x 5.1 cm (series 6, image 18). Stomach, small bowel and colon are otherwise grossly unremarkable. Bladder is grossly unremarkable. Ventral hernia contains fat. Atherosclerotic calcification of the arterial vasculature with the infrarenal aorta measuring up to 3.2 cm. There is significant streak artifact in the anatomic pelvis related to a right hip arthroplasty. No worrisome lytic or sclerotic lesions. Degenerative changes are seen in the spine. 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  IMPRESSION: 1. Image quality is markedly degraded by respiratory and patient motion. 2. Bulky mediastinal and  bi hilar adenopathy with a spiculated right upper lobe nodule and lingular mass. 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 3. Suspect masslike thickening in the proximal stomach. Endoscopic evaluation may be helpful, as clinically indicated. 4. Intrahepatic and extrahepatic biliary duct dilatation without definite cause. 5. Coronary artery calcification. 6. Midline ventral hernia contains fat. 7. Small infrarenal aortic aneurysm. 8. 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   Electronically Signed   By: Lorin Picket M.D.   On: 07/11/2013 16:42   Dg Chest Port 1 View  07/30/2013   CLINICAL DATA:  Cough, wheezing  EXAM: PORTABLE CHEST - 1 VIEW  COMPARISON:  07/11/2013 and earlier studies  FINDINGS: Bilateral hilar adenopathy. Progressive airspace consolidation in the basilar segments left lower lobe. New interstitial opacities around the left hilum and in the right infrahilar region. Probable left pleural effusion. . Atheromatous aorta.  Heart size upper limits normal. Degenerative changes in bilateral shoulders.  IMPRESSION: 1. Progressive consolidation/atelectasis in the left lower lung, with probable small effusion. 2. Persistent bilateral hilar adenopathy.   Electronically Signed   By: Arne Cleveland M.D.   On: 07/30/2013 18:43   Dg C-arm Bronchoscopy  08/04/2013   CLINICAL DATA: bronch procedure   C-ARM BRONCHOSCOPY  Fluoroscopy was utilized by the requesting physician.  No radiographic  interpretation.     Microbiology: Recent Results (from the past 240 hour(s))  CULTURE, BLOOD (ROUTINE X 2)     Status: None   Collection Time    07/30/13  7:24 PM      Result Value Ref Range Status   Specimen Description BLOOD RIGHT FOREARM   Final   Special Requests BOTTLES DRAWN AEROBIC ONLY 5CC   Final   Culture  Setup Time     Final   Value: 07/31/2013 00:43     Performed at [facility]   Culture     Final   Value: NO GROWTH  5  DAYS     Performed at [facility]   Report Status 08/06/2013 FINAL   Final  CULTURE, BLOOD (ROUTINE X 2)     Status: None   Collection Time    07/30/13  7:34 PM      Result Value Ref Range Status   Specimen Description BLOOD RIGHT FOREARM   Final   Special Requests BOTTLES DRAWN AEROBIC AND ANAEROBIC 5CC   Final   Culture  Setup Time     Final   Value: 07/31/2013 00:43     Performed at [facility]   Culture     Final   Value: NO GROWTH 5 DAYS     Performed at [facility]   Report Status 08/06/2013 FINAL   Final  MRSA PCR SCREENING     Status: None   Collection Time    07/30/13  9:59 PM      Result Value Ref Range Status   MRSA by PCR NEGATIVE  NEGATIVE Final   Comment:            The GeneXpert MRSA Assay (FDA     approved for NASAL specimens     only), is one component of a     comprehensive MRSA colonization     surveillance program. It is not     intended to diagnose MRSA     infection nor to guide or     monitor treatment for     MRSA infections.  CULTURE, BLOOD (ROUTINE X 2)     Status: None   Collection Time    07/30/13 10:00 PM      Result Value Ref Range Status   Specimen Description BLOOD RIGHT HAND   Final   Special Requests BOTTLES DRAWN AEROBIC ONLY 5CC   Final   Culture  Setup Time     Final   Value: 07/31/2013 03:54     Performed at [facility]   Culture     Final   Value: NO GROWTH 5 DAYS     Performed at [facility]   Report Status 08/06/2013 FINAL   Final  CULTURE, BLOOD (ROUTINE X 2)     Status: None   Collection Time    07/30/13 10:09 PM      Result Value Ref Range Status   Specimen Description BLOOD LEFT HAND   Final   Special Requests BOTTLES DRAWN AEROBIC ONLY 1.5CC   Final   Culture  Setup Time     Final   Value: 07/31/2013 03:54     Performed at [facility]   Culture     Final   Value: NO GROWTH 5 DAYS     Performed at [facility]   Report Status 08/06/2013 FINAL   Final   CULTURE, EXPECTORATED SPUTUM-ASSESSMENT     Status: None   Collection Time    07/31/13  9:06 AM      Result Value Ref Range Status   Specimen Description SPUTUM   Final   Special Requests NONE   Final   Sputum evaluation     Final   Value: MICROSCOPIC FINDINGS SUGGEST THAT THIS SPECIMEN IS NOT REPRESENTATIVE OF LOWER RESPIRATORY SECRETIONS. PLEASE RECOLLECT.     Gram Stain Report Called to,Read Back By and Verified With: Darryll Capers RN 11:45 07/31/13 ([email or web address])   Report Status 07/31/2013 FINAL   Final     Labs: Basic Metabolic Panel:  Recent Labs Lab 07/30/13 1642  07/31/13 0401 08/01/13 1235 08/02/13 0303 08/03/13 0403 08/04/13 0522  NA 135*  --  128* 134* 137 134* 135*  K 4.6  --  3.7 4.1 4.4 4.2 4.3  CL 94*  --  95* 96 98 95* 96  CO2 25  --  20 25 26 26 27   GLUCOSE 108*  --  434* 251* 137* 105* 87  BUN 16  --  11 15 17 19 11   CREATININE 0.84  < > 0.56 0.58 0.59 0.56 0.50  CALCIUM 11.2*  --  7.9* 9.9 9.9 9.9 9.3  MG 1.6  --   --   --   --   --   --   < > = values in this interval not displayed.  CBC:  Recent Labs Lab 07/30/13 1642 07/30/13 2200 07/31/13 0401  WBC 14.1* 12.7* 12.6*  NEUTROABS 11.3*  --  11.8*  HGB 14.4 12.1 12.0  HCT 41.3 36.5 35.8*  MCV 97.9 98.9 97.8  PLT 422* 384 376   BNP: BNP (last 3 results)  Recent Labs  07/30/13 1644  PROBNP 2688.0*   CBG:  Recent Labs Lab 07/30/13 2141  GLUCAP 128*    Signed:  Barton Dubois  [facility] 08/06/2013, 3:44 PM

## 2013-08-06 NOTE — Progress Notes (Signed)
The patient received Ibuprofen once overnight for chronic right hip pain.  She became SOB this morning and had some wheezing, but declined a PRN breathing treatment and oxygen for comfort.  Her O2 sats were 99% on RA.  Her VS are stable.

## 2013-08-06 NOTE — Progress Notes (Signed)
DC IV, DC Tele, DC Home. Discharge instructions and home medications discussed with patient and patient's daughter. Patient and daughter denied any questions or concerns at this time. Patient leaving unit via wheelchair and appears in no acute distress. 

## 2013-08-06 NOTE — Progress Notes (Signed)
Pt refused breathing treatment at this time. Family at bedside no distress noted

## 2013-08-06 NOTE — Progress Notes (Signed)
Physical Therapy Treatment Patient Details Name: Emma Douglas MRN: [id number] DOB: 1941-01-25 Today's Date: 08/06/2013    History of Present Illness Pt is a 73 y/o female admitted s/p a visit to her PCP for a cough and HR was 160 in office.  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    PT Comments    Pt progressing towards physical therapy goals. Is self-limiting when family member is present, however can perform transfers and mobility without assistance if cued to do so. Pt on RA throughout session and with pursed-lip breathing is able to maintain sats at 90% at rest and while seated performing ther-ex. Pt and daughter anticipate d/c this afternoon. Will continue to progress per POC.   Follow Up Recommendations  Home health PT;Supervision for mobility/OOB     Equipment Recommendations  Rolling walker with 5" wheels;3in1 (PT);Other (comment)    Recommendations for Other Services       Precautions / Restrictions Precautions Precautions: Fall Restrictions Weight Bearing Restrictions: No    Mobility  Bed Mobility Overal bed mobility: Needs Assistance Bed Mobility: Supine to Sit;Sit to Supine     Supine to sit: Supervision;HOB elevated Sit to supine: Min guard   General bed mobility comments: Pt reaching for therapist and asking daughter to come help her. PT encouraged pt to do as much on her own as she could. With VC's for technique pt able to perform transfer to/from EOB without assist.   Transfers Overall transfer level: Needs assistance Equipment used: Rolling walker (2 wheeled) Transfers: Sit to/from Stand Sit to Stand: Min guard         General transfer comment: VC's for hand placement on seated surface for safety.   Ambulation/Gait Ambulation/Gait assistance: Min guard Ambulation Distance (Feet): 75 Feet Assistive device: Rolling walker (2  wheeled) Gait Pattern/deviations: Step-through pattern;Decreased stride length;Trunk flexed Gait velocity: Decreased Gait velocity interpretation: Below normal speed for age/gender General Gait Details: Frequent VC's for pursed-lip breathing. Gets upset when therapist corrects her and feels as if her way is "good enough". O2 sats remaining in mid-80's on RA during ambulation. Increases to 90% with seated rest break.    Stairs            Wheelchair Mobility    Modified Rankin (Stroke Patients Only)       Balance Overall balance assessment: Needs assistance Sitting-balance support: Feet supported;No upper extremity supported Sitting balance-Leahy Scale: Fair     Standing balance support: No upper extremity supported Standing balance-Leahy Scale: Fair                      Cognition Arousal/Alertness: Awake/alert Behavior During Therapy: WFL for tasks assessed/performed Overall Cognitive Status: Within Functional Limits for tasks assessed                      Exercises General Exercises - Lower Extremity Long Arc Quad: 15 reps    General Comments        Pertinent Vitals/Pain See above for O2 sats    Home Living                      Prior Function            PT Goals (current goals can now be found in the care plan section) Acute Rehab PT Goals Patient Stated Goal: To  return home PT Goal Formulation: With patient/family Time For Goal Achievement: 08/14/13 Potential to Achieve Goals: Good Progress towards PT goals: Progressing toward goals    Frequency  Min 3X/week    PT Plan Current plan remains appropriate    Co-evaluation             End of Session Equipment Utilized During Treatment: Gait belt Activity Tolerance: Patient limited by fatigue Patient left: in bed;with bed alarm set;with call bell/phone within reach;with family/visitor present     Time: 9136-8599 PT Time Calculation (min): 19 min  Charges:  $Gait  Training: 8-22 mins                    G Codes:      Jolyn Lent August 27, 2013, 5:13 PM  Jolyn Lent, PT, DPT Acute Rehabilitation Services Pager: [phone]

## 2013-08-08 ENCOUNTER — Encounter (HOSPITAL_COMMUNITY)
Admission: RE | Admit: 2013-08-08 | Discharge: 2013-08-08 | Disposition: A | Discharge status: Home / Self Care | Payer: Medicare Other | Source: Ambulatory Visit | Attending: Internal Medicine | Admitting: Internal Medicine

## 2013-08-08 ENCOUNTER — Ambulatory Visit (HOSPITAL_COMMUNITY)
Admission: RE | Admit: 2013-08-08 | Discharge: 2013-08-08 | Disposition: A | Discharge status: Home / Self Care | Payer: Medicare Other | Source: Ambulatory Visit | Attending: Internal Medicine | Admitting: Internal Medicine

## 2013-08-08 ENCOUNTER — Other Ambulatory Visit: Payer: Self-pay | Admitting: Internal Medicine

## 2013-08-08 ENCOUNTER — Encounter (HOSPITAL_COMMUNITY): Payer: Self-pay

## 2013-08-08 DIAGNOSIS — K3189 Other diseases of stomach and duodenum: Secondary | ICD-10-CM

## 2013-08-08 DIAGNOSIS — K319 Disease of stomach and duodenum, unspecified: Secondary | ICD-10-CM | POA: Insufficient documentation

## 2013-08-08 DIAGNOSIS — C7952 Secondary malignant neoplasm of bone marrow: Secondary | ICD-10-CM | POA: Diagnosis not present

## 2013-08-08 DIAGNOSIS — R918 Other nonspecific abnormal finding of lung field: Secondary | ICD-10-CM

## 2013-08-08 DIAGNOSIS — C7951 Secondary malignant neoplasm of bone: Secondary | ICD-10-CM | POA: Insufficient documentation

## 2013-08-08 DIAGNOSIS — G319 Degenerative disease of nervous system, unspecified: Secondary | ICD-10-CM | POA: Diagnosis not present

## 2013-08-08 DIAGNOSIS — R222 Localized swelling, mass and lump, trunk: Secondary | ICD-10-CM | POA: Insufficient documentation

## 2013-08-08 DIAGNOSIS — R599 Enlarged lymph nodes, unspecified: Secondary | ICD-10-CM | POA: Insufficient documentation

## 2013-08-08 DIAGNOSIS — R911 Solitary pulmonary nodule: Secondary | ICD-10-CM | POA: Insufficient documentation

## 2013-08-08 LAB — GLUCOSE, CAPILLARY: Glucose-Capillary: 115 mg/dL — ABNORMAL HIGH (ref 70–99)

## 2013-08-08 MED ORDER — FLUDEOXYGLUCOSE F - 18 (FDG) INJECTION
8.6000 | Freq: Once | INTRAVENOUS | Status: AC | PRN
  Administered 2013-08-08: 8.6 via INTRAVENOUS

## 2013-08-11 ENCOUNTER — Ambulatory Visit (INDEPENDENT_AMBULATORY_CARE_PROVIDER_SITE_OTHER): Payer: Medicare Other | Admitting: Physician Assistant

## 2013-08-11 ENCOUNTER — Encounter: Payer: Self-pay | Admitting: Physician Assistant

## 2013-08-11 VITALS — BP 122/62 | HR 88 | Temp 98.2°F | Resp 20 | Wt 103.5 lb

## 2013-08-11 DIAGNOSIS — C3490 Malignant neoplasm of unspecified part of unspecified bronchus or lung: Secondary | ICD-10-CM

## 2013-08-11 DIAGNOSIS — I4892 Unspecified atrial flutter: Secondary | ICD-10-CM

## 2013-08-11 DIAGNOSIS — K319 Disease of stomach and duodenum, unspecified: Secondary | ICD-10-CM

## 2013-08-11 DIAGNOSIS — R590 Localized enlarged lymph nodes: Secondary | ICD-10-CM

## 2013-08-11 DIAGNOSIS — R599 Enlarged lymph nodes, unspecified: Secondary | ICD-10-CM

## 2013-08-11 DIAGNOSIS — K3189 Other diseases of stomach and duodenum: Secondary | ICD-10-CM

## 2013-08-11 DIAGNOSIS — R634 Abnormal weight loss: Secondary | ICD-10-CM

## 2013-08-11 DIAGNOSIS — J441 Chronic obstructive pulmonary disease with (acute) exacerbation: Secondary | ICD-10-CM

## 2013-08-11 NOTE — Progress Notes (Signed)
Patient ID: Emma Douglas MRN: [id number], DOB: 04/03/1940, 73 y.o. Date of Encounter: 08/11/2013, 10:08 AM    Chief Complaint:  Chief Complaint  Patient presents with  . 4 week visit    just got out hosp     HPI: 73 y.o. year old female with her daughter who has been here with her for recent office visits. Patient's last office visit here she was found to be in a flutter with rapid ventricular response. Was sent directly to the hospital. She was admitted to the hospital for multiple days. Discharge summary is not in the computer yet. Patient's daughter reports that she is not aware of anything specific that I am to followup/recheck today. Asked if patient has seen any other medical provider since her hospital discharge. Daughter's response is that they went to Dr. Lew Dawes office on Friday 08/08/13 for radiation treatment and MRI. It is that they get back to Dr. Earlie Server again this Friday. As far as the daughter knows at this time, No other appointments with pulmonology her cardiology etc. have been scheduled.  In our computer, reason for today's visit was " four-week followup ".  Pt and daughter have no specific issues that they feel need to be addressed by me today.     Home Meds:   Outpatient Prescriptions Prior to Visit  Medication Sig Dispense Refill  . atorvastatin (LIPITOR) 20 MG tablet Take 1 tablet (20 mg total) by mouth at bedtime.  30 tablet  1  . diltiazem (CARDIZEM) 60 MG tablet Take 1 tablet (60 mg total) by mouth every 8 (eight) hours.  90 tablet  1  . docusate sodium 100 MG CAPS Take 200 mg by mouth at bedtime.  10 capsule  0  . EPINEPHrine (EPI-PEN) 0.3 mg/0.3 mL SOAJ injection Inject 0.3 mLs (0.3 mg total) into the muscle once.  1 Device  2  . feeding supplement, ENSURE COMPLETE, (ENSURE COMPLETE) LIQD Take 237 mLs by mouth 3 (three) times daily between meals.      Marland Kitchen guaiFENesin-dextromethorphan (ROBITUSSIN DM) 100-10 MG/5ML syrup Take 5 mLs by mouth every 4  (four) hours as needed for cough.  118 mL  0  . Ipratropium-Albuterol (COMBIVENT) 20-100 MCG/ACT AERS respimat Inhale 1 puff into the lungs every 6 (six) hours.  4 g  2  . levothyroxine (SYNTHROID, LEVOTHROID) 100 MCG tablet Take 100 mcg by mouth daily before breakfast.      . lisinopril (PRINIVIL,ZESTRIL) 10 MG tablet Take 1 tablet (10 mg total) by mouth daily.  90 tablet  3  . Multiple Vitamin (MULTIVITAMIN) tablet Take 1 tablet by mouth daily.      . nicotine (NICODERM CQ - DOSED IN MG/24 HOURS) 14 mg/24hr patch Place 1 patch (14 mg total) onto the skin daily.  28 patch  0  . polyethylene glycol (MIRALAX / GLYCOLAX) packet Take 17 g by mouth daily as needed for moderate constipation.  14 each  0  . traMADol (ULTRAM) 50 MG tablet Take 1 tablet (50 mg total) by mouth every 6 (six) hours as needed.  40 tablet  0  . levofloxacin (LEVAQUIN) 750 MG tablet Take 1 tablet (750 mg total) by mouth every other day.  3 tablet  0   No facility-administered medications prior to visit.    Allergies:  Allergies  Allergen Reactions  . Black Pepper [[place]] Anaphylaxis  . Tomato Anaphylaxis    Has epi pen      Review of Systems: See HPI for  pertinent ROS. All other ROS negative.    Physical Exam: Blood pressure 122/62, pulse 88, temperature 98.2 F (36.8 C), temperature source Oral, resp. rate 20, weight 103 lb 8 oz (46.947 kg), SpO2 93.00%., Body mass index is 16.21 kg/(m^2). General:  Extremely thin African American female . Appears in no acute distress. Neck: Supple. No thyromegaly. No lymphadenopathy. Lungs: Distant, decreased breath sounds bilaterally throughout. Some rhonchi scattered. Heart: Regular rhythm. No murmurs, rubs, or gallops. Msk: very thin.  Extremities/Skin: Warm and dry. Neuro:  Moves all extremities spontaneously.  CNII-XII grossly in tact.     ASSESSMENT AND PLAN:  73 y.o. year old female with  1. Small cell lung cancer  2. Gastric mass  3. Hilar adenopathy being  evaluated by Dr. Earlie Server  4. COPD with acute exacerbation  5. Atrial flutter with rapid ventricular response  6. Unintentional weight loss  At this time I will leave her current care to Dr. Earlie Server with oncology. Will not schedule her a followup office visit here -at this time. Told her to followup here as needed if any other medical issues arise, or once this cancer is managed.   Signed, [address] [place], [place], [facility] 08/11/2013 10:08 AM

## 2013-08-15 ENCOUNTER — Other Ambulatory Visit: Payer: Self-pay | Admitting: *Deleted

## 2013-08-15 ENCOUNTER — Telehealth: Payer: Self-pay | Admitting: *Deleted

## 2013-08-15 ENCOUNTER — Other Ambulatory Visit (HOSPITAL_BASED_OUTPATIENT_CLINIC_OR_DEPARTMENT_OTHER): Payer: Medicare Other

## 2013-08-15 ENCOUNTER — Ambulatory Visit (HOSPITAL_BASED_OUTPATIENT_CLINIC_OR_DEPARTMENT_OTHER): Payer: Medicare Other | Admitting: Internal Medicine

## 2013-08-15 ENCOUNTER — Encounter: Payer: Self-pay | Admitting: Physician Assistant

## 2013-08-15 ENCOUNTER — Telehealth: Payer: Self-pay | Admitting: Internal Medicine

## 2013-08-15 ENCOUNTER — Encounter: Payer: Self-pay | Admitting: *Deleted

## 2013-08-15 VITALS — BP 100/47 | HR 64 | Temp 98.0°F | Resp 18 | Ht 67.0 in | Wt 101.7 lb

## 2013-08-15 DIAGNOSIS — R059 Cough, unspecified: Secondary | ICD-10-CM

## 2013-08-15 DIAGNOSIS — C787 Secondary malignant neoplasm of liver and intrahepatic bile duct: Secondary | ICD-10-CM

## 2013-08-15 DIAGNOSIS — F17200 Nicotine dependence, unspecified, uncomplicated: Secondary | ICD-10-CM

## 2013-08-15 DIAGNOSIS — C7952 Secondary malignant neoplasm of bone marrow: Secondary | ICD-10-CM

## 2013-08-15 DIAGNOSIS — C3490 Malignant neoplasm of unspecified part of unspecified bronchus or lung: Secondary | ICD-10-CM

## 2013-08-15 DIAGNOSIS — C7951 Secondary malignant neoplasm of bone: Secondary | ICD-10-CM

## 2013-08-15 DIAGNOSIS — K3189 Other diseases of stomach and duodenum: Secondary | ICD-10-CM

## 2013-08-15 DIAGNOSIS — R918 Other nonspecific abnormal finding of lung field: Secondary | ICD-10-CM

## 2013-08-15 DIAGNOSIS — R599 Enlarged lymph nodes, unspecified: Secondary | ICD-10-CM

## 2013-08-15 DIAGNOSIS — R05 Cough: Secondary | ICD-10-CM

## 2013-08-15 DIAGNOSIS — C3430 Malignant neoplasm of lower lobe, unspecified bronchus or lung: Secondary | ICD-10-CM

## 2013-08-15 DIAGNOSIS — R911 Solitary pulmonary nodule: Secondary | ICD-10-CM

## 2013-08-15 DIAGNOSIS — R0602 Shortness of breath: Secondary | ICD-10-CM

## 2013-08-15 LAB — COMPREHENSIVE METABOLIC PANEL (CC13)
ALK PHOS: 132 U/L (ref 40–150)
ALT: 12 U/L (ref 0–55)
AST: 26 U/L (ref 5–34)
Albumin: 2.4 g/dL — ABNORMAL LOW (ref 3.5–5.0)
Anion Gap: 10 mEq/L (ref 3–11)
BUN: 11.7 mg/dL (ref 7.0–26.0)
CO2: 28 meq/L (ref 22–29)
Calcium: 10.7 mg/dL — ABNORMAL HIGH (ref 8.4–10.4)
Chloride: 100 mEq/L (ref 98–109)
Creatinine: 0.8 mg/dL (ref 0.6–1.1)
Glucose: 107 mg/dl (ref 70–140)
Potassium: 4.4 mEq/L (ref 3.5–5.1)
SODIUM: 138 meq/L (ref 136–145)
TOTAL PROTEIN: 7.2 g/dL (ref 6.4–8.3)
Total Bilirubin: 0.38 mg/dL (ref 0.20–1.20)

## 2013-08-15 LAB — CBC WITH DIFFERENTIAL/PLATELET
BASO%: 0.3 % (ref 0.0–2.0)
Basophils Absolute: 0 10*3/uL (ref 0.0–0.1)
EOS%: 0.5 % (ref 0.0–7.0)
Eosinophils Absolute: 0.1 10*3/uL (ref 0.0–0.5)
HEMATOCRIT: 39.6 % (ref 34.8–46.6)
HGB: 12.9 g/dL (ref 11.6–15.9)
LYMPH%: 12.1 % — AB (ref 14.0–49.7)
MCH: 31.9 pg (ref 25.1–34.0)
MCHC: 32.6 g/dL (ref 31.5–36.0)
MCV: 98 fL (ref 79.5–101.0)
MONO#: 0.6 10*3/uL (ref 0.1–0.9)
MONO%: 6.2 % (ref 0.0–14.0)
NEUT#: 8 10*3/uL — ABNORMAL HIGH (ref 1.5–6.5)
NEUT%: 80.9 % — AB (ref 38.4–76.8)
Platelets: 368 10*3/uL (ref 145–400)
RBC: 4.04 10*6/uL (ref 3.70–5.45)
RDW: 14.2 % (ref 11.2–14.5)
WBC: 9.8 10*3/uL (ref 3.9–10.3)
lymph#: 1.2 10*3/uL (ref 0.9–3.3)

## 2013-08-15 LAB — LACTATE DEHYDROGENASE: LDH: 1265 U/L — AB (ref 94–250)

## 2013-08-15 LAB — PHOSPHORUS: Phosphorus: 4.1 mg/dL (ref 2.3–4.6)

## 2013-08-15 LAB — MAGNESIUM (CC13): Magnesium: 2 mg/dl (ref 1.5–2.5)

## 2013-08-15 MED ORDER — PROCHLORPERAZINE MALEATE 10 MG PO TABS
10.0000 mg | ORAL_TABLET | Freq: Four times a day (QID) | ORAL | Status: AC | PRN
Start: 2013-08-15 — End: ?

## 2013-08-15 NOTE — Telephone Encounter (Signed)
Per staff phone call and POF I have schedueld appts. Scheduler advised of appts.  JMW  

## 2013-08-15 NOTE — Telephone Encounter (Signed)
Gave pt appt for lab,md chemo class and Injections for June and july 2015

## 2013-08-15 NOTE — Progress Notes (Signed)
[place] Telephone:([phone]   Fax:([phone]) [phone]  OFFICE PROGRESS NOTE  Karis Juba, PA-C [address] [place] [address]  DIAGNOSIS: Extensive stage small cell lung cancer diagnosed in June of 2015.  PRIOR THERAPY: None  CURRENT THERAPY: She is currently evaluated for the enrollment in the clinical trial according to the ECoG E. 2511 with the patient would be treated with cisplatin and etoposide plus/minus Veliparib.   INTERVAL HISTORY: Emma Douglas 73 y.o. female returns to the clinic today for followup visit accompanied by her daughter. The patient is feeling fine today except for the persistent shortness of breath and cough. She denied having any significant chest pain or hemoptysis. She has a few more pounds of weight loss. She denied having any significant fever or chills, no nausea or vomiting. She recently underwent several studies including MRI of the brain as well as PET scan. She also has bronchoscopy under the care of Dr. Joya Gaskins which confirms the diagnosis of small cell carcinoma. She is here today for evaluation and discussion of her treatment options.   MEDICAL HISTORY: Past Medical History  Diagnosis Date  . Compensated hypothyroidism   . Essential hypertension   . Dyslipidemia   . Colon polyps   . Anxiety   . COPD (chronic obstructive pulmonary disease)     secondary to smoking.  . Avascular necrosis     right femoral head  . Hyperlipidemia   . Anemia      Postoperative hemorrhagic anemia  . Allergic reaction 10/24/2012  . Arthritis     "hands" (07/31/2013)  . Lung mass     /CT 07/11/2013  . Mass of stomach     /CT 07/11/2013  . CAP (community acquired pneumonia) 07/30/2013    "first time I've ever had pneumonia"  . Atrial flutter with rapid ventricular response     Archie Endo 07/31/2013  . Subdural hemorrhage following injury 2013    fall/notes 07/30/2013    ALLERGIES:  is allergic to black pepper and  tomato.  MEDICATIONS:  Current Outpatient Prescriptions  Medication Sig Dispense Refill  . atorvastatin (LIPITOR) 20 MG tablet Take 1 tablet (20 mg total) by mouth at bedtime.  30 tablet  1  . diltiazem (CARDIZEM) 60 MG tablet Take 1 tablet (60 mg total) by mouth every 8 (eight) hours.  90 tablet  1  . docusate sodium 100 MG CAPS Take 200 mg by mouth at bedtime.  10 capsule  0  . feeding supplement, ENSURE COMPLETE, (ENSURE COMPLETE) LIQD Take 237 mLs by mouth 3 (three) times daily between meals.      Marland Kitchen guaiFENesin-dextromethorphan (ROBITUSSIN DM) 100-10 MG/5ML syrup Take 5 mLs by mouth every 4 (four) hours as needed for cough.  118 mL  0  . Ipratropium-Albuterol (COMBIVENT) 20-100 MCG/ACT AERS respimat Inhale 1 puff into the lungs every 6 (six) hours.  4 g  2  . levofloxacin (LEVAQUIN) 750 MG tablet Take 1 tablet (750 mg total) by mouth every other day.  3 tablet  0  . levothyroxine (SYNTHROID, LEVOTHROID) 100 MCG tablet Take 100 mcg by mouth daily before breakfast.      . lisinopril (PRINIVIL,ZESTRIL) 10 MG tablet Take 1 tablet (10 mg total) by mouth daily.  90 tablet  3  . Multiple Vitamin (MULTIVITAMIN) tablet Take 1 tablet by mouth daily.      . nicotine (NICODERM CQ - DOSED IN MG/24 HOURS) 14 mg/24hr patch Place 1 patch (  14 mg total) onto the skin daily.  28 patch  0  . polyethylene glycol (MIRALAX / GLYCOLAX) packet Take 17 g by mouth daily as needed for moderate constipation.  14 each  0  . traMADol (ULTRAM) 50 MG tablet Take 1 tablet (50 mg total) by mouth every 6 (six) hours as needed.  40 tablet  0  . EPINEPHrine (EPI-PEN) 0.3 mg/0.3 mL SOAJ injection Inject 0.3 mLs (0.3 mg total) into the muscle once.  1 Device  2   No current facility-administered medications for this visit.    SURGICAL HISTORY:  Past Surgical History  Procedure Laterality Date  . Colonoscopy      with polypectomy and biopsy  . Total hip arthroplasty Right 2002  . Dilation and curettage of uterus    . Tubal  ligation    . Esophagogastroduodenoscopy (egd) with propofol N/A 08/01/2013    Procedure: ESOPHAGOGASTRODUODENOSCOPY (EGD) WITH PROPOFOL;  Surgeon: Jeryl Columbia, MD;  Location: [facility] END[facility]COPY;  Service: Endoscopy;  Laterality: N/A;  . Video bronchoscopy Bilateral 08/04/2013    Procedure: VIDEO BRONCHOSCOPY WITH FLUORO;  Surgeon: Elsie Stain, MD;  Location: [place];  Service: Cardiopulmonary;  Laterality: Bilateral;  . Video bronchoscopy N/A 08/04/2013    Procedure: VIDEO BRONCHOSCOPY WITH FLUORO;  Surgeon: Elsie Stain, MD;  Location: [place];  Service: Cardiopulmonary;  Laterality: N/A;    REVIEW OF SYSTEMS:  Constitutional: positive for anorexia, fatigue and weight loss Eyes: negative Ears, nose, mouth, throat, and face: negative Respiratory: positive for cough and dyspnea on exertion Cardiovascular: negative Gastrointestinal: negative Genitourinary:negative Integument/breast: negative Hematologic/lymphatic: negative Musculoskeletal:negative Neurological: negative Behavioral/Psych: negative Endocrine: negative Allergic/Immunologic: negative   PHYSICAL EXAMINATION: General appearance: alert, cooperative, fatigued and no distress Head: Normocephalic, without obvious abnormality, atraumatic Neck: no adenopathy, no JVD, supple, symmetrical, trachea midline and thyroid not enlarged, symmetric, no tenderness/mass/nodules Lymph nodes: Cervical, supraclavicular, and axillary nodes normal. Resp: clear to auscultation bilaterally Back: symmetric, no curvature. ROM normal. No CVA tenderness. Cardio: regular rate and rhythm, S1, S2 normal, no murmur, click, rub or gallop GI: soft, non-tender; bowel sounds normal; no masses,  no organomegaly Extremities: extremities normal, atraumatic, no cyanosis or edema Neurologic: Alert and oriented X 3, normal strength and tone. Normal symmetric reflexes. Normal coordination and gait  ECOG PERFORMANCE STATUS: 1 - Symptomatic but completely  ambulatory  Blood pressure 100/47, pulse 64, temperature 98 F (36.7 C), temperature source Oral, resp. rate 18, height 5\' 7"  (1.702 m), weight 101 lb 11.2 oz (46.131 kg), SpO2 100.00%.  LABORATORY DATA: Lab Results  Component Value Date   WBC 9.8 08/15/2013   HGB 12.9 08/15/2013   HCT 39.6 08/15/2013   MCV 98.0 08/15/2013   PLT 368 08/15/2013      Chemistry      Component Value Date/Time   NA 135* 08/04/2013 0522   NA 134* 07/29/2013 1111   K 4.3 08/04/2013 0522   K 3.8 07/29/2013 1111   CL 96 08/04/2013 0522   CO2 27 08/04/2013 0522   CO2 25 07/29/2013 1111   BUN 11 08/04/2013 0522   BUN 12.0 07/29/2013 1111   CREATININE 0.50 08/04/2013 0522   CREATININE 0.8 07/29/2013 1111   CREATININE 0.83 07/02/2013 1246      Component Value Date/Time   CALCIUM 9.3 08/04/2013 0522   CALCIUM 10.3 07/29/2013 1111   ALKPHOS 102 07/29/2013 1111   ALKPHOS 126* 07/02/2013 1246   AST 22 07/29/2013 1111   AST 24 07/02/2013 1246   ALT  10 07/29/2013 1111   ALT 20 07/02/2013 1246   BILITOT 0.62 07/29/2013 1111   BILITOT 0.3 07/02/2013 1246       RADIOGRAPHIC STUDIES: Ct Angio Chest Pe W/cm &/or Wo Cm  07/30/2013   CLINICAL DATA:  Shortness of breath and cough.  Atrial flutter.  EXAM: CT ANGIOGRAPHY CHEST WITH CONTRAST  TECHNIQUE: Multidetector CT imaging of the chest was performed using the standard protocol during bolus administration of intravenous contrast. Multiplanar CT image reconstructions and MIPs were obtained to evaluate the vascular anatomy.  CONTRAST:  155mL OMNIPAQUE IOHEXOL 350 MG/ML SOLN  COMPARISON:  Chest x-ray 07/30/2013 CT scan dated 07/11/2013  FINDINGS: There are no definitive pulmonary emboli. There has been marked progression of mediastinal tumor as well as the hilar tumor masses as well as the tumor in the lingula. There is extensive spread of tumor into the left lower lobe. Tumor encases and occludes peripheral arteries in the left lower lobe. There is now occlusion of the right middle lobe bronchus  and of the left lower lobe bronchus with almost complete occlusion of the left upper lobe bronchus and with marked narrowing of the right lower lobe bronchus.  There is new hazy infiltrate in the inferior aspect of the right upper lobe. There is new loculated pleural fluid posterior to the left upper lobe.  The tumor compresses the pulmonary arteries bilaterally. The tumor extends posterior to the left atrium and has a mass effect upon the left atrium.  No acute osseous abnormalities.  Review of the MIP images confirms the above findings.  IMPRESSION: 1. Marked progression of mediastinal and hilar tumor with with new extensive tumor extension into the left lower lobe and enlargement of the mass in the lingula. The small irregular mass in the right upper lobe is unchanged.  2. New obstruction of the left lower lobe bronchus, near obstruction of the left upper lobe bronchus, complete obstruction of the right middle lobe bronchus, and severe narrowing of the right lower lobe bronchus.   Electronically Signed   By: Rozetta Nunnery M.D.   On: 07/30/2013 20:46   Mr Brain Wo Contrast  08/08/2013   CLINICAL DATA:  Lung mass. Gastric mass. Assess for metastatic disease. Patient refused contrast.  EXAM: MRI HEAD WITHOUT CONTRAST  TECHNIQUE: Multiplanar, multiecho pulse sequences of the brain and surrounding structures were obtained without intravenous contrast.  COMPARISON:  Head CT 10/29/2011  FINDINGS: As noted above, the patient would not allow administration of intravenous contrast. This limits detection of small metastatic lesions.  That said, there is no finding to suggest the presence of metastatic disease. The brainstem is normal. There is cerebellar atrophy with a few old small vessel cerebellar infarctions. Cerebral hemispheres show ordinary chronic small-vessel changes affecting the deep and subcortical white matter. In the pass, the patient had subdural hematoma is an there are very tiny frontal hygroma as. No  evidence of recurrent subdural hematoma. No hydrocephalus. No pituitary mass. No fluid in the sinuses, middle ears or mastoids. Major vessels are patent. No evidence of skull or skullbase lesion.  IMPRESSION: The patient refused contrast administration. This limits the detection of small metastatic lesions. There is no sign of large lesion. There is no finding to suggest any metastatic disease.  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 No evidence of recent bleeding.  Chronic small vessel change of the hemispheric white matter.   Electronically Signed   By: Nelson Chimes M.D.   On: 08/08/2013 17:02  Nm Pet Image Initial (pi) Skull Base To Thigh  08/08/2013   CLINICAL DATA:  Initial treatment strategy for lung mass.  EXAM: NUCLEAR MEDICINE PET SKULL BASE TO THIGH  TECHNIQUE: 8.6 mCi F-18 FDG was injected intravenously. Full-ring PET imaging was performed from the skull base to thigh after the radiotracer. CT data was obtained and used for attenuation correction and anatomic localization.  FASTING BLOOD GLUCOSE:  Value: 115 mg/dl  COMPARISON:  CT 07/11/2013  FINDINGS: NECK  There is a hypermetabolic right supraclavicular lymph node measuring 13 mm short axis (image 51, series 4) with SUV max 7.5.  CHEST  There is a hypermetabolic right lower lobe mass measuring 4.0 cm with SUV max 5.7.  The bulky hypermetabolic left hilar mass measuring 4.5 cm with SUV max equals 7.1. This obstructs the left lower lobe. There is hypermetabolic prevascular and right hilar lymph nodes. There is hypermetabolic enlarged right paratracheal nodes.  Within the right upper lobe there is a hypermetabolic nodule measuring 12 mm (image 23, series 8) which is hypermetabolic. There is hypermetabolic consolidation in the right upper ( image 32).  ABDOMEN/PELVIS  There is a hypermetabolic focus within the left hepatic lobe (image 126) which is indeterminate but suspicious. There is an enlarged hypermetabolic periportal lymph  node with SUV max 8.1.  SKELETON  There is widespread skeletal metastasis. There is intense hypermetabolic lesion in the posterior right iliac bone with associated sclerosis and with SUV max = 5.0. There 2 hypermetabolic foci within the inferior sacrum. Several foci within the lumbar spine.  IMPRESSION: 1. Hypermetabolic right supraclavicular nodal metastasis. 2. Extensive bilateral hypermetabolic nodal metastasis. Left hilar nodal conglomerate reaches up to 5 cm with collapse of the left lower lobe. 3. Hypermetabolic left lower lobe pulmonary mass. This could represent primary tumor. 4. Hypermetabolic metastasis within the right upper lobe. 5. Concern for single hypermetabolic metastasis within the left hepatic lobe. 6. Hypermetabolic periportal lymph node metastasis. 7. Hypermetabolic skeletal metastasis with the greatest density of lesions in the pelvis and lumbar spine.   Electronically Signed   By: Suzy Bouchard M.D.   On: 08/08/2013 16:28   Dg Chest Port 1 View  07/30/2013   CLINICAL DATA:  Cough, wheezing  EXAM: PORTABLE CHEST - 1 VIEW  COMPARISON:  07/11/2013 and earlier studies  FINDINGS: Bilateral hilar adenopathy. Progressive airspace consolidation in the basilar segments left lower lobe. New interstitial opacities around the left hilum and in the right infrahilar region. Probable left pleural effusion. . Atheromatous aorta.  Heart size upper limits normal. Degenerative changes in bilateral shoulders.  IMPRESSION: 1. Progressive consolidation/atelectasis in the left lower lung, with probable small effusion. 2. Persistent bilateral hilar adenopathy.   Electronically Signed   By: Arne Cleveland MD.   On: 07/30/2013 18:43   Dg C-arm Bronchoscopy  08/04/2013   CLINICAL DATA: bronch procedure   C-ARM BRONCHOSCOPY  Fluoroscopy was utilized by the requesting physician.  No radiographic  interpretation.     ASSESSMENT AND PLAN: This is a very pleasant 73 years old African American female recently  diagnosed with extensive stage small cell lung cancer presented with left lower lobe lung mass in addition to mediastinal and right supraclavicular lymphadenopathy as well as hypermetabolic right upper lobe lung nodule in addition to liver and skeletal metastasis. I had a lengthy discussion with the patient and her daughter today about her current disease stage, prognosis and treatment options.  The patient understands that she has incurable disease and any treatment would be  palliative in nature. I gave her the option of palliative care and hospice referral versus consideration of systemic chemotherapy. She is interested in systemic chemotherapy. I discussed with the patient her options for her treatment including standard systemic chemotherapy with carboplatin and etoposide versus enrollment in the ECoG clinical trial E. 2511 with the patient would be treated with cisplatin and etoposide at plus/minus Veliparib orally for 7 days. The patient and her daughters are interested in considering the clinical trial. I discussed with her the adverse effect of the chemotherapy including but not limited to alopecia, myelosuppression, nausea and vomiting, peripheral neuropathy, liver or renal dysfunction. She will be seen by the clinical research nurse later today to evaluate her eligibility. I will arrange for the patient to have a chemotherapy education class before starting the first dose of her treatment. She is expected to start the first cycle of her treatment in around one week. She would come back for follow up visit in 2 weeks for reevaluation and management any adverse effect of her treatment. She was advised to call immediately if she has any concerning symptoms in the interval. For smoke cessation, I strongly encouraged the patient to quit smoking and offered her smoke cessation program. The patient voices understanding of current disease status and treatment options and is in agreement with the current  care plan.  All questions were answered. The patient knows to call the clinic with any problems, questions or concerns. We can certainly see the patient much sooner if necessary.  I spent 20 minutes counseling the patient face to face. The total time spent in the appointment was 30 minutes.  Disclaimer: This note was dictated with voice recognition software. Similar sounding words can inadvertently be transcribed and may not be corrected upon review.

## 2013-08-15 NOTE — Telephone Encounter (Signed)
Spoke with patient to see if she could come in for her appointment earlier today 06/12.  Per Dr. Julien Nordmann.  Patient stated that she could come in around 9:30.  Informed [facility].

## 2013-08-15 NOTE — Telephone Encounter (Signed)
Changed appt per desk LPN   JMW

## 2013-08-16 ENCOUNTER — Encounter (HOSPITAL_COMMUNITY): Payer: Self-pay | Admitting: Emergency Medicine

## 2013-08-16 ENCOUNTER — Encounter: Payer: Self-pay | Admitting: Internal Medicine

## 2013-08-16 ENCOUNTER — Other Ambulatory Visit: Payer: Self-pay

## 2013-08-16 ENCOUNTER — Inpatient Hospital Stay (HOSPITAL_COMMUNITY)
Admission: EM | Admit: 2013-08-16 | Discharge: 2013-08-19 | DRG: 291 | Disposition: A | Discharge status: Home Health | Payer: Medicare Other | Attending: Family Medicine | Admitting: Family Medicine

## 2013-08-16 ENCOUNTER — Emergency Department (HOSPITAL_COMMUNITY): Payer: Medicare Other

## 2013-08-16 DIAGNOSIS — J189 Pneumonia, unspecified organism: Secondary | ICD-10-CM

## 2013-08-16 DIAGNOSIS — J9600 Acute respiratory failure, unspecified whether with hypoxia or hypercapnia: Secondary | ICD-10-CM

## 2013-08-16 DIAGNOSIS — J9601 Acute respiratory failure with hypoxia: Secondary | ICD-10-CM

## 2013-08-16 DIAGNOSIS — E785 Hyperlipidemia, unspecified: Secondary | ICD-10-CM | POA: Diagnosis present

## 2013-08-16 DIAGNOSIS — F411 Generalized anxiety disorder: Secondary | ICD-10-CM | POA: Diagnosis present

## 2013-08-16 DIAGNOSIS — Z720 Tobacco use: Secondary | ICD-10-CM

## 2013-08-16 DIAGNOSIS — Z681 Body mass index (BMI) 19 or less, adult: Secondary | ICD-10-CM | POA: Diagnosis not present

## 2013-08-16 DIAGNOSIS — Z8249 Family history of ischemic heart disease and other diseases of the circulatory system: Secondary | ICD-10-CM

## 2013-08-16 DIAGNOSIS — I4891 Unspecified atrial fibrillation: Secondary | ICD-10-CM | POA: Diagnosis present

## 2013-08-16 DIAGNOSIS — R9431 Abnormal electrocardiogram [ECG] [EKG]: Secondary | ICD-10-CM

## 2013-08-16 DIAGNOSIS — I509 Heart failure, unspecified: Secondary | ICD-10-CM

## 2013-08-16 DIAGNOSIS — C3490 Malignant neoplasm of unspecified part of unspecified bronchus or lung: Secondary | ICD-10-CM

## 2013-08-16 DIAGNOSIS — N179 Acute kidney failure, unspecified: Secondary | ICD-10-CM

## 2013-08-16 DIAGNOSIS — F17200 Nicotine dependence, unspecified, uncomplicated: Secondary | ICD-10-CM | POA: Diagnosis present

## 2013-08-16 DIAGNOSIS — J90 Pleural effusion, not elsewhere classified: Secondary | ICD-10-CM

## 2013-08-16 DIAGNOSIS — E876 Hypokalemia: Secondary | ICD-10-CM

## 2013-08-16 DIAGNOSIS — R634 Abnormal weight loss: Secondary | ICD-10-CM

## 2013-08-16 DIAGNOSIS — R05 Cough: Secondary | ICD-10-CM

## 2013-08-16 DIAGNOSIS — E039 Hypothyroidism, unspecified: Secondary | ICD-10-CM

## 2013-08-16 DIAGNOSIS — S065XAA Traumatic subdural hemorrhage with loss of consciousness status unknown, initial encounter: Secondary | ICD-10-CM

## 2013-08-16 DIAGNOSIS — Z96649 Presence of unspecified artificial hip joint: Secondary | ICD-10-CM

## 2013-08-16 DIAGNOSIS — R059 Cough, unspecified: Secondary | ICD-10-CM

## 2013-08-16 DIAGNOSIS — C3430 Malignant neoplasm of lower lobe, unspecified bronchus or lung: Secondary | ICD-10-CM | POA: Diagnosis present

## 2013-08-16 DIAGNOSIS — I5033 Acute on chronic diastolic (congestive) heart failure: Principal | ICD-10-CM | POA: Diagnosis present

## 2013-08-16 DIAGNOSIS — Z1212 Encounter for screening for malignant neoplasm of rectum: Secondary | ICD-10-CM

## 2013-08-16 DIAGNOSIS — E43 Unspecified severe protein-calorie malnutrition: Secondary | ICD-10-CM

## 2013-08-16 DIAGNOSIS — J441 Chronic obstructive pulmonary disease with (acute) exacerbation: Secondary | ICD-10-CM | POA: Diagnosis present

## 2013-08-16 DIAGNOSIS — E871 Hypo-osmolality and hyponatremia: Secondary | ICD-10-CM

## 2013-08-16 DIAGNOSIS — Z1239 Encounter for other screening for malignant neoplasm of breast: Secondary | ICD-10-CM

## 2013-08-16 DIAGNOSIS — K3189 Other diseases of stomach and duodenum: Secondary | ICD-10-CM

## 2013-08-16 DIAGNOSIS — I10 Essential (primary) hypertension: Secondary | ICD-10-CM | POA: Diagnosis present

## 2013-08-16 DIAGNOSIS — R590 Localized enlarged lymph nodes: Secondary | ICD-10-CM

## 2013-08-16 DIAGNOSIS — Z1211 Encounter for screening for malignant neoplasm of colon: Secondary | ICD-10-CM

## 2013-08-16 DIAGNOSIS — G9340 Encephalopathy, unspecified: Secondary | ICD-10-CM

## 2013-08-16 DIAGNOSIS — T7840XA Allergy, unspecified, initial encounter: Secondary | ICD-10-CM

## 2013-08-16 DIAGNOSIS — J9691 Respiratory failure, unspecified with hypoxia: Secondary | ICD-10-CM

## 2013-08-16 DIAGNOSIS — I959 Hypotension, unspecified: Secondary | ICD-10-CM

## 2013-08-16 DIAGNOSIS — E860 Dehydration: Secondary | ICD-10-CM

## 2013-08-16 DIAGNOSIS — E7800 Pure hypercholesterolemia, unspecified: Secondary | ICD-10-CM

## 2013-08-16 DIAGNOSIS — I4892 Unspecified atrial flutter: Secondary | ICD-10-CM

## 2013-08-16 DIAGNOSIS — R0602 Shortness of breath: Secondary | ICD-10-CM | POA: Diagnosis present

## 2013-08-16 DIAGNOSIS — I369 Nonrheumatic tricuspid valve disorder, unspecified: Secondary | ICD-10-CM

## 2013-08-16 DIAGNOSIS — I119 Hypertensive heart disease without heart failure: Secondary | ICD-10-CM

## 2013-08-16 DIAGNOSIS — S065X9A Traumatic subdural hemorrhage with loss of consciousness of unspecified duration, initial encounter: Secondary | ICD-10-CM

## 2013-08-16 LAB — BLOOD GAS, ARTERIAL
Acid-Base Excess: 2.5 mmol/L — ABNORMAL HIGH (ref 0.0–2.0)
Bicarbonate: 27.1 mEq/L — ABNORMAL HIGH (ref 20.0–24.0)
Drawn by: 308601
FIO2: 1 %
O2 SAT: 97.3 %
PH ART: 7.403 (ref 7.350–7.450)
Patient temperature: 98.6
TCO2: 24.5 mmol/L (ref 0–100)
pCO2 arterial: 44.4 mmHg (ref 35.0–45.0)
pO2, Arterial: 99.3 mmHg (ref 80.0–100.0)

## 2013-08-16 LAB — URINALYSIS, ROUTINE W REFLEX MICROSCOPIC
BILIRUBIN URINE: NEGATIVE
Glucose, UA: NEGATIVE mg/dL
Hgb urine dipstick: NEGATIVE
KETONES UR: NEGATIVE mg/dL
LEUKOCYTES UA: NEGATIVE
NITRITE: NEGATIVE
PROTEIN: NEGATIVE mg/dL
Specific Gravity, Urine: 1.017 (ref 1.005–1.030)
Urobilinogen, UA: 0.2 mg/dL (ref 0.0–1.0)
pH: 6 (ref 5.0–8.0)

## 2013-08-16 LAB — CBC WITH DIFFERENTIAL/PLATELET
BASOS ABS: 0 10*3/uL (ref 0.0–0.1)
BASOS PCT: 0 % (ref 0–1)
Basophils Absolute: 0 10*3/uL (ref 0.0–0.1)
Basophils Relative: 0 % (ref 0–1)
EOS ABS: 0 10*3/uL (ref 0.0–0.7)
Eosinophils Absolute: 0.1 10*3/uL (ref 0.0–0.7)
Eosinophils Relative: 1 % (ref 0–5)
Eosinophils Relative: 0 % (ref 0–5)
HCT: 35 % — ABNORMAL LOW (ref 36.0–46.0)
HEMATOCRIT: 36.6 % (ref 36.0–46.0)
HEMOGLOBIN: 11.4 g/dL — AB (ref 12.0–15.0)
Hemoglobin: 12.3 g/dL (ref 12.0–15.0)
LYMPHS ABS: 1.5 10*3/uL (ref 0.7–4.0)
LYMPHS PCT: 12 % (ref 12–46)
Lymphocytes Relative: 7 % — ABNORMAL LOW (ref 12–46)
Lymphs Abs: 0.7 10*3/uL (ref 0.7–4.0)
MCH: 33.1 pg (ref 26.0–34.0)
MCH: 31.2 pg (ref 26.0–34.0)
MCHC: 33.6 g/dL (ref 30.0–36.0)
MCHC: 32.6 g/dL (ref 30.0–36.0)
MCV: 98.4 fL (ref 78.0–100.0)
MCV: 95.9 fL (ref 78.0–100.0)
MONO ABS: 0.7 10*3/uL (ref 0.1–1.0)
MONOS PCT: 3 % (ref 3–12)
Monocytes Absolute: 0.3 10*3/uL (ref 0.1–1.0)
Monocytes Relative: 6 % (ref 3–12)
Neutro Abs: 10.3 10*3/uL — ABNORMAL HIGH (ref 1.7–7.7)
Neutro Abs: 9.3 10*3/uL — ABNORMAL HIGH (ref 1.7–7.7)
Neutrophils Relative %: 81 % — ABNORMAL HIGH (ref 43–77)
Neutrophils Relative %: 90 % — ABNORMAL HIGH (ref 43–77)
PLATELETS: 375 10*3/uL (ref 150–400)
PLATELETS: 424 10*3/uL — AB (ref 150–400)
RBC: 3.72 MIL/uL — AB (ref 3.87–5.11)
RBC: 3.65 MIL/uL — ABNORMAL LOW (ref 3.87–5.11)
RDW: 13.9 % (ref 11.5–15.5)
RDW: 13.9 % (ref 11.5–15.5)
WBC: 12.6 10*3/uL — AB (ref 4.0–10.5)
WBC: 10.3 10*3/uL (ref 4.0–10.5)

## 2013-08-16 LAB — COMPREHENSIVE METABOLIC PANEL
ALT: 11 U/L (ref 0–35)
AST: 25 U/L (ref 0–37)
Albumin: 2.5 g/dL — ABNORMAL LOW (ref 3.5–5.2)
Alkaline Phosphatase: 129 U/L — ABNORMAL HIGH (ref 39–117)
BILIRUBIN TOTAL: 0.2 mg/dL — AB (ref 0.3–1.2)
BUN: 10 mg/dL (ref 6–23)
CHLORIDE: 94 meq/L — AB (ref 96–112)
CO2: 28 meq/L (ref 19–32)
CREATININE: 0.7 mg/dL (ref 0.50–1.10)
Calcium: 9.9 mg/dL (ref 8.4–10.5)
GFR calc Af Amer: >90 mL/min (ref >90)
GFR, EST NON AFRICAN AMERICAN: 83 mL/min — AB (ref >90)
Glucose, Bld: 162 mg/dL — ABNORMAL HIGH (ref 70–99)
POTASSIUM: 3.3 meq/L — AB (ref 3.7–5.3)
SODIUM: 137 meq/L (ref 137–147)
Total Protein: 7.3 g/dL (ref 6.0–8.3)

## 2013-08-16 LAB — BASIC METABOLIC PANEL
BUN: 11 mg/dL (ref 6–23)
CO2: 28 meq/L (ref 19–32)
Calcium: 10.2 mg/dL (ref 8.4–10.5)
Chloride: 97 mEq/L (ref 96–112)
Creatinine, Ser: 0.75 mg/dL (ref 0.50–1.10)
GFR calc Af Amer: >90 mL/min (ref >90)
GFR calc non Af Amer: 81 mL/min — ABNORMAL LOW (ref >90)
Glucose, Bld: 126 mg/dL — ABNORMAL HIGH (ref 70–99)
Potassium: 3.6 mEq/L — ABNORMAL LOW (ref 3.7–5.3)
SODIUM: 137 meq/L (ref 137–147)

## 2013-08-16 LAB — MAGNESIUM: Magnesium: 1.6 mg/dL (ref 1.5–2.5)

## 2013-08-16 LAB — TSH: TSH: 0.227 u[IU]/mL — ABNORMAL LOW (ref 0.350–4.500)

## 2013-08-16 LAB — TROPONIN I

## 2013-08-16 LAB — RAPID URINE DRUG SCREEN, HOSP PERFORMED
AMPHETAMINES: NOT DETECTED
BARBITURATES: NOT DETECTED
BENZODIAZEPINES: NOT DETECTED
COCAINE: NOT DETECTED
Opiates: NOT DETECTED
TETRAHYDROCANNABINOL: NOT DETECTED

## 2013-08-16 LAB — STREP PNEUMONIAE URINARY ANTIGEN: Strep Pneumo Urinary Antigen: NEGATIVE

## 2013-08-16 LAB — APTT: aPTT: 34 seconds (ref 24–37)

## 2013-08-16 LAB — PROTIME-INR
INR: 0.99 (ref 0.00–1.49)
PROTHROMBIN TIME: 12.9 s (ref 11.6–15.2)

## 2013-08-16 LAB — PRO B NATRIURETIC PEPTIDE: PRO B NATRI PEPTIDE: 381.6 pg/mL — AB (ref 0–125)

## 2013-08-16 MED ORDER — ASPIRIN EC 81 MG PO TBEC
81.0000 mg | DELAYED_RELEASE_TABLET | Freq: Every day | ORAL | Status: DC
  Administered 2013-08-16 – 2013-08-19 (×4): 81 mg via ORAL
  Filled 2013-08-16 (×4): qty 1

## 2013-08-16 MED ORDER — LISINOPRIL 10 MG PO TABS
10.0000 mg | ORAL_TABLET | Freq: Every day | ORAL | Status: DC
  Administered 2013-08-18 – 2013-08-19 (×2): 10 mg via ORAL
  Filled 2013-08-16 (×4): qty 1

## 2013-08-16 MED ORDER — PROCHLORPERAZINE MALEATE 10 MG PO TABS
10.0000 mg | ORAL_TABLET | Freq: Four times a day (QID) | ORAL | Status: DC | PRN
  Administered 2013-08-18: 10 mg via ORAL
  Filled 2013-08-16 (×2): qty 1

## 2013-08-16 MED ORDER — TRAMADOL HCL 50 MG PO TABS
50.0000 mg | ORAL_TABLET | Freq: Four times a day (QID) | ORAL | Status: DC | PRN
  Administered 2013-08-18 – 2013-08-19 (×2): 50 mg via ORAL
  Filled 2013-08-16 (×3): qty 1

## 2013-08-16 MED ORDER — PIPERACILLIN-TAZOBACTAM 3.375 G IVPB
3.3750 g | Freq: Three times a day (TID) | INTRAVENOUS | Status: DC
  Administered 2013-08-16 – 2013-08-19 (×10): 3.375 g via INTRAVENOUS
  Filled 2013-08-16 (×11): qty 50

## 2013-08-16 MED ORDER — SODIUM CHLORIDE 0.9 % IJ SOLN: 3.0000 mL | INTRAMUSCULAR | Status: DC | PRN

## 2013-08-16 MED ORDER — PANTOPRAZOLE SODIUM 40 MG PO TBEC
40.0000 mg | DELAYED_RELEASE_TABLET | Freq: Every day | ORAL | Status: DC
  Administered 2013-08-16 – 2013-08-18 (×3): 40 mg via ORAL
  Filled 2013-08-16 (×5): qty 1

## 2013-08-16 MED ORDER — METHYLPREDNISOLONE SODIUM SUCC 125 MG IJ SOLR
125.0000 mg | Freq: Once | INTRAMUSCULAR | Status: AC
  Administered 2013-08-16: 125 mg via INTRAVENOUS
  Filled 2013-08-16: qty 2

## 2013-08-16 MED ORDER — FUROSEMIDE 10 MG/ML IJ SOLN
40.0000 mg | Freq: Once | INTRAMUSCULAR | Status: AC
  Administered 2013-08-16: 40 mg via INTRAVENOUS
  Filled 2013-08-16: qty 4

## 2013-08-16 MED ORDER — FUROSEMIDE 40 MG PO TABS
40.0000 mg | ORAL_TABLET | Freq: Every day | ORAL | Status: DC
  Administered 2013-08-17 – 2013-08-19 (×3): 40 mg via ORAL
  Filled 2013-08-16 (×4): qty 1

## 2013-08-16 MED ORDER — GUAIFENESIN ER 600 MG PO TB12
600.0000 mg | ORAL_TABLET | Freq: Two times a day (BID) | ORAL | Status: DC
  Administered 2013-08-16 – 2013-08-19 (×7): 600 mg via ORAL
  Filled 2013-08-16 (×8): qty 1

## 2013-08-16 MED ORDER — LEVOFLOXACIN IN D5W 500 MG/100ML IV SOLN
500.0000 mg | Freq: Once | INTRAVENOUS | Status: AC
  Administered 2013-08-16: 500 mg via INTRAVENOUS
  Filled 2013-08-16: qty 100

## 2013-08-16 MED ORDER — DILTIAZEM HCL 60 MG PO TABS
60.0000 mg | ORAL_TABLET | Freq: Three times a day (TID) | ORAL | Status: DC
  Administered 2013-08-16 – 2013-08-18 (×7): 60 mg via ORAL
  Filled 2013-08-16 (×11): qty 1

## 2013-08-16 MED ORDER — MOMETASONE FURO-FORMOTEROL FUM 200-5 MCG/ACT IN AERO
2.0000 | INHALATION_SPRAY | Freq: Two times a day (BID) | RESPIRATORY_TRACT | Status: DC
  Administered 2013-08-16 – 2013-08-19 (×7): 2 via RESPIRATORY_TRACT
  Filled 2013-08-16: qty 8.8

## 2013-08-16 MED ORDER — SODIUM CHLORIDE 0.9 % IJ SOLN
3.0000 mL | Freq: Two times a day (BID) | INTRAMUSCULAR | Status: DC
  Administered 2013-08-18 – 2013-08-19 (×3): 3 mL via INTRAVENOUS

## 2013-08-16 MED ORDER — IPRATROPIUM BROMIDE 0.02 % IN SOLN
0.5000 mg | Freq: Three times a day (TID) | RESPIRATORY_TRACT | Status: DC
  Administered 2013-08-17 – 2013-08-18 (×4): 0.5 mg via RESPIRATORY_TRACT
  Filled 2013-08-16 (×5): qty 2.5

## 2013-08-16 MED ORDER — IPRATROPIUM BROMIDE 0.02 % IN SOLN
0.5000 mg | Freq: Once | RESPIRATORY_TRACT | Status: AC
  Administered 2013-08-16: 0.5 mg via RESPIRATORY_TRACT
  Filled 2013-08-16: qty 2.5

## 2013-08-16 MED ORDER — BIOTENE DRY MOUTH MT LIQD
15.0000 mL | Freq: Two times a day (BID) | OROMUCOSAL | Status: DC
  Administered 2013-08-16 – 2013-08-19 (×6): 15 mL via OROMUCOSAL

## 2013-08-16 MED ORDER — ATORVASTATIN CALCIUM 20 MG PO TABS
20.0000 mg | ORAL_TABLET | Freq: Every day | ORAL | Status: DC
  Administered 2013-08-16 – 2013-08-18 (×3): 20 mg via ORAL
  Filled 2013-08-16 (×3): qty 1
  Filled 2013-08-16: qty 2

## 2013-08-16 MED ORDER — ENOXAPARIN SODIUM 40 MG/0.4ML ~~LOC~~ SOLN
40.0000 mg | SUBCUTANEOUS | Status: DC
  Administered 2013-08-16 – 2013-08-19 (×4): 40 mg via SUBCUTANEOUS
  Filled 2013-08-16 (×4): qty 0.4

## 2013-08-16 MED ORDER — ALBUTEROL (5 MG/ML) CONTINUOUS INHALATION SOLN
10.0000 mg/h | INHALATION_SOLUTION | Freq: Once | RESPIRATORY_TRACT | Status: AC
  Administered 2013-08-16: 10 mg/h via RESPIRATORY_TRACT
  Filled 2013-08-16: qty 20

## 2013-08-16 MED ORDER — POLYETHYLENE GLYCOL 3350 17 G PO PACK
17.0000 g | PACK | Freq: Every day | ORAL | Status: DC | PRN
  Filled 2013-08-16: qty 1

## 2013-08-16 MED ORDER — SODIUM CHLORIDE 0.9 % IV SOLN
250.0000 mL | INTRAVENOUS | Status: DC | PRN
  Administered 2013-08-16: 250 mL via INTRAVENOUS

## 2013-08-16 MED ORDER — DOCUSATE SODIUM 100 MG PO CAPS
200.0000 mg | ORAL_CAPSULE | Freq: Every day | ORAL | Status: DC
  Administered 2013-08-16 – 2013-08-17 (×2): 200 mg via ORAL
  Filled 2013-08-16 (×4): qty 2

## 2013-08-16 MED ORDER — IPRATROPIUM BROMIDE 0.02 % IN SOLN
0.5000 mg | Freq: Four times a day (QID) | RESPIRATORY_TRACT | Status: DC
  Administered 2013-08-16 (×3): 0.5 mg via RESPIRATORY_TRACT
  Filled 2013-08-16 (×3): qty 2.5

## 2013-08-16 MED ORDER — POTASSIUM CHLORIDE 10 MEQ/100ML IV SOLN
10.0000 meq | INTRAVENOUS | Status: AC
  Administered 2013-08-16 (×2): 10 meq via INTRAVENOUS
  Filled 2013-08-16 (×2): qty 100

## 2013-08-16 MED ORDER — LEVOTHYROXINE SODIUM 100 MCG PO TABS
100.0000 ug | ORAL_TABLET | Freq: Every day | ORAL | Status: DC
  Administered 2013-08-16 – 2013-08-19 (×4): 100 ug via ORAL
  Filled 2013-08-16 (×3): qty 2
  Filled 2013-08-16 (×3): qty 1

## 2013-08-16 MED ORDER — LEVALBUTEROL HCL 0.63 MG/3ML IN NEBU
0.6300 mg | INHALATION_SOLUTION | RESPIRATORY_TRACT | Status: DC | PRN
  Administered 2013-08-16: 0.63 mg via RESPIRATORY_TRACT
  Filled 2013-08-16: qty 3

## 2013-08-16 NOTE — ED Provider Notes (Addendum)
CSN: [id number]     Arrival date & time 08/16/13  0103 History   First MD Initiated Contact with Patient 08/16/13 0108     Chief Complaint  Patient presents with  . Shortness of Breath     (Consider location/radiation/quality/duration/timing/severity/associated sxs/prior Treatment) HPI Comments: 73 y/o with recent admission for CAP and dx of Lung CA during that stay, and HL, Afib, COPD who comes in to the ER with cc of dob. Pt reports having a cough for 1 week. Started having dib y'day. DIB now at rest, and has progressed. Pt has no chest pain. Cough is producing clear phlegm. Pt is a smoker, and has used 1 tx at home with minimal relief.  Patient is a 73 y.o. female presenting with shortness of breath. The history is provided by the patient.  Shortness of Breath Associated symptoms: cough and wheezing   Associated symptoms: no abdominal pain, no chest pain, no fever, no headaches, no neck pain, no rash and no vomiting     Past Medical History  Diagnosis Date  . Compensated hypothyroidism   . Essential hypertension   . Dyslipidemia   . Colon polyps   . Anxiety   . COPD (chronic obstructive pulmonary disease)     secondary to smoking.  . Avascular necrosis     right femoral head  . Hyperlipidemia   . Anemia      Postoperative hemorrhagic anemia  . Allergic reaction 10/24/2012  . Arthritis     "hands" (07/31/2013)  . Lung mass     /CT 07/11/2013  . Mass of stomach     /CT 07/11/2013  . CAP (community acquired pneumonia) 07/30/2013    "first time I've ever had pneumonia"  . Atrial flutter with rapid ventricular response     Archie Endo 07/31/2013  . Subdural hemorrhage following injury 2013    fall/notes 07/30/2013   Past Surgical History  Procedure Laterality Date  . Colonoscopy      with polypectomy and biopsy  . Total hip arthroplasty Right 2002  . Dilation and curettage of uterus    . Tubal ligation    . Esophagogastroduodenoscopy (egd) with propofol N/A 08/01/2013   Procedure: ESOPHAGOGASTRODUODENOSCOPY (EGD) WITH PROPOFOL;  Surgeon: Jeryl Columbia, MD;  Location: [facility] ENDOSCOPY;  Service: Endoscopy;  Laterality: N/A;  . Video bronchoscopy Bilateral 08/04/2013    Procedure: VIDEO BRONCHOSCOPY WITH FLUORO;  Surgeon: Elsie Stain, MD;  Location: [place];  Service: Cardiopulmonary;  Laterality: Bilateral;  . Video bronchoscopy N/A 08/04/2013    Procedure: VIDEO BRONCHOSCOPY WITH FLUORO;  Surgeon: Elsie Stain, MD;  Location: [place];  Service: Cardiopulmonary;  Laterality: N/A;   Family History  Problem Relation Age of Onset  . Heart attack Mother   . Heart attack Father    History  Substance Use Topics  . Smoking status: Current Some Day Smoker -- 0.50 packs/day for 19 years    Types: Cigarettes  . Smokeless tobacco: Never Used  . Alcohol Use: Yes     Comment: 07/31/2013 "40oz beer q 2 days; none in the last 4 weeks"   OB History   Grav Para Term Preterm Abortions TAB SAB Ect Mult Living                 Review of Systems  Constitutional: Negative for fever and activity change.  HENT: Negative for facial swelling.   Respiratory: Positive for cough, shortness of breath and wheezing.   Cardiovascular: Negative for chest pain and leg  swelling.  Gastrointestinal: Negative for nausea, vomiting, abdominal pain, diarrhea, constipation, blood in stool and abdominal distention.  Genitourinary: Negative for dysuria, hematuria and difficulty urinating.  Musculoskeletal: Negative for neck pain.  Skin: Negative for color change and rash.  Neurological: Negative for speech difficulty and headaches.  Hematological: Does not bruise/bleed easily.  Psychiatric/Behavioral: Negative for confusion.      Allergies  Black pepper and Tomato  Home Medications   Prior to Admission medications   Medication Sig Start Date End Date Taking? Authorizing Provider  atorvastatin (LIPITOR) 20 MG tablet Take 1 tablet (20 mg total) by mouth at bedtime. 08/06/13   Yes Barton Dubois, MD  diltiazem (CARDIZEM) 60 MG tablet Take 1 tablet (60 mg total) by mouth every 8 (eight) hours. 08/06/13  Yes Barton Dubois, MD  docusate sodium 100 MG CAPS Take 200 mg by mouth at bedtime. 08/06/13  Yes Barton Dubois, MD  ENSURE (ENSURE) Take 237 mLs by mouth 3 (three) times daily between meals.   Yes Historical Provider, MD  Ipratropium-Albuterol (COMBIVENT) 20-100 MCG/ACT AERS respimat Inhale 1 puff into the lungs every 6 (six) hours. 08/06/13  Yes Barton Dubois, MD  levothyroxine (SYNTHROID, LEVOTHROID) 100 MCG tablet Take 100 mcg by mouth daily before breakfast.   Yes Historical Provider, MD  lisinopril (PRINIVIL,ZESTRIL) 10 MG tablet Take 1 tablet (10 mg total) by mouth daily. 07/09/13  Yes Orlena Sheldon, PA-C  Multiple Vitamin (MULTIVITAMIN) tablet Take 1 tablet by mouth daily.   Yes Historical Provider, MD  prochlorperazine (COMPAZINE) 10 MG tablet Take 1 tablet (10 mg total) by mouth every 6 (six) hours as needed for nausea or vomiting. 08/15/13  Yes Curt Bears, MD  traMADol (ULTRAM) 50 MG tablet Take 50 mg by mouth every 6 (six) hours as needed for moderate pain.   Yes Historical Provider, MD  EPINEPHrine (EPI-PEN) 0.3 mg/0.3 mL SOAJ injection Inject 0.3 mLs (0.3 mg total) into the muscle once. 10/24/12   Lonie Peak Dixon, PA-C  polyethylene glycol (MIRALAX / GLYCOLAX) packet Take 17 g by mouth daily as needed for moderate constipation. 08/06/13   Barton Dubois, MD   BP 136/54  Pulse 130  Temp(Src) 97.8 F (36.6 C) (Oral)  Resp 23  SpO2 91% Physical Exam  Nursing note and vitals reviewed. Constitutional: She is oriented to person, place, and time. She appears well-developed.  HENT:  Head: Normocephalic and atraumatic.  Eyes: Conjunctivae and EOM are normal. Pupils are equal, round, and reactive to light.  Neck: Normal range of motion. Neck supple. No JVD present.  Cardiovascular: Normal rate, regular rhythm and normal heart sounds.   Pulmonary/Chest: She is in  respiratory distress. She has wheezes.  Pt has diffuse wheezing and rhonchus breath sounds.  Abdominal: Soft. Bowel sounds are normal. She exhibits no distension. There is no tenderness. There is no rebound and no guarding.  Musculoskeletal: She exhibits no edema.  Neurological: She is alert and oriented to person, place, and time.  Skin: Skin is warm and dry.    ED Course  Procedures (including critical care time) Labs Review Labs Reviewed  CBC WITH DIFFERENTIAL - Abnormal; Notable for the following:    WBC 12.6 (*)    RBC 3.72 (*)    Neutrophils Relative % 81 (*)    Neutro Abs 10.3 (*)    All other components within normal limits  BASIC METABOLIC PANEL - Abnormal; Notable for the following:    Potassium 3.6 (*)    Glucose, Bld 126 (*)  GFR calc non Af Amer 81 (*)    All other components within normal limits  PRO B NATRIURETIC PEPTIDE - Abnormal; Notable for the following:    Pro B Natriuretic peptide (BNP) 381.6 (*)    All other components within normal limits  TROPONIN I  APTT  PROTIME-INR  URINALYSIS, ROUTINE W REFLEX MICROSCOPIC  TROPONIN I  TROPONIN I  TROPONIN I  BLOOD GAS, ARTERIAL    Imaging Review Dg Chest 2 View  08/16/2013   CLINICAL DATA:  Shortness of breath and productive cough. Recently diagnosed pneumonia. History of smoking and lung cancer.  EXAM: CHEST  2 VIEW  COMPARISON:  Chest radiograph performed 07/30/2013, and PET/CT performed 08/08/2013  FINDINGS: The patient's large left lower lobe lung mass is again noted, with underlying postobstructive consolidation, raising concern for persistent pneumonia. Vascular congestion is again seen. Bilateral bulky hilar lymphadenopathy is again noted. A small left pleural effusion is seen. Known right-sided pulmonary nodules are difficult to fully characterize on radiograph. There is no evidence of pneumothorax.  The cardiomediastinal silhouette is mildly enlarged. No acute osseous abnormalities are identified. There is  chronic superior subluxation of the right humeral head.  IMPRESSION: 1. Persistent postobstructive consolidation at the left lung base, with a large left lower lobe lung mass again seen. Small left pleural effusion noted. Underlying vascular congestion again seen. 2. Bilateral bulky hilar lymphadenopathy again noted; mild cardiomegaly. Known right-sided pulmonary nodules are less characterized on radiograph.   Electronically Signed   By: Garald Balding M.D.   On: 08/16/2013 02:43     EKG Interpretation None      Date: 08/16/2013  Rate: 80  Rhythm: normal sinus rhythm  QRS Axis: normal  Intervals: QT prolonged  ST/T Wave abnormalities: nonspecific ST/T changes  Conduction Disutrbances:none  Narrative Interpretation:   Old EKG Reviewed: unchanged Inferolateral q waves - not new. s1q3t3 patter noted    MDM   Final diagnoses:  Respiratory failure with hypoxia  Pleural effusion on left  COPD with exacerbation    Pt comes in with cc of dib. Hypoxic on room air, tachypneic, diffuse rales. Hx of COPD, lung CA recent diagnosis - and CAP tx last admission. CT PE on 5/27 was neg for PE.  Pt is in hypoxic respiratory failure.  Given the lung exam - appears to be COPD exacerbation, with possible underlying left sided unresolved PNA. Levaquin given (realize in retrospect that pt has prolonged qt - and hospitalized is aware of this).  Possible worsening mass effects from cancer also possible.  Will admit.  Smoking cessation instruction/counseling given:  counseled patient on the dangers of tobacco use, advised patient to stop smoking, and reviewed strategies to maximize success. DISCUSSION 3-5 MIN   CRITICAL CARE Performed by: Varney Biles   Total critical care time: 60 min Critical care time was exclusive of separately billable procedures and treating other patients.  Critical care was necessary to treat or prevent imminent or life-threatening deterioration.  Critical care  was time spent personally by me on the following activities: development of treatment plan with patient and/or surrogate as well as nursing, discussions with consultants, evaluation of patient's response to treatment, examination of patient, obtaining history from patient or surrogate, ordering and performing treatments and interventions, ordering and review of laboratory studies, ordering and review of radiographic studies, pulse oximetry and re-evaluation of patient's condition.    Varney Biles, MD 08/16/13 [place], MD 08/16/13 [phone]

## 2013-08-16 NOTE — ED Notes (Signed)
Bed: CB44 Expected date:  Expected time:  Means of arrival:  Comments: EMS/lung cancer/SOB

## 2013-08-16 NOTE — Progress Notes (Signed)
Patient seen and evaluated earlier this a.m. by my associate. Patient is a 73 year old smoker with history of paroxysmal atrial fibrillation not on anticoagulation, COPD, and history of small cell lung cancer. Presenting with respiratory failure and hypoxia. Patient has persistent postobstructive consolidation and left lung base with a large left lung lower lobe lobe lung mass. Followed by Dr. Julien Nordmann.  Patient given Lasix 40 mg IV this morning, albuterol, Dulera, and Atrovent on board. Patient also given Solu-Medrol 125 mg IV x1.  We'll reassess next a.m.  Reportedly patient in a flutter but patient has Cardizem on board for rate control.  Gedalia Mcmillon, [employer]

## 2013-08-16 NOTE — ED Notes (Addendum)
Pt put on 10L nonrebreather per admitting MD.

## 2013-08-16 NOTE — ED Notes (Signed)
Per EMS pt states she has hx of lung cancer and was recently released from hospital on antibiotics. Pt states she took full course of med's and all of a sudden tonight began having SOB. Fire dept states pt has RA of 85% and was 96% on [place]. Pt has had 10 mg albuterol and 0.5 Atrovent given by EMS. Pt has taken inhaler as well.

## 2013-08-16 NOTE — Progress Notes (Signed)
Patient has not been able to produce a sputum specimen.

## 2013-08-16 NOTE — Progress Notes (Signed)
  Echocardiogram 2D Echocardiogram has been performed.  Doyle Askew 08/16/2013, 10:15 AM

## 2013-08-16 NOTE — ED Notes (Signed)
Pt put on 4L of oxygen via nasal cannula per MD.

## 2013-08-16 NOTE — H&P (Signed)
PCP:  Karis Juba, PA-C  [facility]  Chief Complaint:  Shortness of breath  HPI: Emma Douglas is a 73 y.o. female   has a past medical history of Compensated hypothyroidism; Essential hypertension; Dyslipidemia; Colon polyps; Anxiety; COPD (chronic obstructive pulmonary disease); Avascular necrosis; Hyperlipidemia; Anemia; Allergic reaction (10/24/2012); Arthritis; Lung mass; Mass of stomach; CAP (community acquired pneumonia) (07/30/2013); Atrial flutter with rapid ventricular response; and Subdural hemorrhage following injury (2013).   Presented with  Patient developed worsening wheezing and shortness of breath this evening. She denies any fever or chills no chest pain no worsening cough. Patietn has been recently diagnosed with small cell lung CA. She continues to smoke. Patient has hx of  paroxysmal a.fib not on anticoagulation. She was found to be hypoxic down to 85% despite repeated nebulizer treatments remained tachypnic. CXR was worriosme for pulmonary congestion.  She denies any cough no fever. Family not at bedside.  She had recent admission for a.fibwith RVR and enlarging tumor burden. Per patient she is supposed to follow up with Dr. Julien Nordmann in the near future.  Of note she has history of gastric mass with recent EGD showed no mass  Hospitalist was called for admission for acute respiratory failure with hypoxia.  Review of Systems:    Pertinent positives include: shortness of breath dyspnea on exertion, wheezing, fatigue, weight loss   Constitutional:  No weight loss, night sweats, Fevers, chills,  HEENT:  No headaches, Difficulty swallowing,Tooth/dental problems,Sore throat,  No sneezing, itching, ear ache, nasal congestion, post nasal drip,  Cardio-vascular:  No chest pain, Orthopnea, PND, anasarca, dizziness, palpitations.no Bilateral lower extremity swelling  GI:  No heartburn, indigestion, abdominal pain, nausea, vomiting, diarrhea, change in  bowel habits, loss of appetite, melena, blood in stool, hematemesis Resp:    No excess mucus, no productive cough, No non-productive cough, No coughing up of blood.No change in color of mucus.No wheezing. Skin:  no rash or lesions. No jaundice GU:  no dysuria, change in color of urine, no urgency or frequency. No straining to urinate.  No flank pain.  Musculoskeletal:  No joint pain or no joint swelling. No decreased range of motion. No back pain.  Psych:  No change in mood or affect. No depression or anxiety. No memory loss.  Neuro: no localizing neurological complaints, no tingling, no weakness, no double vision, no gait abnormality, no slurred speech, no confusion  Otherwise ROS are negative except for above, 10 systems were reviewed  Past Medical History: Past Medical History  Diagnosis Date  . Compensated hypothyroidism   . Essential hypertension   . Dyslipidemia   . Colon polyps   . Anxiety   . COPD (chronic obstructive pulmonary disease)     secondary to smoking.  . Avascular necrosis     right femoral head  . Hyperlipidemia   . Anemia      Postoperative hemorrhagic anemia  . Allergic reaction 10/24/2012  . Arthritis     "hands" (07/31/2013)  . Lung mass     /CT 07/11/2013  . Mass of stomach     /CT 07/11/2013  . CAP (community acquired pneumonia) 07/30/2013    "first time I've ever had pneumonia"  . Atrial flutter with rapid ventricular response     Archie Endo 07/31/2013  . Subdural hemorrhage following injury 2013    fall/notes 07/30/2013   Past Surgical History  Procedure Laterality Date  . Colonoscopy      with polypectomy and biopsy  . Total hip arthroplasty  Right 2002  . Dilation and curettage of uterus    . Tubal ligation    . Esophagogastroduodenoscopy (egd) with propofol N/A 08/01/2013    Procedure: ESOPHAGOGASTRODUODENOSCOPY (EGD) WITH PROPOFOL;  Surgeon: Jeryl Columbia, MD;  Location: [facility] ENDOSCOPY;  Service: Endoscopy;  Laterality: N/A;  . Video bronchoscopy  Bilateral 08/04/2013    Procedure: VIDEO BRONCHOSCOPY WITH FLUORO;  Surgeon: Elsie Stain, MD;  Location: [place];  Service: Cardiopulmonary;  Laterality: Bilateral;  . Video bronchoscopy N/A 08/04/2013    Procedure: VIDEO BRONCHOSCOPY WITH FLUORO;  Surgeon: Elsie Stain, MD;  Location: [place];  Service: Cardiopulmonary;  Laterality: N/A;     Medications: Prior to Admission medications   Medication Sig Start Date End Date Taking? Authorizing Provider  atorvastatin (LIPITOR) 20 MG tablet Take 1 tablet (20 mg total) by mouth at bedtime. 08/06/13  Yes Barton Dubois, MD  diltiazem (CARDIZEM) 60 MG tablet Take 1 tablet (60 mg total) by mouth every 8 (eight) hours. 08/06/13  Yes Barton Dubois, MD  docusate sodium 100 MG CAPS Take 200 mg by mouth at bedtime. 08/06/13  Yes Barton Dubois, MD  ENSURE (ENSURE) Take 237 mLs by mouth 3 (three) times daily between meals.   Yes Historical Provider, MD  Ipratropium-Albuterol (COMBIVENT) 20-100 MCG/ACT AERS respimat Inhale 1 puff into the lungs every 6 (six) hours. 08/06/13  Yes Barton Dubois, MD  levothyroxine (SYNTHROID, LEVOTHROID) 100 MCG tablet Take 100 mcg by mouth daily before breakfast.   Yes Historical Provider, MD  lisinopril (PRINIVIL,ZESTRIL) 10 MG tablet Take 1 tablet (10 mg total) by mouth daily. 07/09/13  Yes Orlena Sheldon, PA-C  Multiple Vitamin (MULTIVITAMIN) tablet Take 1 tablet by mouth daily.   Yes Historical Provider, MD  prochlorperazine (COMPAZINE) 10 MG tablet Take 1 tablet (10 mg total) by mouth every 6 (six) hours as needed for nausea or vomiting. 08/15/13  Yes Curt Bears, MD  traMADol (ULTRAM) 50 MG tablet Take 50 mg by mouth every 6 (six) hours as needed for moderate pain.   Yes Historical Provider, MD  EPINEPHrine (EPI-PEN) 0.3 mg/0.3 mL SOAJ injection Inject 0.3 mLs (0.3 mg total) into the muscle once. 10/24/12   Lonie Peak Dixon, PA-C  polyethylene glycol (MIRALAX / GLYCOLAX) packet Take 17 g by mouth daily as needed for  moderate constipation. 08/06/13   Barton Dubois, MD    Allergies:   Allergies  Allergen Reactions  . Black Pepper [[place]] Anaphylaxis  . Tomato Anaphylaxis    Has epi pen    Social History:  Ambulatory  [place]  Lives at home  With family     reports that she has been smoking Cigarettes.  She has a 9.5 pack-year smoking history. She has never used smokeless tobacco. She reports that she drinks alcohol. She reports that she does not use illicit drugs.    Family History: family history includes Heart attack in her father and mother.    Physical Exam: Patient Vitals for the past 24 hrs:  BP Temp Temp src Pulse Resp SpO2  08/16/13 0326 121/67 mmHg - - - 18 -  08/16/13 0311 - - - - - 98 %  08/16/13 0116 125/64 mmHg 97.8 F (36.6 C) Oral 60 24 97 %  08/16/13 0115 125/64 mmHg - - 58 22 97 %    1. General:  in No Acute distress 2. Psychological: Alert and  Oriented 3. Head/ENT:   Moist  Mucous Membranes  Head Non traumatic, neck supple                           Poor Dentition 4. SKIN: normal   Skin turgor,  Skin clean Dry and intact no rash 5. Heart: Regular rate and rhythm no Murmur, Rub or gallop 6. Lungs:  some wheezes,  diffuse crackles   7. Abdomen: Soft, non-tender, Non distended 8. Lower extremities: no clubbing, cyanosis, or edema 9. Neurologically Grossly intact, moving all 4 extremities equally 10. MSK: Normal range of motion  body mass index is unknown because there is no weight on file.   Labs on Admission:   Recent Labs  08/15/13 0945 08/15/13 1345 08/15/13 1345 08/16/13 0145  NA 138  --   --  137  K 4.4  --   --  3.6*  CL  --   --   --  97  CO2 28  --   --  28  GLUCOSE 107  --   --  126*  BUN 11.7  --   --  11  CREATININE 0.8  --   --  0.75  CALCIUM 10.7*  --   --  10.2  MG  --  2.0  --   --   PHOS  --   --  4.1  --     Recent Labs  08/15/13 0945  AST 26  ALT 12  ALKPHOS 132  BILITOT 0.38  PROT 7.2  ALBUMIN 2.4*    No results found for this basename: LIPASE, AMYLASE,  in the last 72 hours  Recent Labs  08/15/13 0945 08/16/13 0145  WBC 9.8 12.6*  NEUTROABS 8.0* 10.3*  HGB 12.9 12.3  HCT 39.6 36.6  MCV 98.0 98.4  PLT 368 375    Recent Labs  08/16/13 0145  TROPONINI <0.30   No results found for this basename: TSH, T4TOTAL, FREET3, T3FREE, THYROIDAB,  in the last 72 hours No results found for this basename: VITAMINB12, FOLATE, FERRITIN, TIBC, IRON, RETICCTPCT,  in the last 72 hours No results found for this basename: HGBA1C    The CrCl is unknown because both a height and weight (above a minimum accepted value) are required for this calculation. ABG No results found for this basename: phart, pco2, po2, hco3, tco2, acidbasedef, o2sat     No results found for this basename: DDIMER     Other results:  I have pearsonaly reviewed this: ECG REPORT  Rate:95  Rhythm: irregular possible MAT ST&T Change: non-specific ST segment changes   BNP (last 3 results)  Recent Labs  07/30/13 1644 08/16/13 0147  PROBNP 2688.0* 381.6*    There were no vitals filed for this visit.   Cultures:    Component Value Date/Time   SDES SPUTUM 07/31/2013 0906   SPECREQUEST NONE 07/31/2013 8182   CULT  Value: NO GROWTH 5 DAYS Performed at [facility] 07/30/2013 2209   REPTSTATUS 07/31/2013 FINAL 07/31/2013 0906         Radiological Exams on Admission: Dg Chest 2 View  08/16/2013   CLINICAL DATA:  Shortness of breath and productive cough. Recently diagnosed pneumonia. History of smoking and lung cancer.  EXAM: CHEST  2 VIEW  COMPARISON:  Chest radiograph performed 07/30/2013, and PET/CT performed 08/08/2013  FINDINGS: The patient's large left lower lobe lung mass is again noted, with underlying postobstructive consolidation, raising concern for persistent pneumonia. Vascular congestion is again seen. Bilateral bulky hilar lymphadenopathy is  again noted. A small left pleural effusion is  seen. Known right-sided pulmonary nodules are difficult to fully characterize on radiograph. There is no evidence of pneumothorax.  The cardiomediastinal silhouette is mildly enlarged. No acute osseous abnormalities are identified. There is chronic superior subluxation of the right humeral head.  IMPRESSION: 1. Persistent postobstructive consolidation at the left lung base, with a large left lower lobe lung mass again seen. Small left pleural effusion noted. Underlying vascular congestion again seen. 2. Bilateral bulky hilar lymphadenopathy again noted; mild cardiomegaly. Known right-sided pulmonary nodules are less characterized on radiograph.   Electronically Signed   By: Garald Balding M.D.   On: 08/16/2013 02:43    Chart has been reviewed  Assessment/Plan 73 year old female history of small cell lung cancer here with acute respiratory distress evidence of vascular congestion likely multifactorial combination of CHF exacerbation, COPD exacerbation and tumor  Present on Admission:  . Acute respiratory failure - likely multifactorial combination of CHF and or COPD exacerbation. Patient has large mass with history of small cell lung cancer likely contributing to dyspnea. She had a negative CT angiogram 2 weeks ago. Denies any chest pain. Will monitor and step down obtain ABG  . COPD with acute exacerbation - COPD component likely playing a role. Only showed she is on nebulizer treatments. Hold off on steroids until infection is ruled out at this point wheezing is minimal her symptoms improved after Lasix  . Hypertension - continue home medication  . Small cell lung cancer - will need to let oncology in the morning no but the patient  . Tobacco abuse - counseling provided  . CHF exacerbation - cycle cardiac enzymes obtain echo gram and serial EKG   Prophylaxis:  Lovenox, Protonix  CODE STATUS:  FULL CODE according to patient  Other plan as per orders.  I have spent a total of 65 min on this  admission extra callus taking due to complexity and acute illness  Brielynn Sekula 08/16/2013, 4:06 AM  [facility]  Pager [phone]   If 7AM-7PM, please contact the day team taking care of the patient  [email or web address]  Password [email or web address]

## 2013-08-16 NOTE — Patient Instructions (Signed)
Smoking Cessation Quitting smoking is important to your health and has many advantages. However, it is not always easy to quit since nicotine is a very addictive drug. Often times, people try 3 times or more before being able to quit. This document explains the best ways for you to prepare to quit smoking. Quitting takes hard work and a lot of effort, but you can do it. ADVANTAGES OF QUITTING SMOKING  You will live longer, feel better, and live better.  Your body will feel the impact of quitting smoking almost immediately.  Within 20 minutes, blood pressure decreases. Your pulse returns to its normal level.  After 8 hours, carbon monoxide levels in the blood return to normal. Your oxygen level increases.  After 24 hours, the chance of having a heart attack starts to decrease. Your breath, hair, and body stop smelling like smoke.  After 48 hours, damaged nerve endings begin to recover. Your sense of taste and smell improve.  After 72 hours, the body is virtually free of nicotine. Your bronchial tubes relax and breathing becomes easier.  After 2 to 12 weeks, lungs can hold more air. Exercise becomes easier and circulation improves.  The risk of having a heart attack, stroke, cancer, or lung disease is greatly reduced.  After 1 year, the risk of coronary heart disease is cut in half.  After 5 years, the risk of stroke falls to the same as a nonsmoker.  After 10 years, the risk of lung cancer is cut in half and the risk of other cancers decreases significantly.  After 15 years, the risk of coronary heart disease drops, usually to the level of a nonsmoker.  If you are pregnant, quitting smoking will improve your chances of having a healthy baby.  The people you live with, especially any children, will be healthier.  You will have extra money to spend on things other than cigarettes. QUESTIONS TO THINK ABOUT BEFORE ATTEMPTING TO QUIT You may want to talk about your answers with your  caregiver.  Why do you want to quit?  If you tried to quit in the past, what helped and what did not?  What will be the most difficult situations for you after you quit? How will you plan to handle them?  Who can help you through the tough times? Your family? Friends? A caregiver?  What pleasures do you get from smoking? What ways can you still get pleasure if you quit? Here are some questions to ask your caregiver:  How can you help me to be successful at quitting?  What medicine do you think would be best for me and how should I take it?  What should I do if I need more help?  What is smoking withdrawal like? How can I get information on withdrawal? GET READY  Set a quit date.  Change your environment by getting rid of all cigarettes, ashtrays, matches, and lighters in your home, car, or work. Do not let people smoke in your home.  Review your past attempts to quit. Think about what worked and what did not. GET SUPPORT AND ENCOURAGEMENT You have a better chance of being successful if you have help. You can get support in many ways.  Tell your family, friends, and co-workers that you are going to quit and need their support. Ask them not to smoke around you.  Get individual, group, or telephone counseling and support. Programs are available at local hospitals and health centers. Call your local health department for   information about programs in your area.  Spiritual beliefs and practices may help some smokers quit.  Download a "quit meter" on your computer to keep track of quit statistics, such as how long you have gone without smoking, cigarettes not smoked, and money saved.  Get a self-help book about quitting smoking and staying off of tobacco. LEARN NEW SKILLS AND BEHAVIORS  Distract yourself from urges to smoke. Talk to someone, go for a walk, or occupy your time with a task.  Change your normal routine. Take a different route to work. Drink tea instead of coffee.  Eat breakfast in a different place.  Reduce your stress. Take a hot bath, exercise, or read a book.  Plan something enjoyable to do every day. Reward yourself for not smoking.  Explore interactive web-based programs that specialize in helping you quit. GET MEDICINE AND USE IT CORRECTLY Medicines can help you stop smoking and decrease the urge to smoke. Combining medicine with the above behavioral methods and support can greatly increase your chances of successfully quitting smoking.  Nicotine replacement therapy helps deliver nicotine to your body without the negative effects and risks of smoking. Nicotine replacement therapy includes nicotine gum, lozenges, inhalers, nasal sprays, and skin patches. Some may be available over-the-counter and others require a prescription.  Antidepressant medicine helps people abstain from smoking, but how this works is unknown. This medicine is available by prescription.  Nicotinic receptor partial agonist medicine simulates the effect of nicotine in your brain. This medicine is available by prescription. Ask your caregiver for advice about which medicines to use and how to use them based on your health history. Your caregiver will tell you what side effects to look out for if you choose to be on a medicine or therapy. Carefully read the information on the package. Do not use any other product containing nicotine while using a nicotine replacement product.  RELAPSE OR DIFFICULT SITUATIONS Most relapses occur within the first 3 months after quitting. Do not be discouraged if you start smoking again. Remember, most people try several times before finally quitting. You may have symptoms of withdrawal because your body is used to nicotine. You may crave cigarettes, be irritable, feel very hungry, cough often, get headaches, or have difficulty concentrating. The withdrawal symptoms are only temporary. They are strongest when you first quit, but they will go away within  10 14 days. To reduce the chances of relapse, try to:  Avoid drinking alcohol. Drinking lowers your chances of successfully quitting.  Reduce the amount of caffeine you consume. Once you quit smoking, the amount of caffeine in your body increases and can give you symptoms, such as a rapid heartbeat, sweating, and anxiety.  Avoid smokers because they can make you want to smoke.  Do not let weight gain distract you. Many smokers will gain weight when they quit, usually less than 10 pounds. Eat a healthy diet and stay active. You can always lose the weight gained after you quit.  Find ways to improve your mood other than smoking. FOR MORE INFORMATION  www.smokefree.gov  Document Released: 02/14/2001 Document Revised: 08/22/2011 Document Reviewed: 06/01/2011 [employer] Patient Information 2014 [employer].  

## 2013-08-17 LAB — BASIC METABOLIC PANEL
BUN: 15 mg/dL (ref 6–23)
CALCIUM: 10.2 mg/dL (ref 8.4–10.5)
CO2: 29 meq/L (ref 19–32)
Chloride: 93 mEq/L — ABNORMAL LOW (ref 96–112)
Creatinine, Ser: 0.93 mg/dL (ref 0.50–1.10)
GFR calc Af Amer: 68 mL/min — ABNORMAL LOW (ref >90)
GFR, EST NON AFRICAN AMERICAN: 59 mL/min — AB (ref >90)
GLUCOSE: 131 mg/dL — AB (ref 70–99)
POTASSIUM: 4.6 meq/L (ref 3.7–5.3)
SODIUM: 135 meq/L — AB (ref 137–147)

## 2013-08-17 LAB — LEGIONELLA ANTIGEN, URINE: Legionella Antigen, Urine: NEGATIVE

## 2013-08-17 LAB — TROPONIN I
Troponin I: <0.3 ng/mL (ref <0.30)
Troponin I: <0.3 ng/mL (ref <0.30)

## 2013-08-17 MED ORDER — CALCIUM CARBONATE ANTACID 500 MG PO CHEW: 1.0000 | CHEWABLE_TABLET | Freq: Once | ORAL | Status: AC | PRN

## 2013-08-17 MED ORDER — NITROGLYCERIN 0.4 MG SL SUBL
SUBLINGUAL_TABLET | SUBLINGUAL | Status: AC
  Filled 2013-08-17: qty 1

## 2013-08-17 MED ORDER — MORPHINE SULFATE 2 MG/ML IJ SOLN
INTRAMUSCULAR | Status: AC
  Filled 2013-08-17: qty 1

## 2013-08-17 NOTE — Progress Notes (Signed)
Recd pt from ICU, A&O, no c/o.

## 2013-08-17 NOTE — Progress Notes (Signed)
Report called to Horris Latino, RN for transfer to [facility].

## 2013-08-17 NOTE — Progress Notes (Signed)
[facility] progress note. Chief complaint. Chest pain. History of present illness. This 73 year old female in hospital with COPD exacerbation. Her only cardiac history is atrial flutter with RVR. It should change the patient complains of chest pain. An EKG was obtained and I reviewed this at bedside. It indicates atrial flutter with rate controlled in the 90s. She no indication of acute ischemia. The patient indicates the pain originated from her epigastric area. There is no radiation of the pain but she does indicate there was associated diaphoresis and nausea. Vital signs. Temperature 98.3, pulse 112, respiration 20, blood pressure 138/73. O2 sats 97%. General appearance. Frail elderly female who is alert and in no distress. Cardiac. Rate and rhythm regular with occasional irregular beats. Lungs. Breath sounds are somewhat bronchial in the bases but otherwise clear. Abdomen. Patient has pain with palpation over the epigastric area. No guarding or rebound tenderness. Impression/plan. Problem #1. Chest pain. I suspect this more likely GI in origin than actual cardiac pain given exam findings and patient description. Nonetheless, I will obtain troponin now and then every 6 hours for a total of 3 sets. I will repeat an EKG in 6 hours to evaluate for any changes.

## 2013-08-17 NOTE — Progress Notes (Signed)
[facility] PROGRESS NOTE  Emma Douglas [id number]:[id number] DOB: [birth year] DOA: 08/16/2013 PCP: Dena Billet BETH, PA-C  Assessment/Plan:   Acute respiratory failure with hypoxia - Improved after Lasix administration, albuterol, Dulera and Atrovent. - Please see COPD with acute exacerbation plan below.  Active Problems:   Tobacco abuse - Recommend cessation    Hypertension - Relatively well controlled on Cardizem, lisinopril    COPD with acute exacerbation - Will continue bronchodilators, Dulera, and Zosyn - We'll plan on [facility] antibiotic coverage and most likely transition to Levaquin next a.m. 08/18/2048     Small cell lung cancer - Patient to followup with Dr. Julien Nordmann after discharge for routine evaluation recommendations.    CHF exacerbation - Improved after Lasix administration. - We'll continue Lasix 40 mg by mouth daily - monitor daily weights  Code Status: full code Family Communication: no family at bedside. Disposition Plan: Pending improvement in condition.   Consultants:  None  Procedures:  None  Antibiotics:  Zosyn  HPI/Subjective: Patient states she feels much better and is inquiring about discharge planning. Objective: Filed Vitals:   08/17/13 0800  BP:   Pulse:   Temp: 97.2 F (36.2 C)  Resp:     Intake/Output Summary (Last 24 hours) at 08/17/13 1103 Last data filed at 08/17/13 0617  Gross per 24 hour  Intake    350 ml  Output    495 ml  Net   -145 ml   Filed Weights   08/16/13 0530 08/17/13 0400  Weight: 46.5 kg (102 lb 8.2 oz) 43.8 kg (96 lb 9 oz)    Exam:   General:  Patient in no acute distress, alert and away  Cardiovascular: Irregularly irregular, no murmurs  Respiratory: No wheezes, decreased breath sounds at bases, breath sounds bilaterally  Abdomen: Soft, nondistended, nontender  Musculoskeletal: No cyanosis or clubbing   Data Reviewed: Basic Metabolic Panel:  Recent Labs Lab 08/15/13 0945  08/15/13 1345 08/15/13 1345 08/16/13 0145 08/16/13 0720 08/17/13 0340  NA 138  --   --  137 137 135*  K 4.4  --   --  3.6* 3.3* 4.6  CL  --   --   --  97 94* 93*  CO2 28  --   --  28 28 29   GLUCOSE 107  --   --  126* 162* 131*  BUN 11.7  --   --  11 10 15   CREATININE 0.8  --   --  0.75 0.70 0.93  CALCIUM 10.7*  --   --  10.2 9.9 10.2  MG  --  2.0  --   --  1.6  --   PHOS  --   --  4.1  --   --   --    Liver Function Tests:  Recent Labs Lab 08/15/13 0945 08/16/13 0720  AST 26 25  ALT 12 11  ALKPHOS 132 129*  BILITOT 0.38 0.2*  PROT 7.2 7.3  ALBUMIN 2.4* 2.5*   No results found for this basename: LIPASE, AMYLASE,  in the last 168 hours No results found for this basename: AMMONIA,  in the last 168 hours CBC:  Recent Labs Lab 08/15/13 0945 08/16/13 0145 08/16/13 0720  WBC 9.8 12.6* 10.3  NEUTROABS 8.0* 10.3* 9.3*  HGB 12.9 12.3 11.4*  HCT 39.6 36.6 35.0*  MCV 98.0 98.4 95.9  PLT 368 375 424*   Cardiac Enzymes:  Recent Labs Lab 08/16/13 0145 08/16/13 0416 08/16/13 1020 08/16/13 1554 08/17/13 0738  TROPONINI <0.30 <0.30 <  0.30 <0.30 <0.30   BNP (last 3 results)  Recent Labs  07/30/13 1644 08/16/13 0147  PROBNP 2688.0* 381.6*   CBG: No results found for this basename: GLUCAP,  in the last 168 hours  No results found for this or any previous visit (from the past 240 hour(s)).   Studies: Dg Chest 2 View  08/16/2013   CLINICAL DATA:  Shortness of breath and productive cough. Recently diagnosed pneumonia. History of smoking and lung cancer.  EXAM: CHEST  2 VIEW  COMPARISON:  Chest radiograph performed 07/30/2013, and PET/CT performed 08/08/2013  FINDINGS: The patient's large left lower lobe lung mass is again noted, with underlying postobstructive consolidation, raising concern for persistent pneumonia. Vascular congestion is again seen. Bilateral bulky hilar lymphadenopathy is again noted. A small left pleural effusion is seen. Known right-sided pulmonary  nodules are difficult to fully characterize on radiograph. There is no evidence of pneumothorax.  The cardiomediastinal silhouette is mildly enlarged. No acute osseous abnormalities are identified. There is chronic superior subluxation of the right humeral head.  IMPRESSION: 1. Persistent postobstructive consolidation at the left lung base, with a large left lower lobe lung mass again seen. Small left pleural effusion noted. Underlying vascular congestion again seen. 2. Bilateral bulky hilar lymphadenopathy again noted; mild cardiomegaly. Known right-sided pulmonary nodules are less characterized on radiograph.   Electronically Signed   By: Garald Balding M.D.   On: 08/16/2013 02:43    Scheduled Meds: . antiseptic oral rinse  15 mL Mouth Rinse BID  . aspirin EC  81 mg Oral Daily  . atorvastatin  20 mg Oral QHS  . diltiazem  60 mg Oral 3 times per day  . docusate sodium  200 mg Oral QHS  . enoxaparin (LOVENOX) injection  40 mg Subcutaneous Q24H  . furosemide  40 mg Oral Daily  . guaiFENesin  600 mg Oral BID  . ipratropium  0.5 mg Nebulization TID  . levothyroxine  100 mcg Oral QAC breakfast  . lisinopril  10 mg Oral Daily  . mometasone-formoterol  2 puff Inhalation BID  . pantoprazole  40 mg Oral Q1200  . piperacillin-tazobactam (ZOSYN)  IV  3.375 g Intravenous 3 times per day  . sodium chloride  3 mL Intravenous Q12H   Continuous Infusions:    Time spent: > 35 minutes    Velvet Bathe  [facility] Pager [phone] If 7PM-7AM, please contact night-coverage at [email or web address], password [facility] 08/17/2013, 11:03 AM  LOS: 1 day

## 2013-08-18 ENCOUNTER — Telehealth: Payer: Self-pay | Admitting: *Deleted

## 2013-08-18 DIAGNOSIS — C3430 Malignant neoplasm of lower lobe, unspecified bronchus or lung: Secondary | ICD-10-CM

## 2013-08-18 DIAGNOSIS — C787 Secondary malignant neoplasm of liver and intrahepatic bile duct: Secondary | ICD-10-CM

## 2013-08-18 DIAGNOSIS — C7951 Secondary malignant neoplasm of bone: Secondary | ICD-10-CM

## 2013-08-18 DIAGNOSIS — C7952 Secondary malignant neoplasm of bone marrow: Secondary | ICD-10-CM

## 2013-08-18 DIAGNOSIS — R0989 Other specified symptoms and signs involving the circulatory and respiratory systems: Secondary | ICD-10-CM

## 2013-08-18 DIAGNOSIS — R0609 Other forms of dyspnea: Secondary | ICD-10-CM

## 2013-08-18 LAB — BASIC METABOLIC PANEL
BUN: 17 mg/dL (ref 6–23)
CHLORIDE: 91 meq/L — AB (ref 96–112)
CO2: 31 mEq/L (ref 19–32)
CREATININE: 0.9 mg/dL (ref 0.50–1.10)
Calcium: 10 mg/dL (ref 8.4–10.5)
GFR, EST AFRICAN AMERICAN: 71 mL/min — AB (ref >90)
GFR, EST NON AFRICAN AMERICAN: 61 mL/min — AB (ref >90)
Glucose, Bld: 94 mg/dL (ref 70–99)
Potassium: 3.7 mEq/L (ref 3.7–5.3)
Sodium: 136 mEq/L — ABNORMAL LOW (ref 137–147)

## 2013-08-18 MED ORDER — DILTIAZEM HCL 60 MG PO TABS
60.0000 mg | ORAL_TABLET | Freq: Four times a day (QID) | ORAL | Status: DC
  Filled 2013-08-18 (×4): qty 1

## 2013-08-18 MED ORDER — ENSURE COMPLETE PO LIQD
237.0000 mL | Freq: Two times a day (BID) | ORAL | Status: DC
  Administered 2013-08-18: 237 mL via ORAL

## 2013-08-18 MED ORDER — DILTIAZEM HCL 60 MG PO TABS
60.0000 mg | ORAL_TABLET | Freq: Three times a day (TID) | ORAL | Status: DC
  Administered 2013-08-18 – 2013-08-19 (×4): 60 mg via ORAL
  Filled 2013-08-18 (×6): qty 1

## 2013-08-18 MED ORDER — NICOTINE 14 MG/24HR TD PT24
14.0000 mg | MEDICATED_PATCH | Freq: Every day | TRANSDERMAL | Status: DC
  Administered 2013-08-18 – 2013-08-19 (×2): 14 mg via TRANSDERMAL
  Filled 2013-08-18 (×2): qty 1

## 2013-08-18 MED ORDER — TIOTROPIUM BROMIDE MONOHYDRATE 18 MCG IN CAPS
18.0000 ug | ORAL_CAPSULE | Freq: Every day | RESPIRATORY_TRACT | Status: DC
  Administered 2013-08-18 – 2013-08-19 (×2): 18 ug via RESPIRATORY_TRACT
  Filled 2013-08-18 (×2): qty 5

## 2013-08-18 MED ORDER — POTASSIUM CHLORIDE CRYS ER 20 MEQ PO TBCR
40.0000 meq | EXTENDED_RELEASE_TABLET | Freq: Once | ORAL | Status: AC
  Administered 2013-08-18: 40 meq via ORAL
  Filled 2013-08-18: qty 2

## 2013-08-18 NOTE — Progress Notes (Addendum)
Talked to patient with daughter present about DCP; Patient has Pneumonia, COPD, CHF, at fib/flutter; patient needs a Disease Management Program to help the patient manage her disease process at home and or look for ways to improve her care at home; Patient is agreeable to [facility]; Nat Math with [employer] called for arrangements; Attending MD at discharge please order HHC/ Disease Management Program for CHF/ COPD and a rolater ( a rolling walker with a seat). Mindi Slicker RN,BSN,MHA [phone]

## 2013-08-18 NOTE — Progress Notes (Signed)
[facility] PROGRESS NOTE  Emma Douglas MVE:[id number] DOB: 1940/08/28 DOA: 08/16/2013 PCP: Dena Billet BETH, PA-C  Assessment/Plan:   Acute respiratory failure with hypoxia - Improved after Lasix administration, albuterol, Dulera and Atrovent. - Please see COPD with acute exacerbation plan below.  Active Problems:   Tobacco abuse - Recommend cessation  Atrial Flutter - Reportedly heart rate has gone as high as 130's, Question is whether the nicotine withdrawal is playing an effect. - Will try nicotine replacement via transdermal route.  If heart rate stays well controlled then will plan on discharging next am. Otherwise may have to increase patient's cardizem.    Hypertension - Relatively well controlled on Cardizem, lisinopril    COPD with acute exacerbation - Will continue bronchodilators, Dulera, and Zosyn. Discontinue atrovent and place order for spiriva - We'll plan on [facility] antibiotic coverage and most likely transition to Levaquin next a.m. 08/18/2048     Small cell lung cancer - Patient to followup with Dr. Julien Nordmann after discharge for routine evaluation recommendations.    CHF exacerbation - Improved after Lasix administration. - We'll continue Lasix 40 mg by mouth daily - monitor daily weights  Tobacco abuse - smokes 1/2 ppd - add nicotine patch  Code Status: full code Family Communication: no family at bedside. Disposition Plan: Pending improvement in condition.   Consultants:  None  Procedures:  None  Antibiotics:  Zosyn  HPI/Subjective: Patient has no new complaints  Objective: Filed Vitals:   08/18/13 0449  BP: 115/64  Pulse: 59  Temp: 97.1 F (36.2 C)  Resp: 20    Intake/Output Summary (Last 24 hours) at 08/18/13 1207 Last data filed at 08/18/13 0947  Gross per 24 hour  Intake    723 ml  Output   1325 ml  Net   -602 ml   Filed Weights   08/16/13 0530 08/17/13 0400 08/18/13 0449  Weight: 46.5 kg (102 lb 8.2 oz) 43.8 kg  (96 lb 9 oz) 42.865 kg (94 lb 8 oz)    Exam:   General:  Patient in no acute distress, alert and away  Cardiovascular: Irregularly irregular, no murmurs  Respiratory: No wheezes, decreased breath sounds at bases, breath sounds bilaterally  Abdomen: Soft, nondistended, nontender  Musculoskeletal: No cyanosis or clubbing   Data Reviewed: Basic Metabolic Panel:  Recent Labs Lab 08/15/13 0945 08/15/13 1345 08/15/13 1345 08/16/13 0145 08/16/13 0720 08/17/13 0340 08/18/13 0340  NA 138  --   --  137 137 135* 136*  K 4.4  --   --  3.6* 3.3* 4.6 3.7  CL  --   --   --  97 94* 93* 91*  CO2 28  --   --  28 28 29 31   GLUCOSE 107  --   --  126* 162* 131* 94  BUN 11.7  --   --  11 10 15 17   CREATININE 0.8  --   --  0.75 0.70 0.93 0.90  CALCIUM 10.7*  --   --  10.2 9.9 10.2 10.0  MG  --  2.0  --   --  1.6  --   --   PHOS  --   --  4.1  --   --   --   --    Liver Function Tests:  Recent Labs Lab 08/15/13 0945 08/16/13 0720  AST 26 25  ALT 12 11  ALKPHOS 132 129*  BILITOT 0.38 0.2*  PROT 7.2 7.3  ALBUMIN 2.4* 2.5*   No results found  for this basename: LIPASE, AMYLASE,  in the last 168 hours No results found for this basename: AMMONIA,  in the last 168 hours CBC:  Recent Labs Lab 08/15/13 0945 08/16/13 0145 08/16/13 0720  WBC 9.8 12.6* 10.3  NEUTROABS 8.0* 10.3* 9.3*  HGB 12.9 12.3 11.4*  HCT 39.6 36.6 35.0*  MCV 98.0 98.4 95.9  PLT 368 375 424*   Cardiac Enzymes:  Recent Labs Lab 08/16/13 1020 08/16/13 1554 08/17/13 0738 08/17/13 1310 08/17/13 1910  TROPONINI <0.30 <0.30 <0.30 <0.30 <0.30   BNP (last 3 results)  Recent Labs  07/30/13 1644 08/16/13 0147  PROBNP 2688.0* 381.6*   CBG: No results found for this basename: GLUCAP,  in the last 168 hours  Recent Results (from the past 240 hour(s))  CULTURE, BLOOD (ROUTINE X 2)     Status: None   Collection Time    08/16/13  7:05 AM      Result Value Ref Range Status   Specimen Description BLOOD  RIGHT ARM   Final   Special Requests BOTTLES DRAWN AEROBIC ONLY 5CC   Final   Culture  Setup Time     Final   Value: 08/16/2013 15:48     Performed at [facility]   Culture     Final   Value:        BLOOD CULTURE RECEIVED NO GROWTH TO DATE CULTURE WILL BE HELD FOR 5 DAYS BEFORE ISSUING A FINAL NEGATIVE REPORT     Performed at [facility]   Report Status PENDING   Incomplete  CULTURE, BLOOD (ROUTINE X 2)     Status: None   Collection Time    08/16/13  7:20 AM      Result Value Ref Range Status   Specimen Description BLOOD RIGHT ARM   Final   Special Requests BOTTLES DRAWN AEROBIC ONLY 2CC   Final   Culture  Setup Time     Final   Value: 08/16/2013 15:48     Performed at [facility]   Culture     Final   Value:        BLOOD CULTURE RECEIVED NO GROWTH TO DATE CULTURE WILL BE HELD FOR 5 DAYS BEFORE ISSUING A FINAL NEGATIVE REPORT     Performed at [facility]   Report Status PENDING   Incomplete     Studies: No results found.  Scheduled Meds: . antiseptic oral rinse  15 mL Mouth Rinse BID  . aspirin EC  81 mg Oral Daily  . atorvastatin  20 mg Oral QHS  . diltiazem  60 mg Oral 3 times per day  . docusate sodium  200 mg Oral QHS  . enoxaparin (LOVENOX) injection  40 mg Subcutaneous Q24H  . furosemide  40 mg Oral Daily  . guaiFENesin  600 mg Oral BID  . ipratropium  0.5 mg Nebulization TID  . levothyroxine  100 mcg Oral QAC breakfast  . lisinopril  10 mg Oral Daily  . mometasone-formoterol  2 puff Inhalation BID  . nicotine  14 mg Transdermal Daily  . pantoprazole  40 mg Oral Q1200  . piperacillin-tazobactam (ZOSYN)  IV  3.375 g Intravenous 3 times per day  . sodium chloride  3 mL Intravenous Q12H   Continuous Infusions:    Time spent: > 35 minutes    Velvet Bathe  [facility] Pager [phone] If 7PM-7AM, please contact night-coverage at [email or web address], password [facility] 08/18/2013, 12:07 PM  LOS: 2 days

## 2013-08-18 NOTE — Telephone Encounter (Signed)
Called daughter with appt with Dr. Julien Nordmann 08/05/2013

## 2013-08-18 NOTE — Progress Notes (Signed)
Subjective: The patient is seen and examined today. She is feeling a little bit better but continues to have shortness of breath at baseline and increased with exertion. She was recently diagnosed with extensive stage small cell lung cancer and is expected to start systemic chemotherapy on a clinical trial very soon. She was recently admitted with worsening dyspnea most likely secondary to her disease. She denied having any significant fever or chills, no nausea or vomiting.  Objective: Vital signs in last 24 hours: Temp:  [97.1 F (36.2 C)-98.5 F (36.9 C)] 97.1 F (36.2 C) (06/15 0449) Pulse Rate:  [59-103] 59 (06/15 0449) Resp:  [16-28] 20 (06/15 0449) BP: (94-154)/(53-97) 115/64 mmHg (06/15 0449) SpO2:  [92 %-100 %] 97 % (06/15 0757) Weight:  [94 lb 8 oz (42.865 kg)] 94 lb 8 oz (42.865 kg) (06/15 0449)  Intake/Output from previous day: 06/14 0701 - 06/15 0700 In: 480 [P.O.:330; IV Piggyback:150] Out: 1625 [Urine:1625] Intake/Output this shift:    General appearance: alert, cooperative, fatigued and mild distress Resp: wheezes LLL and LUL Cardio: regular rate and rhythm, S1, S2 normal, no murmur, click, rub or gallop GI: soft, non-tender; bowel sounds normal; no masses,  no organomegaly Extremities: extremities normal, atraumatic, no cyanosis or edema  Lab Results:   Recent Labs  08/16/13 0145 08/16/13 0720  WBC 12.6* 10.3  HGB 12.3 11.4*  HCT 36.6 35.0*  PLT 375 424*   BMET  Recent Labs  08/17/13 0340 08/18/13 0340  NA 135* 136*  K 4.6 3.7  CL 93* 91*  CO2 29 31  GLUCOSE 131* 94  BUN 15 17  CREATININE 0.93 0.90  CALCIUM 10.2 10.0    Studies/Results: No results found.  Medications: I have reviewed the patient's current medications.  Assessment/Plan: This is a very pleasant 73 years old [place] American female recently diagnosed with extensive stage small cell lung cancer. She is currently evaluated for enrollment on the ECoG 2511 clinical trial where  the patient would be treated with cisplatin and etoposide plus/minus Veliparib. She is expected to start this treatment soon. She was admitted with worsening dyspnea secondary to her disease. She feels a little bit better today. Stable, she can be discharged home and I will arrange for the patient a followup visit to start her treatment within the next few days once the workup for the clinical trial is completed. She agreed to the current plan. Thank you so much for taking good care of Ms. Emma Douglas, I will continue to follow up the patient with you and assist in her management.  LOS: 2 days    Ellinor Test K. 08/18/2013

## 2013-08-18 NOTE — Progress Notes (Signed)
INITIAL NUTRITION ASSESSMENT  DOCUMENTATION CODES Per approved criteria  -Severe malnutrition in the context of chronic illness -Underweight  Pt meets criteria for severe MALNUTRITION in the context of chronic illness as evidenced by >5% body weight loss in >one month, severe muscle wasting and subcutaneous fat loss, PO intake <75% for > one month.   INTERVENTION: -Consider liberalizing to Regular diet to assist in nutrient replenishment -Encourage Ensure Complete po BID, each supplement provides 350 kcal and 13 grams of protein -Will continue to monitor  NUTRITION DIAGNOSIS: Increased nutrient needs (protein/kcal) related to increased demand for nutrients as evidenced by underweight BMI/chronic disease-lung cancer.   Goal: Pt to meet >/= 90% of their estimated nutrition needs    Monitor:  Pt to meet >/= 90% of their estimated nutrition needs    Reason for Assessment: MST/Underweight BMI  73 y.o. female  Admitting Dx: <principal problem not specified>  ASSESSMENT: Patient developed worsening wheezing and shortness of breath this evening. She denies any fever or chills no chest pain no worsening cough. Patietn has been recently diagnosed with small cell lung CA. She continues to smoke. Patient has hx of paroxysmal a.fib not on anticoagulation. She was found to be hypoxic down to 85% despite repeated nebulizer treatments remained tachypnic. CXR was worriosme for pulmonary congestion.   -Pt reported decreased appetite, eats 2 small meals/day. Breakfast consists of grits, and will eat a light lunch or dinner. Tries to drink 2 vanilla Ensure/day. Family encourages PO,but has shown little improvement since lung cancer dx -Endorsed weight loss, was unable to quantify amount lost or usual body weight. -Current PO intake 50% of breakfast, pt noted feelings of early satiety  -Denied n/v, has some dysphagia (requires meats chopped) but is able to tolerate regular diet textures Nutrition  Focused Physical Exam:  Subcutaneous Fat:  Orbital Region: WDL Upper Arm Region: severe wasting Thoracic and Lumbar Region: n/a  Muscle:  Temple Region: severe wasting Clavicle Bone Region: severe wasting Clavicle and Acromion Bone Region: severe wasting Scapular Bone Region: n/a Dorsal Hand: moderate wasting Patellar Region: severe wasting Anterior Thigh Region: severe wasting Posterior Calf Region: severe wasting  Edema: none noted    Height: Ht Readings from Last 1 Encounters:  08/16/13 5\' 8"  (1.727 m)    Weight: Wt Readings from Last 1 Encounters:  08/18/13 94 lb 8 oz (42.865 kg)    Ideal Body Weight: 140 lbs  % Ideal Body Weight: 67%  Wt Readings from Last 10 Encounters:  08/18/13 94 lb 8 oz (42.865 kg)  08/15/13 101 lb 11.2 oz (46.131 kg)  08/11/13 103 lb 8 oz (46.947 kg)  08/06/13 100 lb 15.5 oz (45.8 kg)  08/06/13 100 lb 15.5 oz (45.8 kg)  08/06/13 100 lb 15.5 oz (45.8 kg)  08/06/13 100 lb 15.5 oz (45.8 kg)  07/30/13 103 lb (46.72 kg)  07/29/13 102 lb 12.8 oz (46.63 kg)  07/09/13 108 lb (48.988 kg)    Usual Body Weight: 100-105 lbs per previous medical records  % Usual Body Weight: approximately 94%  BMI:  Body mass index is 14.37 kg/(m^2). Underweight  Estimated Nutritional Needs: Kcal: 1500-1700 Protein: 65-80 gram Fluid: >/=1500 ml/daily  Skin: WDL  Diet Order: Cardiac  EDUCATION NEEDS: -No education needs identified at this time   Intake/Output Summary (Last 24 hours) at 08/18/13 1225 Last data filed at 08/18/13 0947  Gross per 24 hour  Intake    723 ml  Output   1325 ml  Net   -602 ml  Last BM: 6/15   Labs:   Recent Labs Lab 08/15/13 1345 08/15/13 1345  08/16/13 0720 08/17/13 0340 08/18/13 0340  NA  --   --   < > 137 135* 136*  K  --   --   < > 3.3* 4.6 3.7  CL  --   --   < > 94* 93* 91*  CO2  --   --   < > 28 29 31   BUN  --   --   < > 10 15 17   CREATININE  --   --   < > 0.70 0.93 0.90  CALCIUM  --   --   < >  9.9 10.2 10.0  MG 2.0  --   --  1.6  --   --   PHOS  --  4.1  --   --   --   --   GLUCOSE  --   --   < > 162* 131* 94  < > = values in this interval not displayed.  CBG (last 3)  No results found for this basename: GLUCAP,  in the last 72 hours  Scheduled Meds: . antiseptic oral rinse  15 mL Mouth Rinse BID  . aspirin EC  81 mg Oral Daily  . atorvastatin  20 mg Oral QHS  . diltiazem  60 mg Oral 3 times per day  . docusate sodium  200 mg Oral QHS  . enoxaparin (LOVENOX) injection  40 mg Subcutaneous Q24H  . furosemide  40 mg Oral Daily  . guaiFENesin  600 mg Oral BID  . levothyroxine  100 mcg Oral QAC breakfast  . lisinopril  10 mg Oral Daily  . mometasone-formoterol  2 puff Inhalation BID  . nicotine  14 mg Transdermal Daily  . pantoprazole  40 mg Oral Q1200  . piperacillin-tazobactam (ZOSYN)  IV  3.375 g Intravenous 3 times per day  . sodium chloride  3 mL Intravenous Q12H  . tiotropium  18 mcg Inhalation Daily    Continuous Infusions:   Past Medical History  Diagnosis Date  . Compensated hypothyroidism   . Essential hypertension   . Dyslipidemia   . Colon polyps   . Anxiety   . COPD (chronic obstructive pulmonary disease)     secondary to smoking.  . Avascular necrosis     right femoral head  . Hyperlipidemia   . Anemia      Postoperative hemorrhagic anemia  . Allergic reaction 10/24/2012  . Arthritis     "hands" (07/31/2013)  . Lung mass     /CT 07/11/2013  . Mass of stomach     /CT 07/11/2013  . CAP (community acquired pneumonia) 07/30/2013    "first time I've ever had pneumonia"  . Atrial flutter with rapid ventricular response     Archie Endo 07/31/2013  . Subdural hemorrhage following injury 2013    fall/notes 07/30/2013    Past Surgical History  Procedure Laterality Date  . Colonoscopy      with polypectomy and biopsy  . Total hip arthroplasty Right 2002  . Dilation and curettage of uterus    . Tubal ligation    . Esophagogastroduodenoscopy (egd) with  propofol N/A 08/01/2013    Procedure: ESOPHAGOGASTRODUODENOSCOPY (EGD) WITH PROPOFOL;  Surgeon: Jeryl Columbia, MD;  Location: [facility] ENDOSCOPY;  Service: Endoscopy;  Laterality: N/A;  . Video bronchoscopy Bilateral 08/04/2013    Procedure: VIDEO BRONCHOSCOPY WITH FLUORO;  Surgeon: Elsie Stain, MD;  Location: [place];  Service: Cardiopulmonary;  Laterality: Bilateral;  . Video bronchoscopy N/A 08/04/2013    Procedure: VIDEO BRONCHOSCOPY WITH FLUORO;  Surgeon: Elsie Stain, MD;  Location: [place];  Service: Cardiopulmonary;  Laterality: N/A;    Atlee Abide MS RD LDN Clinical Dietitian [phone]

## 2013-08-18 NOTE — Progress Notes (Signed)
Tonight, when patient gets up to get on the [facility], her HR goes up into the 150's and then comes back down once she is back in the bed. Will continue to monitor.

## 2013-08-19 ENCOUNTER — Other Ambulatory Visit: Payer: Medicare Other

## 2013-08-19 ENCOUNTER — Other Ambulatory Visit: Payer: Self-pay | Admitting: *Deleted

## 2013-08-19 DIAGNOSIS — C3490 Malignant neoplasm of unspecified part of unspecified bronchus or lung: Secondary | ICD-10-CM

## 2013-08-19 DIAGNOSIS — E43 Unspecified severe protein-calorie malnutrition: Secondary | ICD-10-CM | POA: Insufficient documentation

## 2013-08-19 LAB — BASIC METABOLIC PANEL
BUN: 16 mg/dL (ref 6–23)
CALCIUM: 9.9 mg/dL (ref 8.4–10.5)
CO2: 27 mEq/L (ref 19–32)
CREATININE: 0.86 mg/dL (ref 0.50–1.10)
Chloride: 93 mEq/L — ABNORMAL LOW (ref 96–112)
GFR, EST AFRICAN AMERICAN: 75 mL/min — AB (ref >90)
GFR, EST NON AFRICAN AMERICAN: 65 mL/min — AB (ref >90)
Glucose, Bld: 100 mg/dL — ABNORMAL HIGH (ref 70–99)
Potassium: 5 mEq/L (ref 3.7–5.3)
Sodium: 136 mEq/L — ABNORMAL LOW (ref 137–147)

## 2013-08-19 MED ORDER — FUROSEMIDE 40 MG PO TABS: 40.0000 mg | ORAL_TABLET | Freq: Every day | ORAL | Status: AC

## 2013-08-19 MED ORDER — ASPIRIN 81 MG PO TBEC: 81.0000 mg | DELAYED_RELEASE_TABLET | Freq: Every day | ORAL | Status: DC

## 2013-08-19 MED ORDER — NICOTINE 14 MG/24HR TD PT24: 14.0000 mg | MEDICATED_PATCH | Freq: Every day | TRANSDERMAL | Status: AC

## 2013-08-19 NOTE — Progress Notes (Signed)
SATURATION QUALIFICATIONS: (This note is used to comply with regulatory documentation for home oxygen)  Patient Saturations on Room Air at Rest = 90%  Patient Saturations on Room Air while Ambulating = 83%  Patient Saturations on 3 Liters of oxygen while Ambulating = 95%  Please briefly explain why patient needs home oxygen:  patient saturations drop with activity

## 2013-08-19 NOTE — Discharge Summary (Signed)
Physician Discharge Summary  Emma Douglas GHW:[id number] DOB: December 13, 1940 DOA: 08/16/2013  PCP: Karis Juba, PA-C  Admit date: 08/16/2013 Discharge date: 08/19/2013  Time spent: > 35 minutes  Recommendations for Outpatient Follow-up:  1. Please be sure to follow up with your oncologist and PCP within the next 1 week 2. I do not believe that SOB was 2ary to infectious etiology as such will d/c abx on discharge.  Discharge Diagnoses:  Active Problems:   Tobacco abuse   Hypertension   Atrial flutter with rapid ventricular response   COPD with acute exacerbation   Acute respiratory failure   Small cell lung cancer   CHF exacerbation   Acute respiratory failure with hypoxia   Protein-calorie malnutrition, severe   Discharge Condition: stable  Diet recommendation: Regular diet  Filed Weights   08/17/13 0400 08/18/13 0449 08/19/13 0526  Weight: 43.8 kg (96 lb 9 oz) 42.865 kg (94 lb 8 oz) 43.772 kg (96 lb 8 oz)    History of present illness:  Pt is 73 y/o with h/o hypothyroidism, Essential hypertension; Dyslipidemia; Colon polyps; Anxiety; COPD (chronic obstructive pulmonary disease); Avascular necrosis; Hyperlipidemia; Anemia; Allergic reaction (10/24/2012); Arthritis; Lung mass; Mass of stomach. Presented to the ED complaining of SOB.   Hospital Course:  Acute respiratory failure with hypoxia  - Improved after Lasix administration, albuterol, Dulera and Atrovent.  - Please see COPD with acute exacerbation plan below.   Active Problems:  Tobacco abuse  - Recommend cessation  - added nicotine patch  Protein-calorie malnutrition, severe - liberalized diet - While in house encouraged ensure complete po BID, which will be resumed on discharge. - RD consulted and reported the following: Pt meets criteria for severe MALNUTRITION in the context of chronic illness as evidenced by >5% body weight loss in >one month, severe muscle wasting and subcutaneous fat loss, PO intake  <75% for > one month.  Atrial Flutter  - Well controlled on home regimen.  Rate improved after nicotine patch was placed. Suspect that anxiety from cravings may have been exacerbating rate.  Hypertension  - Relatively well controlled on Cardizem, lisinopril   COPD with acute exacerbation  - Will continue home regimen bronchodilators.  Do not suspect infectious etiology given patient was afebrile, with normal WBC count on last check, and was not coughing up sputum. As such will not discharge on antibiotics.  - No wheezes and patient's respiratory status improved most noticeably after lasix administration. As such will not d/c on prednisone.  Small cell lung cancer  - Patient to followup with Dr. Julien Nordmann after discharge for routine evaluation recommendations.   CHF exacerbation  - Improved after Lasix administration.  - We'll continue Lasix 40 mg by mouth daily   Tobacco abuse  - smokes 1/2 ppd  - add nicotine patch and provide prescription on discharge.   Procedures:  None  Consultations:  Oncology: Dr. Julien Nordmann  Discharge Exam: Filed Vitals:   08/19/13 0526  BP: 130/61  Pulse: 86  Temp: 98 F (36.7 C)  Resp: 16    General: Pt in NAD, alert and awake Cardiovascular: irregularly irregular, no murmurs Respiratory: prolonged expiratory phase, no wheezes   Discharge Instructions You were cared for by a hospitalist during your hospital stay. If you have any questions about your discharge medications or the care you received while you were in the hospital after you are discharged, you can call the unit and asked to speak with the hospitalist on call if the hospitalist that took care of  you is not available. Once you are discharged, your primary care physician will handle any further medical issues. Please note that NO REFILLS for any discharge medications will be authorized once you are discharged, as it is imperative that you return to your primary care physician (or establish  a relationship with a primary care physician if you do not have one) for your aftercare needs so that they can reassess your need for medications and monitor your lab values.  Discharge Instructions   Call MD for:  difficulty breathing, headache or visual disturbances    Complete by:  As directed      Call MD for:  temperature >100.4    Complete by:  As directed      Diet - low sodium heart healthy    Complete by:  As directed      Discharge instructions    Complete by:  As directed   Discharge home with home health and f/u with Oncologist     Increase activity slowly    Complete by:  As directed             Medication List         aspirin 81 MG EC tablet  Take 1 tablet (81 mg total) by mouth daily.     atorvastatin 20 MG tablet  Commonly known as:  LIPITOR  Take 1 tablet (20 mg total) by mouth at bedtime.     diltiazem 60 MG tablet  Commonly known as:  CARDIZEM  Take 1 tablet (60 mg total) by mouth every 8 (eight) hours.     DSS 100 MG Caps  Take 200 mg by mouth at bedtime.     ENSURE  Take 237 mLs by mouth 3 (three) times daily between meals.     EPINEPHrine 0.3 mg/0.3 mL Soaj injection  Commonly known as:  EPI-PEN  Inject 0.3 mLs (0.3 mg total) into the muscle once.     furosemide 40 MG tablet  Commonly known as:  LASIX  Take 1 tablet (40 mg total) by mouth daily.     Ipratropium-Albuterol 20-100 MCG/ACT Aers respimat  Commonly known as:  COMBIVENT  Inhale 1 puff into the lungs every 6 (six) hours.     levothyroxine 100 MCG tablet  Commonly known as:  SYNTHROID, LEVOTHROID  Take 100 mcg by mouth daily before breakfast.     lisinopril 10 MG tablet  Commonly known as:  PRINIVIL,ZESTRIL  Take 1 tablet (10 mg total) by mouth daily.     multivitamin tablet  Take 1 tablet by mouth daily.     nicotine 14 mg/24hr patch  Commonly known as:  NICODERM CQ - dosed in mg/24 hours  Place 1 patch (14 mg total) onto the skin daily.     polyethylene glycol packet   Commonly known as:  MIRALAX / GLYCOLAX  Take 17 g by mouth daily as needed for moderate constipation.     prochlorperazine 10 MG tablet  Commonly known as:  COMPAZINE  Take 1 tablet (10 mg total) by mouth every 6 (six) hours as needed for nausea or vomiting.     traMADol 50 MG tablet  Commonly known as:  ULTRAM  Take 50 mg by mouth every 6 (six) hours as needed for moderate pain.       Allergies  Allergen Reactions  . Black Pepper [[place]] Anaphylaxis  . Tomato Anaphylaxis    Has epi pen      The results of significant diagnostics from  this hospitalization (including imaging, microbiology, ancillary and laboratory) are listed below for reference.    Significant Diagnostic Studies: Dg Chest 2 View  08/16/2013   CLINICAL DATA:  Shortness of breath and productive cough. Recently diagnosed pneumonia. History of smoking and lung cancer.  EXAM: CHEST  2 VIEW  COMPARISON:  Chest radiograph performed 07/30/2013, and PET/CT performed 08/08/2013  FINDINGS: The patient's large left lower lobe lung mass is again noted, with underlying postobstructive consolidation, raising concern for persistent pneumonia. Vascular congestion is again seen. Bilateral bulky hilar lymphadenopathy is again noted. A small left pleural effusion is seen. Known right-sided pulmonary nodules are difficult to fully characterize on radiograph. There is no evidence of pneumothorax.  The cardiomediastinal silhouette is mildly enlarged. No acute osseous abnormalities are identified. There is chronic superior subluxation of the right humeral head.  IMPRESSION: 1. Persistent postobstructive consolidation at the left lung base, with a large left lower lobe lung mass again seen. Small left pleural effusion noted. Underlying vascular congestion again seen. 2. Bilateral bulky hilar lymphadenopathy again noted; mild cardiomegaly. Known right-sided pulmonary nodules are less characterized on radiograph.   Electronically Signed   By:  Garald Balding M.D.   On: 08/16/2013 02:43   Ct Angio Chest Pe W/cm &/or Wo Cm  07/30/2013   CLINICAL DATA:  Shortness of breath and cough.  Atrial flutter.  EXAM: CT ANGIOGRAPHY CHEST WITH CONTRAST  TECHNIQUE: Multidetector CT imaging of the chest was performed using the standard protocol during bolus administration of intravenous contrast. Multiplanar CT image reconstructions and MIPs were obtained to evaluate the vascular anatomy.  CONTRAST:  164mL OMNIPAQUE IOHEXOL 350 MG/ML SOLN  COMPARISON:  Chest x-ray 07/30/2013 CT scan dated 07/11/2013  FINDINGS: There are no definitive pulmonary emboli. There has been marked progression of mediastinal tumor as well as the hilar tumor masses as well as the tumor in the lingula. There is extensive spread of tumor into the left lower lobe. Tumor encases and occludes peripheral arteries in the left lower lobe. There is now occlusion of the right middle lobe bronchus and of the left lower lobe bronchus with almost complete occlusion of the left upper lobe bronchus and with marked narrowing of the right lower lobe bronchus.  There is new hazy infiltrate in the inferior aspect of the right upper lobe. There is new loculated pleural fluid posterior to the left upper lobe.  The tumor compresses the pulmonary arteries bilaterally. The tumor extends posterior to the left atrium and has a mass effect upon the left atrium.  No acute osseous abnormalities.  Review of the MIP images confirms the above findings.  IMPRESSION: 1. Marked progression of mediastinal and hilar tumor with with new extensive tumor extension into the left lower lobe and enlargement of the mass in the lingula. The small irregular mass in the right upper lobe is unchanged.  2. New obstruction of the left lower lobe bronchus, near obstruction of the left upper lobe bronchus, complete obstruction of the right middle lobe bronchus, and severe narrowing of the right lower lobe bronchus.   Electronically Signed   By:  Rozetta Nunnery M.D.   On: 07/30/2013 20:46   Mr Brain Wo Contrast  08/08/2013   CLINICAL DATA:  Lung mass. Gastric mass. Assess for metastatic disease. Patient refused contrast.  EXAM: MRI HEAD WITHOUT CONTRAST  TECHNIQUE: Multiplanar, multiecho pulse sequences of the brain and surrounding structures were obtained without intravenous contrast.  COMPARISON:  Head CT 10/29/2011  FINDINGS: As noted above,  the patient would not allow administration of intravenous contrast. This limits detection of small metastatic lesions.  That said, there is no finding to suggest the presence of metastatic disease. The brainstem is normal. There is cerebellar atrophy with a few old small vessel cerebellar infarctions. Cerebral hemispheres show ordinary chronic small-vessel changes affecting the deep and subcortical white matter. In the pass, the patient had subdural hematoma is an there are very tiny frontal hygroma as. No evidence of recurrent subdural hematoma. No hydrocephalus. No pituitary mass. No fluid in the sinuses, middle ears or mastoids. Major vessels are patent. No evidence of skull or skullbase lesion.  IMPRESSION: The patient refused contrast administration. This limits the detection of small metastatic lesions. There is no sign of large lesion. There is no finding to suggest any metastatic disease.  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 No evidence of recent bleeding.  Chronic small vessel change of the hemispheric white matter.   Electronically Signed   By: Nelson Chimes M.D.   On: 08/08/2013 17:02   Nm Pet Image Initial (pi) Skull Base To Thigh  08/08/2013   CLINICAL DATA:  Initial treatment strategy for lung mass.  EXAM: NUCLEAR MEDICINE PET SKULL BASE TO THIGH  TECHNIQUE: 8.6 mCi F-18 FDG was injected intravenously. Full-ring PET imaging was performed from the skull base to thigh after the radiotracer. CT data was obtained and used for attenuation correction and anatomic localization.   FASTING BLOOD GLUCOSE:  Value: 115 mg/dl  COMPARISON:  CT 07/11/2013  FINDINGS: NECK  There is a hypermetabolic right supraclavicular lymph node measuring 13 mm short axis (image 51, series 4) with SUV max 7.5.  CHEST  There is a hypermetabolic right lower lobe mass measuring 4.0 cm with SUV max 5.7.  The bulky hypermetabolic left hilar mass measuring 4.5 cm with SUV max equals 7.1. This obstructs the left lower lobe. There is hypermetabolic prevascular and right hilar lymph nodes. There is hypermetabolic enlarged right paratracheal nodes.  Within the right upper lobe there is a hypermetabolic nodule measuring 12 mm (image 23, series 8) which is hypermetabolic. There is hypermetabolic consolidation in the right upper ( image 32).  ABDOMEN/PELVIS  There is a hypermetabolic focus within the left hepatic lobe (image 126) which is indeterminate but suspicious. There is an enlarged hypermetabolic periportal lymph node with SUV max 8.1.  SKELETON  There is widespread skeletal metastasis. There is intense hypermetabolic lesion in the posterior right iliac bone with associated sclerosis and with SUV max = 5.0. There 2 hypermetabolic foci within the inferior sacrum. Several foci within the lumbar spine.  IMPRESSION: 1. Hypermetabolic right supraclavicular nodal metastasis. 2. Extensive bilateral hypermetabolic nodal metastasis. Left hilar nodal conglomerate reaches up to 5 cm with collapse of the left lower lobe. 3. Hypermetabolic left lower lobe pulmonary mass. This could represent primary tumor. 4. Hypermetabolic metastasis within the right upper lobe. 5. Concern for single hypermetabolic metastasis within the left hepatic lobe. 6. Hypermetabolic periportal lymph node metastasis. 7. Hypermetabolic skeletal metastasis with the greatest density of lesions in the pelvis and lumbar spine.   Electronically Signed   By: Suzy Bouchard M.D.   On: 08/08/2013 16:28   Dg Chest Port 1 View  07/30/2013   CLINICAL DATA:  Cough,  wheezing  EXAM: PORTABLE CHEST - 1 VIEW  COMPARISON:  07/11/2013 and earlier studies  FINDINGS: Bilateral hilar adenopathy. Progressive airspace consolidation in the basilar segments left lower lobe. New interstitial opacities around the left hilum and  in the right infrahilar region. Probable left pleural effusion. . Atheromatous aorta.  Heart size upper limits normal. Degenerative changes in bilateral shoulders.  IMPRESSION: 1. Progressive consolidation/atelectasis in the left lower lung, with probable small effusion. 2. Persistent bilateral hilar adenopathy.   Electronically Signed   By: Arne Cleveland M.D.   On: 07/30/2013 18:43   Dg C-arm Bronchoscopy  08/04/2013   CLINICAL DATA: bronch procedure   C-ARM BRONCHOSCOPY  Fluoroscopy was utilized by the requesting physician.  No radiographic  interpretation.     Microbiology: Recent Results (from the past 240 hour(s))  CULTURE, BLOOD (ROUTINE X 2)     Status: None   Collection Time    08/16/13  7:05 AM      Result Value Ref Range Status   Specimen Description BLOOD RIGHT ARM   Final   Special Requests BOTTLES DRAWN AEROBIC ONLY 5CC   Final   Culture  Setup Time     Final   Value: 08/16/2013 15:48     Performed at [facility]   Culture     Final   Value:        BLOOD CULTURE RECEIVED NO GROWTH TO DATE CULTURE WILL BE HELD FOR 5 DAYS BEFORE ISSUING A FINAL NEGATIVE REPORT     Performed at [facility]   Report Status PENDING   Incomplete  CULTURE, BLOOD (ROUTINE X 2)     Status: None   Collection Time    08/16/13  7:20 AM      Result Value Ref Range Status   Specimen Description BLOOD RIGHT ARM   Final   Special Requests BOTTLES DRAWN AEROBIC ONLY 2CC   Final   Culture  Setup Time     Final   Value: 08/16/2013 15:48     Performed at [facility]   Culture     Final   Value:        BLOOD CULTURE RECEIVED NO GROWTH TO DATE CULTURE WILL BE HELD FOR 5 DAYS BEFORE ISSUING A FINAL NEGATIVE REPORT     Performed at  [facility]   Report Status PENDING   Incomplete     Labs: Basic Metabolic Panel:  Recent Labs Lab 08/15/13 1345 08/15/13 1345 08/16/13 0145 08/16/13 0720 08/17/13 0340 08/18/13 0340 08/19/13 0354  NA  --   --  137 137 135* 136* 136*  K  --   --  3.6* 3.3* 4.6 3.7 5.0  CL  --   --  97 94* 93* 91* 93*  CO2  --   --  28 28 29 31 27   GLUCOSE  --   --  126* 162* 131* 94 100*  BUN  --   --  11 10 15 17 16   CREATININE  --   --  0.75 0.70 0.93 0.90 0.86  CALCIUM  --   --  10.2 9.9 10.2 10.0 9.9  MG 2.0  --   --  1.6  --   --   --   PHOS  --  4.1  --   --   --   --   --    Liver Function Tests:  Recent Labs Lab 08/15/13 0945 08/16/13 0720  AST 26 25  ALT 12 11  ALKPHOS 132 129*  BILITOT 0.38 0.2*  PROT 7.2 7.3  ALBUMIN 2.4* 2.5*   No results found for this basename: LIPASE, AMYLASE,  in the last 168 hours No results found for this basename:  AMMONIA,  in the last 168 hours CBC:  Recent Labs Lab 08/15/13 0945 08/16/13 0145 08/16/13 0720  WBC 9.8 12.6* 10.3  NEUTROABS 8.0* 10.3* 9.3*  HGB 12.9 12.3 11.4*  HCT 39.6 36.6 35.0*  MCV 98.0 98.4 95.9  PLT 368 375 424*   Cardiac Enzymes:  Recent Labs Lab 08/16/13 1020 08/16/13 1554 08/17/13 0738 08/17/13 1310 08/17/13 1910  TROPONINI <0.30 <0.30 <0.30 <0.30 <0.30   BNP: BNP (last 3 results)  Recent Labs  07/30/13 1644 08/16/13 0147  PROBNP 2688.0* 381.6*   CBG: No results found for this basename: GLUCAP,  in the last 168 hours     Signed:  Velvet Bathe  [facility] 08/19/2013, 10:37 AM

## 2013-08-19 NOTE — Clinical Documentation Improvement (Signed)
  DS states "CHF exacerbation". In the Coding world this term is considered nonspecific and low weighted. Please clarify possible/likely/susptected "CHF" to reflect the severity of illness and risk of mortality for this patient.   Possible Clinical Conditions? - Acute Systolic Congestive Heart Failure - Acute Diastolic Congestive Heart Failure - Acute Systolic & Diastolic Congestive Heart Failure - Other Condition   Thank You, Ezekiel Ina ,RN Clinical Documentation Specialist:  [phone]  [place] Information Management

## 2013-08-19 NOTE — Care Management Note (Signed)
    Page 1 of 2   08/19/2013     1:33:18 PM CARE MANAGEMENT NOTE 08/19/2013  Patient:  Emma Douglas, Emma Douglas   Account Number:  [id number]  Date Initiated:  08/17/2013  Documentation initiated by:  [facility]  Subjective/Objective Assessment:   Acute respiratory failure with hypoxia     Action/Plan:   FROM HOME.   Anticipated DC Date:  08/19/2013   Anticipated DC Plan:  [place]  CM consult      Choice offered to / List presented to:  C-1 Patient   DME arranged  [facility]      DME agency  [place] arranged  HH-1 RN  [place].   Status of service:  Completed, signed off Medicare Important Message given?  YES (If response is "NO", the following Medicare IM given date fields will be blank) Date Medicare IM given:  08/19/2013 Date Additional Medicare IM given:    Discharge Disposition:  [place]  Per UR Regulation:  Reviewed for med. necessity/level of care/duration of stay  If discussed at Long Length of Stay Meetings, dates discussed:    Comments:  08/19/13 KATHY MAHABIR RN,BSN NCM [phone] PATIENT ALREADY HAS RW SINCE 1013 PER [facility] DOES NOT QUALIFY FOR ROLATER UNLESS OUT OF POCKET EXPENSE(PATIENT STATES SHE CANNOT AFFORD TO PAY).PATIENT WILL CONTINUE W/CURRENT RW.MD UPDATED.QUALIFIED FOR HOME 02,HOME 02 PROVIDED BY [facility] DME REP,BROUGHT TO RM.[facility] KRISTEN AWARE OF D/C & HHRN/PT.NO FURTHER D/C NEEDS.

## 2013-08-20 ENCOUNTER — Ambulatory Visit: Payer: Medicare Other

## 2013-08-20 ENCOUNTER — Telehealth: Payer: Self-pay | Admitting: *Deleted

## 2013-08-20 ENCOUNTER — Other Ambulatory Visit: Payer: Medicare Other

## 2013-08-20 NOTE — Telephone Encounter (Signed)
I have changed the appt on 7/8 to longer time due to treatment plan,. Patient aware and will pick up calendar on Monday.   JMW

## 2013-08-21 ENCOUNTER — Telehealth: Payer: Self-pay | Admitting: Medical Oncology

## 2013-08-21 ENCOUNTER — Ambulatory Visit: Payer: Medicare Other

## 2013-08-21 NOTE — Telephone Encounter (Signed)
Pt. cannot tolerate nasal cannula for oxygen delivery. Will send rx per [facility].Marland Kitchen Rx faxed to [facility].

## 2013-08-22 ENCOUNTER — Ambulatory Visit: Payer: Medicare Other

## 2013-08-22 LAB — CULTURE, BLOOD (ROUTINE X 2)
Culture: NO GROWTH
Culture: NO GROWTH

## 2013-08-23 ENCOUNTER — Ambulatory Visit: Payer: Medicare Other

## 2013-08-25 ENCOUNTER — Other Ambulatory Visit (HOSPITAL_BASED_OUTPATIENT_CLINIC_OR_DEPARTMENT_OTHER): Payer: Medicare Other

## 2013-08-25 ENCOUNTER — Emergency Department (HOSPITAL_COMMUNITY): Payer: Medicare Other

## 2013-08-25 ENCOUNTER — Telehealth (HOSPITAL_BASED_OUTPATIENT_CLINIC_OR_DEPARTMENT_OTHER): Payer: Self-pay | Admitting: Emergency Medicine

## 2013-08-25 ENCOUNTER — Inpatient Hospital Stay (HOSPITAL_COMMUNITY): Payer: Medicare Other

## 2013-08-25 ENCOUNTER — Encounter (HOSPITAL_COMMUNITY): Payer: Self-pay | Admitting: Emergency Medicine

## 2013-08-25 ENCOUNTER — Encounter: Payer: Medicare Other | Admitting: *Deleted

## 2013-08-25 ENCOUNTER — Inpatient Hospital Stay (HOSPITAL_COMMUNITY)
Admission: EM | Admit: 2013-08-25 | Discharge: 2013-09-03 | DRG: 871 | Disposition: E | Payer: Medicare Other | Attending: Pulmonary Disease | Admitting: Pulmonary Disease

## 2013-08-25 ENCOUNTER — Ambulatory Visit (HOSPITAL_BASED_OUTPATIENT_CLINIC_OR_DEPARTMENT_OTHER): Payer: Medicare Other | Admitting: Internal Medicine

## 2013-08-25 ENCOUNTER — Other Ambulatory Visit: Payer: Self-pay

## 2013-08-25 DIAGNOSIS — J449 Chronic obstructive pulmonary disease, unspecified: Secondary | ICD-10-CM | POA: Diagnosis present

## 2013-08-25 DIAGNOSIS — N179 Acute kidney failure, unspecified: Secondary | ICD-10-CM

## 2013-08-25 DIAGNOSIS — E870 Hyperosmolality and hypernatremia: Secondary | ICD-10-CM | POA: Diagnosis present

## 2013-08-25 DIAGNOSIS — E872 Acidosis, unspecified: Secondary | ICD-10-CM | POA: Diagnosis present

## 2013-08-25 DIAGNOSIS — R55 Syncope and collapse: Secondary | ICD-10-CM

## 2013-08-25 DIAGNOSIS — C50919 Malignant neoplasm of unspecified site of unspecified female breast: Secondary | ICD-10-CM

## 2013-08-25 DIAGNOSIS — I10 Essential (primary) hypertension: Secondary | ICD-10-CM | POA: Diagnosis present

## 2013-08-25 DIAGNOSIS — R6520 Severe sepsis without septic shock: Secondary | ICD-10-CM | POA: Diagnosis present

## 2013-08-25 DIAGNOSIS — X31XXXA Exposure to excessive natural cold, initial encounter: Secondary | ICD-10-CM | POA: Diagnosis present

## 2013-08-25 DIAGNOSIS — E860 Dehydration: Secondary | ICD-10-CM | POA: Diagnosis present

## 2013-08-25 DIAGNOSIS — C787 Secondary malignant neoplasm of liver and intrahepatic bile duct: Secondary | ICD-10-CM | POA: Diagnosis present

## 2013-08-25 DIAGNOSIS — E039 Hypothyroidism, unspecified: Secondary | ICD-10-CM | POA: Diagnosis present

## 2013-08-25 DIAGNOSIS — D649 Anemia, unspecified: Secondary | ICD-10-CM | POA: Diagnosis present

## 2013-08-25 DIAGNOSIS — R64 Cachexia: Secondary | ICD-10-CM | POA: Diagnosis present

## 2013-08-25 DIAGNOSIS — R197 Diarrhea, unspecified: Secondary | ICD-10-CM | POA: Diagnosis present

## 2013-08-25 DIAGNOSIS — J9601 Acute respiratory failure with hypoxia: Secondary | ICD-10-CM

## 2013-08-25 DIAGNOSIS — J910 Malignant pleural effusion: Secondary | ICD-10-CM | POA: Diagnosis present

## 2013-08-25 DIAGNOSIS — Z8249 Family history of ischemic heart disease and other diseases of the circulatory system: Secondary | ICD-10-CM

## 2013-08-25 DIAGNOSIS — I9589 Other hypotension: Secondary | ICD-10-CM

## 2013-08-25 DIAGNOSIS — E785 Hyperlipidemia, unspecified: Secondary | ICD-10-CM | POA: Diagnosis present

## 2013-08-25 DIAGNOSIS — Z515 Encounter for palliative care: Secondary | ICD-10-CM | POA: Diagnosis not present

## 2013-08-25 DIAGNOSIS — R7309 Other abnormal glucose: Secondary | ICD-10-CM | POA: Diagnosis present

## 2013-08-25 DIAGNOSIS — I509 Heart failure, unspecified: Secondary | ICD-10-CM

## 2013-08-25 DIAGNOSIS — Z860100 Personal history of colon polyps, unspecified: Secondary | ICD-10-CM

## 2013-08-25 DIAGNOSIS — J441 Chronic obstructive pulmonary disease with (acute) exacerbation: Secondary | ICD-10-CM

## 2013-08-25 DIAGNOSIS — A419 Sepsis, unspecified organism: Principal | ICD-10-CM

## 2013-08-25 DIAGNOSIS — Z79899 Other long term (current) drug therapy: Secondary | ICD-10-CM

## 2013-08-25 DIAGNOSIS — J4489 Other specified chronic obstructive pulmonary disease: Secondary | ICD-10-CM | POA: Diagnosis present

## 2013-08-25 DIAGNOSIS — C3490 Malignant neoplasm of unspecified part of unspecified bronchus or lung: Secondary | ICD-10-CM | POA: Diagnosis present

## 2013-08-25 DIAGNOSIS — I4892 Unspecified atrial flutter: Secondary | ICD-10-CM | POA: Diagnosis present

## 2013-08-25 DIAGNOSIS — Z7982 Long term (current) use of aspirin: Secondary | ICD-10-CM

## 2013-08-25 DIAGNOSIS — R6510 Systemic inflammatory response syndrome (SIRS) of non-infectious origin without acute organ dysfunction: Secondary | ICD-10-CM

## 2013-08-25 DIAGNOSIS — Z96649 Presence of unspecified artificial hip joint: Secondary | ICD-10-CM | POA: Diagnosis not present

## 2013-08-25 DIAGNOSIS — I4891 Unspecified atrial fibrillation: Secondary | ICD-10-CM | POA: Diagnosis present

## 2013-08-25 DIAGNOSIS — C7952 Secondary malignant neoplasm of bone marrow: Secondary | ICD-10-CM

## 2013-08-25 DIAGNOSIS — J9819 Other pulmonary collapse: Secondary | ICD-10-CM | POA: Diagnosis present

## 2013-08-25 DIAGNOSIS — J189 Pneumonia, unspecified organism: Secondary | ICD-10-CM | POA: Diagnosis present

## 2013-08-25 DIAGNOSIS — R6521 Severe sepsis with septic shock: Secondary | ICD-10-CM

## 2013-08-25 DIAGNOSIS — G9341 Metabolic encephalopathy: Secondary | ICD-10-CM | POA: Diagnosis present

## 2013-08-25 DIAGNOSIS — J9600 Acute respiratory failure, unspecified whether with hypoxia or hypercapnia: Secondary | ICD-10-CM | POA: Diagnosis present

## 2013-08-25 DIAGNOSIS — F17200 Nicotine dependence, unspecified, uncomplicated: Secondary | ICD-10-CM | POA: Diagnosis present

## 2013-08-25 DIAGNOSIS — C3430 Malignant neoplasm of lower lobe, unspecified bronchus or lung: Secondary | ICD-10-CM

## 2013-08-25 DIAGNOSIS — C3492 Malignant neoplasm of unspecified part of left bronchus or lung: Secondary | ICD-10-CM

## 2013-08-25 DIAGNOSIS — R590 Localized enlarged lymph nodes: Secondary | ICD-10-CM | POA: Diagnosis present

## 2013-08-25 DIAGNOSIS — C779 Secondary and unspecified malignant neoplasm of lymph node, unspecified: Secondary | ICD-10-CM

## 2013-08-25 DIAGNOSIS — Z66 Do not resuscitate: Secondary | ICD-10-CM | POA: Diagnosis present

## 2013-08-25 DIAGNOSIS — Z681 Body mass index (BMI) 19 or less, adult: Secondary | ICD-10-CM | POA: Diagnosis not present

## 2013-08-25 DIAGNOSIS — J90 Pleural effusion, not elsewhere classified: Secondary | ICD-10-CM

## 2013-08-25 DIAGNOSIS — C7951 Secondary malignant neoplasm of bone: Secondary | ICD-10-CM | POA: Diagnosis present

## 2013-08-25 DIAGNOSIS — E876 Hypokalemia: Secondary | ICD-10-CM | POA: Diagnosis present

## 2013-08-25 DIAGNOSIS — F411 Generalized anxiety disorder: Secondary | ICD-10-CM | POA: Diagnosis present

## 2013-08-25 DIAGNOSIS — I959 Hypotension, unspecified: Secondary | ICD-10-CM

## 2013-08-25 DIAGNOSIS — T68XXXA Hypothermia, initial encounter: Secondary | ICD-10-CM

## 2013-08-25 DIAGNOSIS — R7989 Other specified abnormal findings of blood chemistry: Secondary | ICD-10-CM | POA: Diagnosis present

## 2013-08-25 DIAGNOSIS — Z9981 Dependence on supplemental oxygen: Secondary | ICD-10-CM | POA: Diagnosis not present

## 2013-08-25 DIAGNOSIS — E43 Unspecified severe protein-calorie malnutrition: Secondary | ICD-10-CM

## 2013-08-25 DIAGNOSIS — G9340 Encephalopathy, unspecified: Secondary | ICD-10-CM

## 2013-08-25 DIAGNOSIS — J9811 Atelectasis: Secondary | ICD-10-CM

## 2013-08-25 LAB — CBC WITH DIFFERENTIAL/PLATELET
BASO%: 0.2 % (ref 0.0–2.0)
BASOS ABS: 0 10*3/uL (ref 0.0–0.1)
Basophils Absolute: 0 10*3/uL (ref 0.0–0.1)
Basophils Relative: 0 % (ref 0–1)
EOS%: 0.1 % (ref 0.0–7.0)
Eosinophils Absolute: 0 10*3/uL (ref 0.0–0.5)
Eosinophils Absolute: 0 10*3/uL (ref 0.0–0.7)
Eosinophils Relative: 0 % (ref 0–5)
HCT: 36 % (ref 34.8–46.6)
HCT: 32.8 % — ABNORMAL LOW (ref 36.0–46.0)
HGB: 11.5 g/dL — ABNORMAL LOW (ref 11.6–15.9)
Hemoglobin: 10.6 g/dL — ABNORMAL LOW (ref 12.0–15.0)
LYMPH%: 9.9 % — AB (ref 14.0–49.7)
LYMPHS ABS: 0.5 10*3/uL — AB (ref 0.7–4.0)
LYMPHS PCT: 4 % — AB (ref 12–46)
MCH: 31.3 pg (ref 25.1–34.0)
MCH: 31.3 pg (ref 26.0–34.0)
MCHC: 31.9 g/dL (ref 31.5–36.0)
MCHC: 32.3 g/dL (ref 30.0–36.0)
MCV: 98.1 fL (ref 79.5–101.0)
MCV: 96.8 fL (ref 78.0–100.0)
MONO ABS: 1 10*3/uL (ref 0.1–1.0)
MONO#: 1.4 10*3/uL — ABNORMAL HIGH (ref 0.1–0.9)
MONO%: 9.1 % (ref 0.0–14.0)
Monocytes Relative: 8 % (ref 3–12)
NEUT#: 12.1 10*3/uL — ABNORMAL HIGH (ref 1.5–6.5)
NEUT%: 80.7 % — ABNORMAL HIGH (ref 38.4–76.8)
NEUTROS ABS: 11.8 10*3/uL — AB (ref 1.7–7.7)
NRBC: 0 % (ref 0–0)
Neutrophils Relative %: 88 % — ABNORMAL HIGH (ref 43–77)
PLATELETS: 465 10*3/uL — AB (ref 145–400)
Platelets: 513 10*3/uL — ABNORMAL HIGH (ref 150–400)
RBC: 3.67 10*6/uL — AB (ref 3.70–5.45)
RBC: 3.39 MIL/uL — AB (ref 3.87–5.11)
RDW: 14.5 % (ref 11.2–14.5)
RDW: 14.3 % (ref 11.5–15.5)
WBC: 15 10*3/uL — ABNORMAL HIGH (ref 3.9–10.3)
WBC: 13.3 10*3/uL — ABNORMAL HIGH (ref 4.0–10.5)
lymph#: 1.5 10*3/uL (ref 0.9–3.3)

## 2013-08-25 LAB — COMPREHENSIVE METABOLIC PANEL (CC13)
ALT: 20 U/L (ref 0–55)
ANION GAP: 20 meq/L — AB (ref 3–11)
AST: 34 U/L (ref 5–34)
Albumin: 2.1 g/dL — ABNORMAL LOW (ref 3.5–5.0)
Alkaline Phosphatase: 115 U/L (ref 40–150)
BILIRUBIN TOTAL: 0.26 mg/dL (ref 0.20–1.20)
BUN: 89 mg/dL — ABNORMAL HIGH (ref 7.0–26.0)
CO2: 25 mEq/L (ref 22–29)
CREATININE: 4.2 mg/dL — AB (ref 0.6–1.1)
Calcium: 10.7 mg/dL — ABNORMAL HIGH (ref 8.4–10.4)
Chloride: 95 mEq/L — ABNORMAL LOW (ref 98–109)
Glucose: 135 mg/dl (ref 70–140)
Potassium: 4.3 mEq/L (ref 3.5–5.1)
Sodium: 140 mEq/L (ref 136–145)
Total Protein: 7.4 g/dL (ref 6.4–8.3)

## 2013-08-25 LAB — PROTIME-INR
INR: 1.05 (ref 0.00–1.49)
PROTHROMBIN TIME: 13.5 s (ref 11.6–15.2)

## 2013-08-25 LAB — I-STAT CHEM 8, ED
BUN: 97 mg/dL — ABNORMAL HIGH (ref 6–23)
CALCIUM ION: 1.11 mmol/L — AB (ref 1.13–1.30)
Chloride: 97 mEq/L (ref 96–112)
Creatinine, Ser: 4.5 mg/dL — ABNORMAL HIGH (ref 0.50–1.10)
Glucose, Bld: 119 mg/dL — ABNORMAL HIGH (ref 70–99)
HEMATOCRIT: 36 % (ref 36.0–46.0)
HEMOGLOBIN: 12.2 g/dL (ref 12.0–15.0)
POTASSIUM: 4 meq/L (ref 3.7–5.3)
Sodium: 137 mEq/L (ref 137–147)
TCO2: 23 mmol/L (ref 0–100)

## 2013-08-25 LAB — COMPREHENSIVE METABOLIC PANEL
ALT: 16 U/L (ref 0–35)
AST: 35 U/L (ref 0–37)
Albumin: 2.1 g/dL — ABNORMAL LOW (ref 3.5–5.2)
Alkaline Phosphatase: 106 U/L (ref 39–117)
BUN: 89 mg/dL — ABNORMAL HIGH (ref 6–23)
CO2: 23 meq/L (ref 19–32)
CREATININE: 4.02 mg/dL — AB (ref 0.50–1.10)
Calcium: 9.6 mg/dL (ref 8.4–10.5)
Chloride: 93 mEq/L — ABNORMAL LOW (ref 96–112)
GFR calc Af Amer: 12 mL/min — ABNORMAL LOW (ref >90)
GFR, EST NON AFRICAN AMERICAN: 10 mL/min — AB (ref >90)
GLUCOSE: 119 mg/dL — AB (ref 70–99)
Potassium: 4.2 mEq/L (ref 3.7–5.3)
Sodium: 140 mEq/L (ref 137–147)
Total Protein: 6.8 g/dL (ref 6.0–8.3)

## 2013-08-25 LAB — PROCALCITONIN: Procalcitonin: 1.05 ng/mL

## 2013-08-25 LAB — CBC
HEMATOCRIT: 35.2 % — AB (ref 36.0–46.0)
HEMOGLOBIN: 11.3 g/dL — AB (ref 12.0–15.0)
MCH: 31 pg (ref 26.0–34.0)
MCHC: 32.1 g/dL (ref 30.0–36.0)
MCV: 96.7 fL (ref 78.0–100.0)
Platelets: 508 10*3/uL — ABNORMAL HIGH (ref 150–400)
RBC: 3.64 MIL/uL — AB (ref 3.87–5.11)
RDW: 14.4 % (ref 11.5–15.5)
WBC: 14.6 10*3/uL — AB (ref 4.0–10.5)

## 2013-08-25 LAB — IRON AND TIBC
Iron: 20 ug/dL — ABNORMAL LOW (ref 42–135)
Saturation Ratios: 10 % — ABNORMAL LOW (ref 20–55)
TIBC: 201 ug/dL — ABNORMAL LOW (ref 250–470)
UIBC: 181 ug/dL (ref 125–400)

## 2013-08-25 LAB — TROPONIN I: Troponin I: <0.3 ng/mL (ref <0.30)

## 2013-08-25 LAB — URINALYSIS, ROUTINE W REFLEX MICROSCOPIC
BILIRUBIN URINE: NEGATIVE
Glucose, UA: NEGATIVE mg/dL
KETONES UR: NEGATIVE mg/dL
Leukocytes, UA: NEGATIVE
NITRITE: NEGATIVE
Protein, ur: NEGATIVE mg/dL
SPECIFIC GRAVITY, URINE: 1.015 (ref 1.005–1.030)
UROBILINOGEN UA: 0.2 mg/dL (ref 0.0–1.0)
pH: 5 (ref 5.0–8.0)

## 2013-08-25 LAB — CREATININE, SERUM
CREATININE: 3.26 mg/dL — AB (ref 0.50–1.10)
GFR calc Af Amer: 15 mL/min — ABNORMAL LOW (ref >90)
GFR, EST NON AFRICAN AMERICAN: 13 mL/min — AB (ref >90)

## 2013-08-25 LAB — MRSA PCR SCREENING: MRSA by PCR: NEGATIVE

## 2013-08-25 LAB — I-STAT CG4 LACTIC ACID, ED: LACTIC ACID, VENOUS: 4.85 mmol/L — AB (ref 0.5–2.2)

## 2013-08-25 LAB — URINE MICROSCOPIC-ADD ON

## 2013-08-25 LAB — D-DIMER, QUANTITATIVE (NOT AT ARMC): D-Dimer, Quant: 11.29 ug/mL-FEU — ABNORMAL HIGH (ref 0.00–0.48)

## 2013-08-25 LAB — I-STAT TROPONIN, ED: Troponin i, poc: 0.04 ng/mL (ref 0.00–0.08)

## 2013-08-25 LAB — APTT: APTT: 32 s (ref 24–37)

## 2013-08-25 LAB — LACTATE DEHYDROGENASE (CC13): LDH: 2057 U/L — ABNORMAL HIGH (ref 125–245)

## 2013-08-25 LAB — STREP PNEUMONIAE URINARY ANTIGEN: Strep Pneumo Urinary Antigen: NEGATIVE

## 2013-08-25 LAB — MAGNESIUM (CC13): Magnesium: 2.6 mg/dl — ABNORMAL HIGH (ref 1.5–2.5)

## 2013-08-25 LAB — MAGNESIUM: Magnesium: 2.4 mg/dL (ref 1.5–2.5)

## 2013-08-25 LAB — PHOSPHORUS: Phosphorus: 7.1 mg/dL — ABNORMAL HIGH (ref 2.3–4.6)

## 2013-08-25 LAB — TSH: TSH: 0.705 u[IU]/mL (ref 0.350–4.500)

## 2013-08-25 MED ORDER — ASPIRIN EC 81 MG PO TBEC: 81.0000 mg | DELAYED_RELEASE_TABLET | Freq: Every day | ORAL | Status: DC

## 2013-08-25 MED ORDER — ENSURE COMPLETE PO LIQD: 237.0000 mL | Freq: Three times a day (TID) | ORAL | Status: DC

## 2013-08-25 MED ORDER — DSS 100 MG PO CAPS: 200.0000 mg | ORAL_CAPSULE | Freq: Every evening | ORAL | Status: DC | PRN

## 2013-08-25 MED ORDER — ALBUTEROL SULFATE (2.5 MG/3ML) 0.083% IN NEBU
2.5000 mg | INHALATION_SOLUTION | RESPIRATORY_TRACT | Status: DC
  Administered 2013-08-26 – 2013-08-27 (×9): 2.5 mg via RESPIRATORY_TRACT
  Filled 2013-08-25 (×9): qty 3

## 2013-08-25 MED ORDER — MAGNESIUM CITRATE PO SOLN: 1.0000 | Freq: Once | ORAL | Status: AC | PRN

## 2013-08-25 MED ORDER — NOREPINEPHRINE BITARTRATE 1 MG/ML IV SOLN
2.0000 ug/min | INTRAVENOUS | Status: DC
  Administered 2013-08-25: 5 ug/min via INTRAVENOUS
  Filled 2013-08-25: qty 4

## 2013-08-25 MED ORDER — MIDAZOLAM HCL 2 MG/2ML IJ SOLN
1.0000 mg | INTRAMUSCULAR | Status: DC | PRN
  Administered 2013-08-26 (×3): 1 mg via INTRAVENOUS
  Filled 2013-08-25 (×2): qty 2

## 2013-08-25 MED ORDER — NICOTINE 14 MG/24HR TD PT24
14.0000 mg | MEDICATED_PATCH | Freq: Every day | TRANSDERMAL | Status: DC
  Administered 2013-08-26 – 2013-08-27 (×2): 14 mg via TRANSDERMAL
  Filled 2013-08-25 (×2): qty 1

## 2013-08-25 MED ORDER — VANCOMYCIN HCL 500 MG IV SOLR: 500.0000 mg | INTRAVENOUS | Status: DC

## 2013-08-25 MED ORDER — FENTANYL CITRATE 0.05 MG/ML IJ SOLN
INTRAMUSCULAR | Status: AC
  Administered 2013-08-25: 100 ug
  Filled 2013-08-25: qty 2

## 2013-08-25 MED ORDER — ACETAMINOPHEN 650 MG RE SUPP: 650.0000 mg | Freq: Four times a day (QID) | RECTAL | Status: DC | PRN

## 2013-08-25 MED ORDER — VANCOMYCIN HCL IN DEXTROSE 1-5 GM/200ML-% IV SOLN
1000.0000 mg | Freq: Once | INTRAVENOUS | Status: AC
  Administered 2013-08-25: 1000 mg via INTRAVENOUS
  Filled 2013-08-25: qty 200

## 2013-08-25 MED ORDER — PHENYLEPHRINE HCL 10 MG/ML IJ SOLN
30.0000 ug/min | INTRAVENOUS | Status: DC
  Administered 2013-08-25: 40 ug/min via INTRAVENOUS
  Filled 2013-08-25: qty 1

## 2013-08-25 MED ORDER — IPRATROPIUM BROMIDE 0.02 % IN SOLN
0.5000 mg | RESPIRATORY_TRACT | Status: DC
  Administered 2013-08-26 – 2013-08-27 (×9): 0.5 mg via RESPIRATORY_TRACT
  Filled 2013-08-25 (×9): qty 2.5

## 2013-08-25 MED ORDER — SODIUM CHLORIDE 0.9 % IV BOLUS (SEPSIS)
500.0000 mL | Freq: Once | INTRAVENOUS | Status: AC
  Administered 2013-08-25: 500 mL via INTRAVENOUS

## 2013-08-25 MED ORDER — DEXTROSE 5 % IV SOLN
5.0000 ug/min | INTRAVENOUS | Status: DC
  Filled 2013-08-25 (×2): qty 4

## 2013-08-25 MED ORDER — PIPERACILLIN-TAZOBACTAM 3.375 G IVPB 30 MIN
3.3750 g | Freq: Once | INTRAVENOUS | Status: AC
  Administered 2013-08-25: 3.375 g via INTRAVENOUS
  Filled 2013-08-25: qty 50

## 2013-08-25 MED ORDER — GI COCKTAIL ~~LOC~~
30.0000 mL | Freq: Three times a day (TID) | ORAL | Status: DC | PRN
  Filled 2013-08-25: qty 30

## 2013-08-25 MED ORDER — SODIUM CHLORIDE 0.9 % IV BOLUS (SEPSIS)
1000.0000 mL | Freq: Once | INTRAVENOUS | Status: AC
  Administered 2013-08-25: 1000 mL via INTRAVENOUS

## 2013-08-25 MED ORDER — CHLORHEXIDINE GLUCONATE 0.12 % MT SOLN
OROMUCOSAL | Status: AC
  Administered 2013-08-25: 15 mL
  Filled 2013-08-25: qty 15

## 2013-08-25 MED ORDER — PANTOPRAZOLE SODIUM 40 MG PO TBEC: 40.0000 mg | DELAYED_RELEASE_TABLET | Freq: Every day | ORAL | Status: DC

## 2013-08-25 MED ORDER — ACETAMINOPHEN 325 MG PO TABS: 650.0000 mg | ORAL_TABLET | Freq: Four times a day (QID) | ORAL | Status: DC | PRN

## 2013-08-25 MED ORDER — ALBUTEROL SULFATE (2.5 MG/3ML) 0.083% IN NEBU: 2.5000 mg | INHALATION_SOLUTION | RESPIRATORY_TRACT | Status: DC | PRN

## 2013-08-25 MED ORDER — IPRATROPIUM-ALBUTEROL 0.5-2.5 (3) MG/3ML IN SOLN: 3.0000 mL | Freq: Four times a day (QID) | RESPIRATORY_TRACT | Status: DC

## 2013-08-25 MED ORDER — DOCUSATE SODIUM 100 MG PO CAPS: 200.0000 mg | ORAL_CAPSULE | Freq: Every day | ORAL | Status: DC | PRN

## 2013-08-25 MED ORDER — SODIUM CHLORIDE 0.9 % IV BOLUS (SEPSIS): 1000.0000 mL | INTRAVENOUS | Status: DC | PRN

## 2013-08-25 MED ORDER — ONDANSETRON HCL 4 MG PO TABS: 4.0000 mg | ORAL_TABLET | Freq: Four times a day (QID) | ORAL | Status: DC | PRN

## 2013-08-25 MED ORDER — POLYETHYLENE GLYCOL 3350 17 G PO PACK: 17.0000 g | PACK | Freq: Every day | ORAL | Status: DC | PRN

## 2013-08-25 MED ORDER — SODIUM CHLORIDE 0.9 % IV SOLN
0.0000 ug/h | INTRAVENOUS | Status: DC
  Administered 2013-08-25: 25 ug/h via INTRAVENOUS
  Administered 2013-08-26: 150 ug/h via INTRAVENOUS
  Administered 2013-08-26 – 2013-08-27 (×2): 50 ug/h via INTRAVENOUS
  Administered 2013-08-28: 125 ug/h via INTRAVENOUS
  Filled 2013-08-25 (×4): qty 50

## 2013-08-25 MED ORDER — MIDAZOLAM HCL 2 MG/2ML IJ SOLN
1.0000 mg | INTRAMUSCULAR | Status: DC | PRN
  Filled 2013-08-25 (×2): qty 2

## 2013-08-25 MED ORDER — ONDANSETRON HCL 4 MG/2ML IJ SOLN: 4.0000 mg | Freq: Four times a day (QID) | INTRAMUSCULAR | Status: DC | PRN

## 2013-08-25 MED ORDER — SODIUM CHLORIDE 0.9 % IV SOLN
INTRAVENOUS | Status: DC
  Administered 2013-08-25: 17:00:00 via INTRAVENOUS

## 2013-08-25 MED ORDER — IPRATROPIUM-ALBUTEROL 20-100 MCG/ACT IN AERS: 1.0000 | INHALATION_SPRAY | Freq: Four times a day (QID) | RESPIRATORY_TRACT | Status: DC

## 2013-08-25 MED ORDER — IPRATROPIUM BROMIDE 0.02 % IN SOLN
0.5000 mg | RESPIRATORY_TRACT | Status: DC | PRN
Start: 2013-08-25 — End: 2013-08-27

## 2013-08-25 MED ORDER — SORBITOL 70 % SOLN
30.0000 mL | Freq: Every day | Status: DC | PRN
  Filled 2013-08-25: qty 30

## 2013-08-25 MED ORDER — LEVOTHYROXINE SODIUM 100 MCG PO TABS
100.0000 ug | ORAL_TABLET | Freq: Every day | ORAL | Status: DC
  Administered 2013-08-26: 100 ug via ORAL
  Filled 2013-08-25 (×2): qty 1

## 2013-08-25 MED ORDER — PIPERACILLIN-TAZOBACTAM IN DEX 2-0.25 GM/50ML IV SOLN
2.2500 g | Freq: Four times a day (QID) | INTRAVENOUS | Status: DC
  Administered 2013-08-25 – 2013-08-27 (×6): 2.25 g via INTRAVENOUS
  Filled 2013-08-25 (×8): qty 50

## 2013-08-25 MED ORDER — ALUM & MAG HYDROXIDE-SIMETH 200-200-20 MG/5ML PO SUSP: 30.0000 mL | Freq: Four times a day (QID) | ORAL | Status: DC | PRN

## 2013-08-25 MED ORDER — SODIUM CHLORIDE 0.9 % IJ SOLN
3.0000 mL | Freq: Two times a day (BID) | INTRAMUSCULAR | Status: DC
  Administered 2013-08-25 – 2013-08-27 (×4): 3 mL via INTRAVENOUS

## 2013-08-25 MED ORDER — ATORVASTATIN CALCIUM 20 MG PO TABS
20.0000 mg | ORAL_TABLET | Freq: Every day | ORAL | Status: DC
  Filled 2013-08-25 (×2): qty 1

## 2013-08-25 MED ORDER — HEPARIN SODIUM (PORCINE) 5000 UNIT/ML IJ SOLN
5000.0000 [IU] | Freq: Three times a day (TID) | INTRAMUSCULAR | Status: DC
  Administered 2013-08-25 – 2013-08-26 (×2): 5000 [IU] via SUBCUTANEOUS
  Filled 2013-08-25 (×2): qty 1

## 2013-08-25 MED ORDER — ALBUTEROL SULFATE (2.5 MG/3ML) 0.083% IN NEBU
2.5000 mg | INHALATION_SOLUTION | RESPIRATORY_TRACT | Status: DC | PRN
  Administered 2013-08-25: 2.5 mg via RESPIRATORY_TRACT
  Filled 2013-08-25: qty 3

## 2013-08-25 MED ORDER — PHENYLEPHRINE HCL 10 MG/ML IJ SOLN
30.0000 ug/min | INTRAVENOUS | Status: DC
  Administered 2013-08-25: 200 ug/min via INTRAVENOUS
  Administered 2013-08-26: 175 ug/min via INTRAVENOUS
  Administered 2013-08-26: 200 ug/min via INTRAVENOUS
  Administered 2013-08-26: 150 ug/min via INTRAVENOUS
  Administered 2013-08-26: 200 ug/min via INTRAVENOUS
  Administered 2013-08-26: 150 ug/min via INTRAVENOUS
  Administered 2013-08-27: 250 ug/min via INTRAVENOUS
  Administered 2013-08-27: 150 ug/min via INTRAVENOUS
  Administered 2013-08-27: 200 ug/min via INTRAVENOUS
  Administered 2013-08-28: 300 ug/min via INTRAVENOUS
  Administered 2013-08-28: 275 ug/min via INTRAVENOUS
  Filled 2013-08-25 (×18): qty 4

## 2013-08-25 MED ORDER — MIDAZOLAM HCL 2 MG/2ML IJ SOLN
INTRAMUSCULAR | Status: AC
  Administered 2013-08-25: 2 mg
  Filled 2013-08-25: qty 2

## 2013-08-25 MED ORDER — PIPERACILLIN-TAZOBACTAM IN DEX 2-0.25 GM/50ML IV SOLN: 2.2500 g | Freq: Three times a day (TID) | INTRAVENOUS | Status: DC

## 2013-08-25 MED ORDER — HYDROCORTISONE NA SUCCINATE PF 100 MG IJ SOLR: 100.0000 mg | Freq: Once | INTRAMUSCULAR | Status: DC

## 2013-08-25 MED ORDER — TRAMADOL HCL 50 MG PO TABS: 50.0000 mg | ORAL_TABLET | Freq: Four times a day (QID) | ORAL | Status: DC | PRN

## 2013-08-25 MED ORDER — FENTANYL BOLUS VIA INFUSION
25.0000 ug | INTRAVENOUS | Status: DC | PRN
  Filled 2013-08-25: qty 50

## 2013-08-25 MED ORDER — FENTANYL CITRATE 0.05 MG/ML IJ SOLN
INTRAMUSCULAR | Status: DC
Start: 2013-08-25 — End: 2013-08-26
  Filled 2013-08-25: qty 2

## 2013-08-25 MED ORDER — HYDROCORTISONE NA SUCCINATE PF 100 MG IJ SOLR
100.0000 mg | Freq: Four times a day (QID) | INTRAMUSCULAR | Status: DC
  Administered 2013-08-26 (×2): 100 mg via INTRAVENOUS
  Filled 2013-08-25 (×2): qty 2

## 2013-08-25 NOTE — Progress Notes (Signed)
ANTIBIOTIC CONSULT NOTE - INITIAL  Pharmacy Consult for Vancomycin/Zosyn Indication: Sepsis   Allergies  Allergen Reactions  . Black Pepper [[place]] Anaphylaxis  . Tomato Anaphylaxis    Has epi pen    Patient Measurements: Weight: 100 lb (45.36 kg)   Vital Signs: Temp: 95.5 F (35.3 C) (06/22 1513) Temp src: Core (Comment) (06/22 1513) BP: 68/33 mmHg (06/22 1630) Pulse Rate: 68 (06/22 1600) Intake/Output from previous day:   Intake/Output from this shift: Total I/O In: -  Out: 10 [Urine:10]  Labs:  Recent Labs  08/22/2013 1129 08/23/2013 1350 08/27/2013 1404  WBC 15.0* 13.3*  --   HGB 11.5* 10.6* 12.2  PLT 465* 513*  --   CREATININE 4.2* 4.02* 4.50*   The CrCl is unknown because both a height and weight (above a minimum accepted value) are required for this calculation. No results found for this basename: VANCOTROUGH, Corlis Leak, VANCORANDOM, [place], GENTPEAK, GENTRANDOM, [place], TOBRAPEAK, TOBRARND, AMIKACINPEAK, AMIKACINTROU, AMIKACIN,  in the last 72 hours   Microbiology: Recent Results (from the past 720 hour(s))  CULTURE, BLOOD (ROUTINE X 2)     Status: None   Collection Time    07/30/13  7:24 PM      Result Value Ref Range Status   Specimen Description BLOOD RIGHT FOREARM   Final   Special Requests BOTTLES DRAWN AEROBIC ONLY 5CC   Final   Culture  Setup Time     Final   Value: 07/31/2013 00:43     Performed at [facility]   Culture     Final   Value: NO GROWTH 5 DAYS     Performed at [facility]   Report Status 08/06/2013 FINAL   Final  CULTURE, BLOOD (ROUTINE X 2)     Status: None   Collection Time    07/30/13  7:34 PM      Result Value Ref Range Status   Specimen Description BLOOD RIGHT FOREARM   Final   Special Requests BOTTLES DRAWN AEROBIC AND ANAEROBIC 5CC   Final   Culture  Setup Time     Final   Value: 07/31/2013 00:43     Performed at [facility]   Culture     Final   Value: NO GROWTH 5 DAYS     Performed  at [facility]   Report Status 08/06/2013 FINAL   Final  MRSA PCR SCREENING     Status: None   Collection Time    07/30/13  9:59 PM      Result Value Ref Range Status   MRSA by PCR NEGATIVE  NEGATIVE Final   Comment:            The GeneXpert MRSA Assay (FDA     approved for NASAL specimens     only), is one component of a     comprehensive MRSA colonization     surveillance program. It is not     intended to diagnose MRSA     infection nor to guide or     monitor treatment for     MRSA infections.  CULTURE, BLOOD (ROUTINE X 2)     Status: None   Collection Time    07/30/13 10:00 PM      Result Value Ref Range Status   Specimen Description BLOOD RIGHT HAND   Final   Special Requests BOTTLES DRAWN AEROBIC ONLY 5CC   Final   Culture  Setup Time     Final   Value:  07/31/2013 03:54     Performed at [facility]   Culture     Final   Value: NO GROWTH 5 DAYS     Performed at [facility]   Report Status 08/06/2013 FINAL   Final  CULTURE, BLOOD (ROUTINE X 2)     Status: None   Collection Time    07/30/13 10:09 PM      Result Value Ref Range Status   Specimen Description BLOOD LEFT HAND   Final   Special Requests BOTTLES DRAWN AEROBIC ONLY 1.5CC   Final   Culture  Setup Time     Final   Value: 07/31/2013 03:54     Performed at [facility]   Culture     Final   Value: NO GROWTH 5 DAYS     Performed at [facility]   Report Status 08/06/2013 FINAL   Final  CULTURE, EXPECTORATED SPUTUM-ASSESSMENT     Status: None   Collection Time    07/31/13  9:06 AM      Result Value Ref Range Status   Specimen Description SPUTUM   Final   Special Requests NONE   Final   Sputum evaluation     Final   Value: MICROSCOPIC FINDINGS SUGGEST THAT THIS SPECIMEN IS NOT REPRESENTATIVE OF LOWER RESPIRATORY SECRETIONS. PLEASE RECOLLECT.     Gram Stain Report Called to,Read Back By and Verified With: Darryll Capers RN 11:45 07/31/13 ([email or web address])   Report Status  07/31/2013 FINAL   Final  CULTURE, BLOOD (ROUTINE X 2)     Status: None   Collection Time    08/16/13  7:05 AM      Result Value Ref Range Status   Specimen Description BLOOD RIGHT ARM   Final   Special Requests BOTTLES DRAWN AEROBIC ONLY 5CC   Final   Culture  Setup Time     Final   Value: 08/16/2013 15:48     Performed at [facility]   Culture     Final   Value: NO GROWTH 5 DAYS     Performed at [facility]   Report Status 08/22/2013 FINAL   Final  CULTURE, BLOOD (ROUTINE X 2)     Status: None   Collection Time    08/16/13  7:20 AM      Result Value Ref Range Status   Specimen Description BLOOD RIGHT ARM   Final   Special Requests BOTTLES DRAWN AEROBIC ONLY 2CC   Final   Culture  Setup Time     Final   Value: 08/16/2013 15:48     Performed at [facility]   Culture     Final   Value: NO GROWTH 5 DAYS     Performed at [facility]   Report Status 08/22/2013 FINAL   Final    Medical History: Past Medical History  Diagnosis Date  . Compensated hypothyroidism   . Essential hypertension   . Dyslipidemia   . Colon polyps   . Anxiety   . COPD (chronic obstructive pulmonary disease)     secondary to smoking.  . Avascular necrosis     right femoral head  . Hyperlipidemia   . Anemia      Postoperative hemorrhagic anemia  . Allergic reaction 10/24/2012  . Arthritis     "hands" (07/31/2013)  . Lung mass     /CT 07/11/2013  . Mass of stomach     /CT 07/11/2013: EGD 08/01/13 negative for mass  .  CAP (community acquired pneumonia) 07/30/2013    "first time I've ever had pneumonia"  . Atrial flutter with rapid ventricular response     Archie Endo 07/31/2013  . Subdural hemorrhage following injury 2013    fall/notes 07/30/2013    Medications:  Scheduled:  . [START ON 08/26/2013] aspirin EC  81 mg Oral Daily  . atorvastatin  20 mg Oral QHS  . ENSURE  237 mL Oral TID BM  . heparin  5,000 Units Subcutaneous 3 times per day  . hydrocortisone sodium  succinate  100 mg Intravenous Once  . hydrocortisone sod succinate (SOLU-CORTEF) inj  100 mg Intravenous Q6H  . Ipratropium-Albuterol  1 puff Inhalation Q6H  . [START ON 08/26/2013] levothyroxine  100 mcg Oral QAC breakfast  . nicotine  14 mg Transdermal Daily  . [START ON 08/26/2013] pantoprazole  40 mg Oral Q0600  . sodium chloride  3 mL Intravenous Q12H   Infusions:  . sodium chloride    . 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  Found to have collapsed Left Lung on CT chest.  Starting broad spectrum abx for Sepsis/PNA  Noted low body weight of 45.4 kg  6/22 >> Vancomycin >>  6/22 >> Zosyn >>   WBC: Elevated 13.3 TEmp: Low 95.5 Renal: ARF with Scr elevated to 4.5, estimated CrCl 12 ml/min (Note Scr was WNL on 6/16 (0.86)   Micro 6/22 Urine: Sent 6/22 Blood: Sent   Goal of Therapy:  Vancomycin trough level 15-20 mcg/ml Zosyn per renal function   Plan: 1.) Patient received 1 gram vancomycin x 1 in ER.  She will need lower dose of 500 mg IV q48h hours based on current weight and renal function  2.) Zosyn 2.25 grams IV q6h 3.) Monitor renal function, daily BMETs.   4.) Check VT at Css or as needed  5.) f/u cultures   [place], Gaye Alken PharmD Pager #: [phone] 5:07 PM 08/20/2013

## 2013-08-25 NOTE — Significant Event (Signed)
Rapid Response Event Note  Overview: Time Called: 6433 Arrival Time: 1215 Event Type: Hypotension  Initial Focused Assessment: Called to [place] exam room 19 to evaluate patient who had a vagal response after ambulating to restroom for a BM.  Per staff, patient was able to ambulate and was verbal and oriented prior to event.  Upon arrival, patient appeared lethargic, drooling, with garbled speech.     Interventions:  Receiving NS bolus via IV started by cancer center RN.  On monitor, patient BP 69/55, 80/47, 79/58, HR 80s afib, unable to pick up O2 sats by probe.  Patient on 4L nasal cannula (baseline).  Patient unable to answer questions, yet positive strong grips bilaterally.  Patient's daughter and Dr. Earlie Server at bedside, reports this is a change from baseline.   Event Summary: Patient with vagal response and persistent hypotension, garbled speech, lethargy, (new onset?) afib assisted to stretcher and transported to ED for further evaluation.     at      at          [facility], Kennis Carina

## 2013-08-25 NOTE — Procedures (Signed)
Intubation Procedure Note Emma Douglas [id number] 1940-03-21  Procedure: Intubation Indications: Respiratory insufficiency  Procedure Details Consent: Risks of procedure as well as the alternatives and risks of each were explained to the (patient/caregiver).  Consent for procedure obtained. Time Out: Verified patient identification, verified procedure, site/side was marked, verified correct patient position, special equipment/implants available, medications/allergies/relevent history reviewed, required imaging and test results available.  Performed  Maximum sterile technique was used including gloves, hand hygiene and mask.  MAC and 3    Evaluation Hemodynamic Status: Transient hypotension treated with pressors and fluid; O2 sats: stable throughout Patient's Current Condition: stable Complications: No apparent complications Patient did tolerate procedure well. Chest X-ray ordered to verify placement.  CXR: pending.   [name], ACNP [facility] Pulmonology/Critical Care Pager [phone] or [phone]   Supervised by me  Rigoberto Noel MD

## 2013-08-25 NOTE — ED Provider Notes (Signed)
CSN: [id number]     Arrival date & time 08/16/2013  1237 History   First MD Initiated Contact with Patient 09/02/2013 1245     Chief Complaint  Patient presents with  . Near Syncope     (Consider location/radiation/quality/duration/timing/severity/associated sxs/prior Treatment) HPI Comments: Patient brought in for syncopal episode. She was actually at the cancer center seen Dr. Inda Merlin. She's been recently diagnosed with lung cancer and was scheduled to start treatment tomorrow per her daughter. While she was there, she was getting some bloodshot and she said she started to feel nauseated and hot, she went to the bathroom where she had a syncopal episode. She had a large episode of diarrhea but no noticeable vomiting. She was noted to be hypotensive and was brought here to the ED by the rapid response team. She was still noted to be hypotensive in the ED with a pressure of 70/45. She's had some garbled speech since this episode. She doesn't have any unilateral deficits. She says she feels weak all over but denies any complaints of pain. Her daughter said she's been complaining of some bilateral leg pain for the last several days they haven't noticed any swelling. She was recently started on oxygen at home last week. She denies any chest pain or increased shortness of breath.  Patient is a 73 y.o. female presenting with near-syncope.  Near Syncope Pertinent negatives include no chest pain, no abdominal pain, no headaches and no shortness of breath.    Past Medical History  Diagnosis Date  . Compensated hypothyroidism   . Essential hypertension   . Dyslipidemia   . Colon polyps   . Anxiety   . COPD (chronic obstructive pulmonary disease)     secondary to smoking.  . Avascular necrosis     right femoral head  . Hyperlipidemia   . Anemia      Postoperative hemorrhagic anemia  . Allergic reaction 10/24/2012  . Arthritis     "hands" (07/31/2013)  . Lung mass     /CT 07/11/2013  . Mass of  stomach     /CT 07/11/2013: EGD 08/01/13 negative for mass  . CAP (community acquired pneumonia) 07/30/2013    "first time I've ever had pneumonia"  . Atrial flutter with rapid ventricular response     Archie Endo 07/31/2013  . Subdural hemorrhage following injury 2013    fall/notes 07/30/2013   Past Surgical History  Procedure Laterality Date  . Colonoscopy      with polypectomy and biopsy  . Total hip arthroplasty Right 2002  . Dilation and curettage of uterus    . Tubal ligation    . Esophagogastroduodenoscopy (egd) with propofol N/A 08/01/2013    Procedure: ESOPHAGOGASTRODUODENOSCOPY (EGD) WITH PROPOFOL;  Surgeon: Jeryl Columbia, MD;  Location: [facility] ENDOSCOPY;  Service: Endoscopy;  Laterality: N/A;  . Video bronchoscopy Bilateral 08/04/2013    Procedure: VIDEO BRONCHOSCOPY WITH FLUORO;  Surgeon: Elsie Stain, MD;  Location: [facility];  Service: Cardiopulmonary;  Laterality: Bilateral;  . Video bronchoscopy N/A 08/04/2013    Procedure: VIDEO BRONCHOSCOPY WITH FLUORO;  Surgeon: Elsie Stain, MD;  Location: [facility];  Service: Cardiopulmonary;  Laterality: N/A;   Family History  Problem Relation Age of Onset  . Heart attack Mother   . Heart attack Father    History  Substance Use Topics  . Smoking status: Current Some Day Smoker -- 0.50 packs/day for 19 years    Types: Cigarettes  . Smokeless tobacco: Never Used  . Alcohol Use:  Yes     Comment: 07/31/2013 "40oz beer q 2 days; none in the last 4 weeks"   OB History   Grav Para Term Preterm Abortions TAB SAB Ect Mult Living                 Review of Systems  Constitutional: Positive for fatigue. Negative for fever, chills and diaphoresis.  HENT: Negative for congestion, rhinorrhea and sneezing.   Eyes: Negative.   Respiratory: Negative for cough, chest tightness and shortness of breath.   Cardiovascular: Positive for near-syncope. Negative for chest pain and leg swelling.  Gastrointestinal: Negative for nausea, vomiting,  abdominal pain, diarrhea and blood in stool.  Genitourinary: Negative for frequency, hematuria, flank pain and difficulty urinating.  Musculoskeletal: Negative for arthralgias and back pain.  Skin: Negative for rash.  Neurological: Positive for speech difficulty and weakness (generalized). Negative for dizziness, numbness and headaches.      Allergies  Black pepper and Tomato  Home Medications   Prior to Admission medications   Medication Sig Start Date End Date Taking? Authorizing Laresa Oshiro  atorvastatin (LIPITOR) 20 MG tablet Take 1 tablet (20 mg total) by mouth at bedtime. 08/06/13  Yes Barton Dubois, MD  diltiazem (CARDIZEM) 60 MG tablet Take 1 tablet (60 mg total) by mouth every 8 (eight) hours. 08/06/13  Yes Barton Dubois, MD  Docusate Sodium (DSS) 100 MG CAPS Take 200 mg by mouth at bedtime as needed (constipation). 08/06/13  Yes Barton Dubois, MD  ENSURE (ENSURE) Take 237 mLs by mouth 3 (three) times daily between meals. VANILLA.   Yes Historical Coriann Brouhard, MD  EPINEPHrine (EPI-PEN) 0.3 mg/0.3 mL SOAJ injection Inject 0.3 mLs (0.3 mg total) into the muscle once. 10/24/12  Yes Mary B Dixon, PA-C  furosemide (LASIX) 40 MG tablet Take 1 tablet (40 mg total) by mouth daily. 08/19/13  Yes Velvet Bathe, MD  Ipratropium-Albuterol (COMBIVENT) 20-100 MCG/ACT AERS respimat Inhale 1 puff into the lungs every 6 (six) hours. 08/06/13  Yes Barton Dubois, MD  levothyroxine (SYNTHROID, LEVOTHROID) 100 MCG tablet Take 100 mcg by mouth daily before breakfast.   Yes Historical Shlomo Seres, MD  lisinopril (PRINIVIL,ZESTRIL) 10 MG tablet Take 1 tablet (10 mg total) by mouth daily. 07/09/13  Yes Orlena Sheldon, PA-C  Multiple Vitamin (MULTIVITAMIN) tablet Take 1 tablet by mouth daily.   Yes Historical Aireona Torelli, MD  nicotine (NICODERM CQ - DOSED IN MG/24 HOURS) 14 mg/24hr patch Place 1 patch (14 mg total) onto the skin daily. 08/19/13  Yes Velvet Bathe, MD  polyethylene glycol (MIRALAX / GLYCOLAX) packet Take 17 g by mouth  daily as needed for moderate constipation. 08/06/13  Yes Barton Dubois, MD  traMADol (ULTRAM) 50 MG tablet Take 50 mg by mouth every 6 (six) hours as needed for moderate pain.   Yes Historical Cha Gomillion, MD  aspirin 81 MG EC tablet Take 81 mg by mouth daily. 08/19/13   Velvet Bathe, MD  prochlorperazine (COMPAZINE) 10 MG tablet Take 1 tablet (10 mg total) by mouth every 6 (six) hours as needed for nausea or vomiting. 08/15/13   Curt Bears, MD   BP 86/38  Pulse 84  Temp(Src) 95.5 F (35.3 C) (Core (Comment))  Resp 14  Wt 100 lb (45.36 kg)  SpO2 97% Physical Exam  Constitutional: She is oriented to person, place, and time. She appears well-developed and well-nourished. She appears distressed.  HENT:  Head: Normocephalic and atraumatic.  Eyes: Pupils are equal, round, and reactive to light.  Neck: Normal range of  motion. Neck supple.  Cardiovascular: Normal rate, regular rhythm and normal heart sounds.   Pulmonary/Chest: Effort normal and breath sounds normal. No respiratory distress. She has no wheezes. She has no rales. She exhibits no tenderness.  Abdominal: Soft. Bowel sounds are normal. There is no tenderness. There is no rebound and no guarding.  Musculoskeletal: Normal range of motion. She exhibits no edema.  Lymphadenopathy:    She has no cervical adenopathy.  Neurological: She is alert and oriented to person, place, and time.  Patient is awake and alert. She does seem somewhat drowsy and is slow to answer questions. Her speech is very hard to understand. She has no facial drooping. She has no unilateral deficits. She has generalized weakness not she needs but she has symmetric bilaterally. She's unable to raise either leg against gravity. She has normal sensation in all extremities.  Skin: Skin is warm and dry. No rash noted.  Psychiatric: She has a normal mood and affect.    ED Course  Procedures (including critical care time) Labs Review Results for orders placed during the  hospital encounter of 08/31/2013  CBC WITH DIFFERENTIAL      Result Value Ref Range   WBC 13.3 (*) 4.0 - 10.5 K/uL   RBC 3.39 (*) 3.87 - 5.11 MIL/uL   Hemoglobin 10.6 (*) 12.0 - 15.0 g/dL   HCT 32.8 (*) 36.0 - 46.0 %   MCV 96.8  78.0 - 100.0 fL   MCH 31.3  26.0 - 34.0 pg   MCHC 32.3  30.0 - 36.0 g/dL   RDW 14.3  11.5 - 15.5 %   Platelets 513 (*) 150 - 400 K/uL   Neutrophils Relative % 88 (*) 43 - 77 %   Neutro Abs 11.8 (*) 1.7 - 7.7 K/uL   Lymphocytes Relative 4 (*) 12 - 46 %   Lymphs Abs 0.5 (*) 0.7 - 4.0 K/uL   Monocytes Relative 8  3 - 12 %   Monocytes Absolute 1.0  0.1 - 1.0 K/uL   Eosinophils Relative 0  0 - 5 %   Eosinophils Absolute 0.0  0.0 - 0.7 K/uL   Basophils Relative 0  0 - 1 %   Basophils Absolute 0.0  0.0 - 0.1 K/uL  COMPREHENSIVE METABOLIC PANEL      Result Value Ref Range   Sodium 140  137 - 147 mEq/L   Potassium 4.2  3.7 - 5.3 mEq/L   Chloride 93 (*) 96 - 112 mEq/L   CO2 23  19 - 32 mEq/L   Glucose, Bld 119 (*) 70 - 99 mg/dL   BUN 89 (*) 6 - 23 mg/dL   Creatinine, Ser 4.02 (*) 0.50 - 1.10 mg/dL   Calcium 9.6  8.4 - 10.5 mg/dL   Total Protein 6.8  6.0 - 8.3 g/dL   Albumin 2.1 (*) 3.5 - 5.2 g/dL   AST 35  0 - 37 U/L   ALT 16  0 - 35 U/L   Alkaline Phosphatase 106  39 - 117 U/L   Total Bilirubin <0.2 (*) 0.3 - 1.2 mg/dL   GFR calc non Af Amer 10 (*) >90 mL/min   GFR calc Af Amer 12 (*) >90 mL/min  URINALYSIS, ROUTINE W REFLEX MICROSCOPIC      Result Value Ref Range   Color, Urine YELLOW  YELLOW   APPearance CLOUDY (*) CLEAR   Specific Gravity, Urine 1.015  1.005 - 1.030   pH 5.0  5.0 - 8.0   Glucose,  UA NEGATIVE  NEGATIVE mg/dL   Hgb urine dipstick MODERATE (*) NEGATIVE   Bilirubin Urine NEGATIVE  NEGATIVE   Ketones, ur NEGATIVE  NEGATIVE mg/dL   Protein, ur NEGATIVE  NEGATIVE mg/dL   Urobilinogen, UA 0.2  0.0 - 1.0 mg/dL   Nitrite NEGATIVE  NEGATIVE   Leukocytes, UA NEGATIVE  NEGATIVE  D-DIMER, QUANTITATIVE      Result Value Ref Range   D-Dimer,  Quant 11.29 (*) 0.00 - 0.48 ug/mL-FEU  URINE MICROSCOPIC-ADD ON      Result Value Ref Range   Squamous Epithelial / LPF RARE  RARE   WBC, UA 0-2  <3 WBC/hpf   RBC / HPF 3-6  <3 RBC/hpf   Bacteria, UA FEW (*) RARE   Urine-Other RARE YEAST    I-STAT CHEM 8, ED      Result Value Ref Range   Sodium 137  137 - 147 mEq/L   Potassium 4.0  3.7 - 5.3 mEq/L   Chloride 97  96 - 112 mEq/L   BUN 97 (*) 6 - 23 mg/dL   Creatinine, Ser 4.50 (*) 0.50 - 1.10 mg/dL   Glucose, Bld 119 (*) 70 - 99 mg/dL   Calcium, Ion 1.11 (*) 1.13 - 1.30 mmol/L   TCO2 23  0 - 100 mmol/L   Hemoglobin 12.2  12.0 - 15.0 g/dL   HCT 36.0  36.0 - 46.0 %  I-STAT TROPOININ, ED      Result Value Ref Range   Troponin i, poc 0.04  0.00 - 0.08 ng/mL   Comment 3           I-STAT CG4 LACTIC ACID, ED      Result Value Ref Range   Lactic Acid, Venous 4.85 (*) 0.5 - 2.2 mmol/L     Imaging Review Ct Head Wo Contrast  08/09/2013   CLINICAL DATA:  Syncope and fall.  EXAM: CT HEAD WITHOUT CONTRAST  TECHNIQUE: Contiguous axial images were obtained from the base of the skull through the vertex without intravenous contrast.  COMPARISON:  Brain MRI 08/08/2013  FINDINGS: There is no evidence of acute cortical infarct, intracranial hemorrhage, mass, or midline shift. 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 Patchy hypodensities in the periventricular white matter are compatible with mild chronic small vessel ischemic disease. There is mild cerebral atrophy.  Orbits are unremarkable. Mastoid air cells are clear. Left maxillary sinus mucous retention cyst is partially visualized.  IMPRESSION: 1. No evidence of acute intracranial abnormality. 2. Unchanged, chronic small bifrontal subdural hematomas.   Electronically Signed   By: Logan Bores   On: 08/30/2013 14:42   Dg Chest Portable 1 View  08/30/2013   CLINICAL DATA:  NEAR SYNCOPE  EXAM: PORTABLE CHEST - 1 VIEW  COMPARISON:  Prior radiograph from 08/16/2013  FINDINGS: Cardiac  and mediastinal silhouettes are largely obscured. Bulky right hilar adenopathy is grossly similar.  There is essentially complete opacification of the left hemi thorax, likely related to postobstructive consolidation from known left lower lobe lung mass. A component of pleural effusion may also be present.  Aerated right lung is grossly clear. Known right upper lobe mass is grossly stable. No pneumothorax.  No acute osseous abnormality.  Prominent degenerative changes noted about the shoulders bilaterally.  IMPRESSION: 1. Complete opacification of the left hemi thorax, likely related to in large part postobstructive consolidation from known left lower lobe lung mass. A component of pleural effusion may also be present. 2. Grossly stable bulky hilar adenopathy.  Electronically Signed   By: Jeannine Boga M.D.   On: 08/07/2013 14:28     EKG Interpretation None      MDM   Final diagnoses:  Syncope and collapse  Collapse of left lung  Hypothermia, initial encounter  Acute renal failure, unspecified acute renal failure type  Other specified hypotension    Patient presents with hypotension after syncopal episode. She received 2 L IV fluids and her pressure did recently come back up into the 90s. However it was labile and subsequently went back down into the EEG in the 70s. Her chest x-ray shows evidence of a complete opacification of the left hemithorax, likely postobstructive. She was started on broad-spectrum antibiotics for possible pneumonia and/or sepsis. She also had a cortisol level drawn and was given dose of hydrocortisone given the ongoing hypotension. Her blood pressure dropped back down into the 70s and she was started on the levophed. I spoke with critical care initially when her blood pressure was in the 90s and they felt it was more appropriate for the hospitalist to admit the patient. Dr. Grandville Silos the triad hospitalist came down and evaluated the patient. At this point, her blood  pressure dropped back down into the 80s and in the 70s. The decision was made to start her on a pressor and Dr. Nelda Marseille with critical care is here to evaluate the patient for the lung collapse. He placed a central line. At this point the hospitalist will still admit the patient with critical care consultation. I also discussed with Dr. Grandville Silos her elevated d-dimer. At this point it's not an option to do a CTA given her acute renal failure. I spoke with the radiologist, Dr. Jeannine Boga who did not feel that a VQ scan would be helpful. Dr. Grandville Silos and his pain did do bilateral lower extremity Dopplers and at that point make a decision whether to anticoagulate the patient. She is noted to be in atrial flutter on the monitor but her rate is controlled. She is in acute renal failure with a creatinine of 4.5 however she does have a normal potassium.  CRITICAL CARE Performed by: BELFI, MELANIE Total critical care time: 90 Critical care time was exclusive of separately billable procedures and treating other patients. Critical care was necessary to treat or prevent imminent or life-threatening deterioration. Critical care was time spent personally by me on the following activities: development of treatment plan with patient and/or surrogate as well as nursing, discussions with consultants, evaluation of patient's response to treatment, examination of patient, obtaining history from patient or surrogate, ordering and performing treatments and interventions, ordering and review of laboratory studies, ordering and review of radiographic studies, pulse oximetry and re-evaluation of patient's condition.     Malvin Johns, MD 08/07/2013 [phone]

## 2013-08-25 NOTE — ED Notes (Signed)
Notified lab that nurse and phlebotomist unable to obtain blood samples.

## 2013-08-25 NOTE — Progress Notes (Signed)
On my arrival, In moderate resp distress, on NRB Did not tolerate bipap In SVT on levophed @ 20 mcg Patient & daughter were ok with intubation Intubated with 2 mg versed , 100 mcg fent & 20 mg etomidate Vent & sedation orders given Neo gtt added, 500 NS bolus -CXR reviewed for ETT position. Discussed further with daughter, no CPR, no cardioversion in event of arrest Continue palliative discussion further in am   Additional CC time (independent of procedure) x 35 mins  Kara Mead MD. FCCP. [facility] Pager [phone] If no response call [phone]

## 2013-08-25 NOTE — ED Notes (Signed)
Unsuccessfully attempted to obtain blood for labs.RN made aware 

## 2013-08-25 NOTE — Progress Notes (Signed)
Patient placed on 50% Venti mask per MD.

## 2013-08-25 NOTE — Progress Notes (Signed)
[facility] [phone]   [phone]  OFFICE PROGRESS NOTE  Karis Juba, PA-C [address] [place] [address]  DIAGNOSIS: Extensive stage small cell lung cancer diagnosed in June of 2015.  PRIOR THERAPY: None  CURRENT THERAPY: She is currently evaluated for the enrollment in the clinical trial according to the ECoG E 2511 with the patient would be treated with cisplatin and etoposide plus/minus Veliparib.   INTERVAL HISTORY: Emma Douglas 73 y.o. female returns to the clinic today for followup visit accompanied by her daughter. She was brought to the exam room today after she has syncopal disorder and was unresponsive after a big bowel movement in the bathroom. According to her daughter the patient was doing okay with her normal shortness of breath until she had this episode. She was started on IV hydration and a good response team was called. She was hypotensive in the clinic with systolic blood pressure in the 60's.   MEDICAL HISTORY: Past Medical History  Diagnosis Date  . Compensated hypothyroidism   . Essential hypertension   . Dyslipidemia   . Colon polyps   . Anxiety   . COPD (chronic obstructive pulmonary disease)     secondary to smoking.  . Avascular necrosis     right femoral head  . Hyperlipidemia   . Anemia      Postoperative hemorrhagic anemia  . Allergic reaction 10/24/2012  . Arthritis     "hands" (07/31/2013)  . Lung mass     /CT 07/11/2013  . Mass of stomach     /CT 07/11/2013  . CAP (community acquired pneumonia) 07/30/2013    "first time I've ever had pneumonia"  . Atrial flutter with rapid ventricular response     Archie Endo 07/31/2013  . Subdural hemorrhage following injury 2013    fall/notes 07/30/2013    ALLERGIES:  is allergic to black pepper and tomato.  MEDICATIONS:  Current Outpatient Prescriptions  Medication Sig Dispense Refill  . aspirin EC 81 MG EC tablet Take 1 tablet (81 mg total) by  mouth daily.  30 tablet  0  . atorvastatin (LIPITOR) 20 MG tablet Take 1 tablet (20 mg total) by mouth at bedtime.  30 tablet  1  . diltiazem (CARDIZEM) 60 MG tablet Take 1 tablet (60 mg total) by mouth every 8 (eight) hours.  90 tablet  1  . docusate sodium 100 MG CAPS Take 200 mg by mouth at bedtime.  10 capsule  0  . ENSURE (ENSURE) Take 237 mLs by mouth 3 (three) times daily between meals.      Marland Kitchen EPINEPHrine (EPI-PEN) 0.3 mg/0.3 mL SOAJ injection Inject 0.3 mLs (0.3 mg total) into the muscle once.  1 Device  2  . furosemide (LASIX) 40 MG tablet Take 1 tablet (40 mg total) by mouth daily.  30 tablet  0  . Ipratropium-Albuterol (COMBIVENT) 20-100 MCG/ACT AERS respimat Inhale 1 puff into the lungs every 6 (six) hours.  4 g  2  . levothyroxine (SYNTHROID, LEVOTHROID) 100 MCG tablet Take 100 mcg by mouth daily before breakfast.      . lisinopril (PRINIVIL,ZESTRIL) 10 MG tablet Take 1 tablet (10 mg total) by mouth daily.  90 tablet  3  . Multiple Vitamin (MULTIVITAMIN) tablet Take 1 tablet by mouth daily.      . nicotine (NICODERM CQ - DOSED IN MG/24 HOURS) 14 mg/24hr patch Place 1 patch (14 mg total) onto the skin daily.  28 patch  0  . polyethylene glycol (MIRALAX / GLYCOLAX) packet Take 17 g by mouth daily as needed for moderate constipation.  14 each  0  . prochlorperazine (COMPAZINE) 10 MG tablet Take 1 tablet (10 mg total) by mouth every 6 (six) hours as needed for nausea or vomiting.  60 tablet  0  . traMADol (ULTRAM) 50 MG tablet Take 50 mg by mouth every 6 (six) hours as needed for moderate pain.       No current facility-administered medications for this visit.    SURGICAL HISTORY:  Past Surgical History  Procedure Laterality Date  . Colonoscopy      with polypectomy and biopsy  . Total hip arthroplasty Right 2002  . Dilation and curettage of uterus    . Tubal ligation    . Esophagogastroduodenoscopy (egd) with propofol N/A 08/01/2013    Procedure: ESOPHAGOGASTRODUODENOSCOPY (EGD)  WITH PROPOFOL;  Surgeon: Jeryl Columbia, MD;  Location: [facility] ENDOSCOPY;  Service: Endoscopy;  Laterality: N/A;  . Video bronchoscopy Bilateral 08/04/2013    Procedure: VIDEO BRONCHOSCOPY WITH FLUORO;  Surgeon: Elsie Stain, MD;  Location: [place];  Service: Cardiopulmonary;  Laterality: Bilateral;  . Video bronchoscopy N/A 08/04/2013    Procedure: VIDEO BRONCHOSCOPY WITH FLUORO;  Surgeon: Elsie Stain, MD;  Location: [facility];  Service: Cardiopulmonary;  Laterality: N/A;    REVIEW OF SYSTEMS:  She is poorly responsive and unable to give history.  PHYSICAL EXAMINATION: General appearance: severe distress Resp: Decreased breath sound on the left lung field Cardio: irregularly irregular rhythm Extremities: extremities normal, atraumatic, no cyanosis or edema Neurologic: Mental status:  Poorly responsive  ECOG PERFORMANCE STATUS: 2 - Symptomatic, <50% confined to bed  There were no vitals taken for this visit.  LABORATORY DATA: Lab Results  Component Value Date   WBC 15.0* 08/27/2013   HGB 11.5* 08/30/2013   HCT 36.0 08/17/2013   MCV 98.1 08/23/2013   PLT 465* 08/26/2013      Chemistry      Component Value Date/Time   NA 136* 08/19/2013 0354   NA 138 08/15/2013 0945   K 5.0 08/19/2013 0354   K 4.4 08/15/2013 0945   CL 93* 08/19/2013 0354   CO2 27 08/19/2013 0354   CO2 28 08/15/2013 0945   BUN 16 08/19/2013 0354   BUN 11.7 08/15/2013 0945   CREATININE 0.86 08/19/2013 0354   CREATININE 0.8 08/15/2013 0945   CREATININE 0.83 07/02/2013 1246      Component Value Date/Time   CALCIUM 9.9 08/19/2013 0354   CALCIUM 10.7* 08/15/2013 0945   ALKPHOS 129* 08/16/2013 0720   ALKPHOS 132 08/15/2013 0945   AST 25 08/16/2013 0720   AST 26 08/15/2013 0945   ALT 11 08/16/2013 0720   ALT 12 08/15/2013 0945   BILITOT 0.2* 08/16/2013 0720   BILITOT 0.38 08/15/2013 0945       RADIOGRAPHIC STUDIES:  ASSESSMENT AND PLAN: This is a very pleasant 73 years old African American female recently diagnosed  with extensive stage small cell lung cancer presented with left lower lobe lung mass in addition to mediastinal and right supraclavicular lymphadenopathy as well as hypermetabolic right upper lobe lung nodule in addition to liver and skeletal metastasis. The patient is currently poorly responsive and hypotensive. We started IV hydration and the patient was transferred to the emergency Department for further evaluation and consideration of admission for management of her condition Disclaimer: This note was dictated with voice recognition software. Similar sounding words can  inadvertently be transcribed and may not be corrected upon review.       

## 2013-08-25 NOTE — Procedures (Signed)
Central Venous Catheter Insertion Procedure Note Aleera Gilcrease [id number] [birth year]  Procedure: Insertion of Central Venous Catheter Indications: Assessment of intravascular volume, Drug and/or fluid administration and Frequent blood sampling  Procedure Details Consent: Risks of procedure as well as the alternatives and risks of each were explained to the (patient/caregiver).  Consent for procedure obtained. Time Out: Verified patient identification, verified procedure, site/side was marked, verified correct patient position, special equipment/implants available, medications/allergies/relevent history reviewed, required imaging and test results available.  Performed  Maximum sterile technique was used including antiseptics, cap, gloves, gown, hand hygiene, mask and sheet. Skin prep: Chlorhexidine; local anesthetic administered A antimicrobial bonded/coated triple lumen catheter was placed in the left subclavian vein using the Seldinger technique. U/S used in placement.  Evaluation Blood flow good Complications: No apparent complications Patient did tolerate procedure well. Chest X-ray ordered to verify placement.  CXR: pending.  YACOUB,WESAM 09/02/2013, 4:54 PM

## 2013-08-25 NOTE — Progress Notes (Signed)
Patient in the ICU. Patient with increasing work of breathing. Central line per chest x-ray looking like he may be in the aorta per radiology. Will check ABG. Place patient on BiPAP. Have informed Dr. Joya Gaskins of PCCM. of line placement and increased work of breathing. Follow.

## 2013-08-25 NOTE — H&P (Signed)
[facility] History and Physical  Emma Douglas WGY:[id number] DOB: Aug 17, 1940 DOA: 08/11/2013  Referring physician: Dr. Tamera Punt PCP: Karis Juba, PA-C   Chief Complaint: Syncope  HPI: Emma Douglas is a 73 y.o. female  With history of extensive stage small cell lung cancer diagnosed in June of 2015, atrial flutter, hypothyroidism, hypertension, hyperlipidemia, colonic polyps, COPD, anemia, history of subdural hematoma in 2013 who presents to the ED from the [facility] with a syncopal episode. Patient can't remember exactly what happened. However per ED physician's note patient was at the cancer center to be evaluated Rom[place] in a clinical trial when she started feeling nauseous and hot went to the bathroom had a large watery bowel movement which patient describes as soup and subsequently had a syncopal episode. Patient was noted to be hypotensive with systolic blood pressures in the 60s and was brought to the emergency room. In the ED patient was noted to have a blood pressure of 70/45 with some garbled speech. Patient did not have any focal neurological deficits. Patient complained of generalized weakness and some bilateral leg pain had been ongoing for several days with no swelling. Patient denied any recent fevers, no chills, no nausea, no vomiting, no chest pain, no constipation, no dysuria, no cough, no melena, no hematemesis, no hematochezia. Patient does endorse worsening shortness of breath and had recently been started on oxygen at home. Patient was seen in the emergency room was given about 2 L of IV fluids however systolic blood pressures remained in the 70s. Comprehensive metabolic profile obtained at a chloride of 93 BUN of 89 creatinine of 4.0 to albumin of 2.1 otherwise was within normal limits. LDH was elevated at 2057. Lactic acid was elevated at 4.85. CBC had a white count of 13.3 hemoglobin of 10.6. D-dimer was 11.29. CBC was 119. Urinalysis which was done was nitrite  negative leukocytes negative moderate hemoglobin 3-6 RBCs. CT head which was done showed no evidence of acute intracranial abnormality. Unchanged chronic small bifrontal subdural hematomas. EKG showed atrial fibrillation. Patient also noted to be hypothermic with a temperature of 95.5 and was placed on the bear hugger. Critical care was called, and asked hospitaslist to admit and they would consult on the patient per ED physician. We were subsequently called to admit the patient for further evaluation and management.    Review of Systems: As per history of present illness otherwise negative. Constitutional:  No weight loss, night sweats, Fevers, chills, fatigue.  HEENT:  No headaches, Difficulty swallowing,Tooth/dental problems,Sore throat,  No sneezing, itching, ear ache, nasal congestion, post nasal drip,  Cardio-vascular:  No chest pain, Orthopnea, PND, swelling in lower extremities, anasarca, dizziness, palpitations  GI:  No heartburn, indigestion, abdominal pain, nausea, vomiting, diarrhea, change in bowel habits, loss of appetite  Resp:  No shortness of breath with exertion or at rest. No excess mucus, no productive cough, No non-productive cough, No coughing up of blood.No change in color of mucus.No wheezing.No chest wall deformity  Skin:  no rash or lesions.  GU:  no dysuria, change in color of urine, no urgency or frequency. No flank pain.  Musculoskeletal:  No joint pain or swelling. No decreased range of motion. No back pain.  Psych:  No change in mood or affect. No depression or anxiety. No memory loss.   Past Medical History  Diagnosis Date  . Compensated hypothyroidism   . Essential hypertension   . Dyslipidemia   . Colon polyps   . Anxiety   . COPD (  chronic obstructive pulmonary disease)     secondary to smoking.  . Avascular necrosis     right femoral head  . Hyperlipidemia   . Anemia      Postoperative hemorrhagic anemia  . Allergic reaction 10/24/2012  .  Arthritis     "hands" (07/31/2013)  . Lung mass     /CT 07/11/2013  . Mass of stomach     /CT 07/11/2013: EGD 08/01/13 negative for mass  . CAP (community acquired pneumonia) 07/30/2013    "first time I've ever had pneumonia"  . Atrial flutter with rapid ventricular response     Archie Endo 07/31/2013  . Subdural hemorrhage following injury 2013    fall/notes 07/30/2013   Past Surgical History  Procedure Laterality Date  . Colonoscopy      with polypectomy and biopsy  . Total hip arthroplasty Right 2002  . Dilation and curettage of uterus    . Tubal ligation    . Esophagogastroduodenoscopy (egd) with propofol N/A 08/01/2013    Procedure: ESOPHAGOGASTRODUODENOSCOPY (EGD) WITH PROPOFOL;  Surgeon: Jeryl Columbia, MD;  Location[facility] END[facility]COPY;  Service: Endoscopy;  Laterality: N/A;  . Video bronchoscopy Bilateral 08/04/2013    Procedure: VIDEO BRONCHOSCOPY WITH FLUORO;  Surgeon: Elsie Stain, MD;  Location: [place];  Service: Cardiopulmonary;  Laterality: Bilateral;  . Video bronchoscopy N/A 08/04/2013    Procedure: VIDEO BRONCHOSCOPY WITH FLUORO;  Surgeon: Elsie Stain, MD;  Location: [place];  Service: Cardiopulmonary;  Laterality: N/A;   Social History:  reports that she has been smoking Cigarettes.  She has a 9.5 pack-year smoking history. She has never used smokeless tobacco. She reports that she drinks alcohol. She reports that she does not use illicit drugs.  Allergies  Allergen Reactions  . Black Pepper [[place]] Anaphylaxis  . Tomato Anaphylaxis    Has epi pen    Family History  Problem Relation Age of Onset  . Heart attack Mother   . Heart attack Father      Prior to Admission medications   Medication Sig Start Date End Date Taking? Authorizing Provider  atorvastatin (LIPITOR) 20 MG tablet Take 1 tablet (20 mg total) by mouth at bedtime. 08/06/13  Yes Barton Dubois, MD  diltiazem (CARDIZEM) 60 MG tablet Take 1 tablet (60 mg total) by mouth every 8 (eight) hours. 08/06/13  Yes  Barton Dubois, MD  Docusate Sodium (DSS) 100 MG CAPS Take 200 mg by mouth at bedtime as needed (constipation). 08/06/13  Yes Barton Dubois, MD  ENSURE (ENSURE) Take 237 mLs by mouth 3 (three) times daily between meals. VANILLA.   Yes Historical Provider, MD  EPINEPHrine (EPI-PEN) 0.3 mg/0.3 mL SOAJ injection Inject 0.3 mLs (0.3 mg total) into the muscle once. 10/24/12  Yes Mary B Dixon, PA-C  furosemide (LASIX) 40 MG tablet Take 1 tablet (40 mg total) by mouth daily. 08/19/13  Yes Velvet Bathe, MD  Ipratropium-Albuterol (COMBIVENT) 20-100 MCG/ACT AERS respimat Inhale 1 puff into the lungs every 6 (six) hours. 08/06/13  Yes Barton Dubois, MD  levothyroxine (SYNTHROID, LEVOTHROID) 100 MCG tablet Take 100 mcg by mouth daily before breakfast.   Yes Historical Provider, MD  lisinopril (PRINIVIL,ZESTRIL) 10 MG tablet Take 1 tablet (10 mg total) by mouth daily. 07/09/13  Yes Orlena Sheldon, PA-C  Multiple Vitamin (MULTIVITAMIN) tablet Take 1 tablet by mouth daily.   Yes Historical Provider, MD  nicotine (NICODERM CQ - DOSED IN MG/24 HOURS) 14 mg/24hr patch Place 1 patch (14 mg total) onto the skin  daily. 08/19/13  Yes Velvet Bathe, MD  polyethylene glycol (MIRALAX / GLYCOLAX) packet Take 17 g by mouth daily as needed for moderate constipation. 08/06/13  Yes Barton Dubois, MD  traMADol (ULTRAM) 50 MG tablet Take 50 mg by mouth every 6 (six) hours as needed for moderate pain.   Yes Historical Provider, MD  aspirin 81 MG EC tablet Take 81 mg by mouth daily. 08/19/13   Velvet Bathe, MD  prochlorperazine (COMPAZINE) 10 MG tablet Take 1 tablet (10 mg total) by mouth every 6 (six) hours as needed for nausea or vomiting. 08/15/13   Curt Bears, MD   Physical Exam: Filed Vitals:   08/29/2013 1530  BP: 86/38  Pulse:   Temp:   Resp: 14    BP 86/38  Pulse 84  Temp(Src) 95.5 F (35.3 C) (Core (Comment))  Resp 14  Wt 45.36 kg (100 lb)  SpO2 97%  General:  Cachectic frail elderly female lying on gurney in no acute  cardiopulmonary distress. Eyes: PERRA, EOMI, normal lids, irises & conjunctiva ENT: grossly normal hearing, lips & tongue. Poor dentition. Dry mucous membranes. Neck: no LAD, masses or thyromegaly Cardiovascular: Irregularly irregular. No LE edema. Telemetry: Fluctuating between atrial flutter, atrial fibrillation. Respiratory: Right lung clear to auscultation. Decreased breath sounds on the left. No wheezing, no crackles, no rhonchi.  Abdomen: soft, ntnd, positive bowel sounds, no rebound, no guarding. Skin: no rash or induration seen on limited exam Musculoskeletal: grossly normal tone BUE/BLE Psychiatric: grossly normal mood and affect, speech fluent and appropriate Neurologic: Alert and oriented x3. Cranial nerves II through XII are grossly intact. No focal deficits.           Labs on Admission:  Basic Metabolic Panel:  Recent Labs Lab 08/19/13 0354 09/02/2013 1129 08/11/2013 1350 08/24/2013 1404  NA 136* 140 140 137  K 5.0 4.3 4.2 4.0  CL 93*  --  93* 97  CO2 27 25 23   --   GLUCOSE 100* 135 119* 119*  BUN 16 89.0* 89* 97*  CREATININE 0.86 4.2* 4.02* 4.50*  CALCIUM 9.9 10.7* 9.6  --   MG  --  2.6*  --   --   PHOS  --  7.1*  --   --    Liver Function Tests:  Recent Labs Lab 08/19/2013 1129 08/18/2013 1350  AST 34 35  ALT 20 16  ALKPHOS 115 106  BILITOT 0.26 <0.2*  PROT 7.4 6.8  ALBUMIN 2.1* 2.1*   No results found for this basename: LIPASE, AMYLASE,  in the last 168 hours No results found for this basename: AMMONIA,  in the last 168 hours CBC:  Recent Labs Lab 08/18/2013 1129 08/26/2013 1350 08/05/2013 1404  WBC 15.0* 13.3*  --   NEUTROABS 12.1* 11.8*  --   HGB 11.5* 10.6* 12.2  HCT 36.0 32.8* 36.0  MCV 98.1 96.8  --   PLT 465* 513*  --    Cardiac Enzymes: No results found for this basename: CKTOTAL, CKMB, CKMBINDEX, TROPONINI,  in the last 168 hours  BNP (last 3 results)  Recent Labs  07/30/13 1644 08/16/13 0147  PROBNP 2688.0* 381.6*   CBG: No results  found for this basename: GLUCAP,  in the last 168 hours  Radiological Exams on Admission: Ct Head Wo Contrast  08/31/2013   CLINICAL DATA:  Syncope and fall.  EXAM: CT HEAD WITHOUT CONTRAST  TECHNIQUE: Contiguous axial images were obtained from the base of the skull through the vertex without intravenous contrast.  COMPARISON:  Brain MRI 08/08/2013  FINDINGS: There is no evidence of acute cortical infarct, intracranial hemorrhage, mass, or midline shift. 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 Patchy hypodensities in the periventricular white matter are compatible with mild chronic small vessel ischemic disease. There is mild cerebral atrophy.  Orbits are unremarkable. Mastoid air cells are clear. Left maxillary sinus mucous retention cyst is partially visualized.  IMPRESSION: 1. No evidence of acute intracranial abnormality. 2. Unchanged, chronic small bifrontal subdural hematomas.   Electronically Signed   By: Logan Bores   On: 08/09/2013 14:42   Dg Chest Portable 1 View  09/02/2013   CLINICAL DATA:  NEAR SYNCOPE  EXAM: PORTABLE CHEST - 1 VIEW  COMPARISON:  Prior radiograph from 08/16/2013  FINDINGS: Cardiac and mediastinal silhouettes are largely obscured. Bulky right hilar adenopathy is grossly similar.  There is essentially complete opacification of the left hemi thorax, likely related to postobstructive consolidation from known left lower lobe lung mass. A component of pleural effusion may also be present.  Aerated right lung is grossly clear. Known right upper lobe mass is grossly stable. No pneumothorax.  No acute osseous abnormality.  Prominent degenerative changes noted about the shoulders bilaterally.  IMPRESSION: 1. Complete opacification of the left hemi thorax, likely related to in large part postobstructive consolidation from known left lower lobe lung mass. A component of pleural effusion may also be present. 2. Grossly stable bulky hilar adenopathy.   Electronically Signed    By: Jeannine Boga M.D.   On: 08/14/2013 14:28    EKG: Independently reviewed. Atrial fibrillation with a rate of 91.  Assessment/Plan Principal Problem:   Syncope and collapse Active Problems:   Hypothyroidism   Dehydration   Hypotension   Atrial flutter with rapid ventricular response   Hilar adenopathy being evaluated by Dr. Rudene Christians cell lung cancer   Protein-calorie malnutrition, severe   Hypothermia   ARF (acute renal failure)   Collapse of left lung   Diarrhea   Positive D dimer  #1 syncope and collapse Likely secondary to hypotension as patient was noted to have systolic blood pressures in the 60s at the [place]. Patient's blood pressures remained in the 70s in the emergency room despite 2-3 L of IV fluids. CT of the head was negative for any abnormalities. Patient currently alert and following commands. We'll cycle cardiac enzymes every 6 hours x3. Patient with recent 2-D echo and a such we'll not repeat. Continue IV fluids. Patient will be started on pressors. Follow.  #2 hypotension Questionable etiology. Differential includes hypovolemia versus infectious etiology versus adrenal insufficiency versus cardiac etiology versus hypothyroidism. Patient denies any chest pain. Patient has received 2 L of IV fluids with no significant improvement with hypotension. Blood cultures have been obtained and are pending. Urinalysis is nitrite negative and leukocytes negative. EKG with atrial fibrillation. Urine cultures are pending. Lactic acid level is elevated. Will check a pro calcitonin level. Check a cortisol level. Check a TSH. Cycle cardiac enzymes every 6 hours x3. Will check a urine Legionella antigen. Check a urine pneumococcus antigen. Give another bolus of IV fluids. Keep on normal saline at 100 cc per hour. Place on stress dose steroids of hydrocortisone 100 mg IV every 6 hours. Patient has been started on Levothroid drip per ED physician. Place on empiric IV  vancomycin and IV Zosyn. Critical care has been consulted and will be following the patient.  #3 hypothermia Questionable etiology. Differential includes infectious etiology versus adrenal  insufficiency versus hypothyroidism. Patient has been pancultured. Lactic acid level was elevated. Check a pro calcitonin level. Check a urine Legionella antigen. Check a urine pneumococcus antigen. Continue Customer service manager. Place on stress dose steroids. Place on empiric IV vancomycin IV Zosyn. Follow.  #4 acute renal failure Likely secondary to prerenal azotemia as patient was noted to be hypotensive in the setting of lisinopril and Lasix. Patient did have a CT angiogram of the chest which was approximately a month ago. Check a urine creatinine. Check a urine sodium. Check a renal ultrasound. Place a Foley catheter. IV fluids. Will follow. If no improvement or worsening will consult with nephrology. Follow.  #5 probable SIRS Patient noted to be hypothermic, elevated lactic acid level, with a leukocytosis white count initially of 15,000 also noted to be hypotensive. Blood cultures have been obtained and are pending. Urine cultures are pending. Will check a pro calcitonin level. Check a urine Legionella antigen. Check a urine pneumococcus antigen. Will place empirically on IV vancomycin IV Zosyn.  #6 hypertension Oh blood pressure medications secondary to problem #2.  #7 anemia No overt GI bleed. Check an anemia panel. Follow H&H.  #8 dehydration IV fluids.  #9 collapsed left lung Secondary to extensive stage small cell lung cancer. Pulmonary critical care consult.  #10 extensive stage small cell lung cancer Will inform Dr. Earlie Server of admission. Will have oncology follow during the hospitalization and consult.  #11 hyperlipidemia Continue statin.  #12 COPD Stable. Continue Combivent. Nebulizers as needed.  #13 positive d-dimer May be secondary to extensive stage small cell lung cancer. Patient however  with some complaints of leg pain and had presented with a syncopal episode and hypotensive. Due to opacification of left lung not sure of how informative a VQ scan would be. Patient also in acute renal failure and a such unable to get a CT angiogram. Will check lower extremity Dopplers. Follow.  #14 diarrhea Check a C. difficile PCR. Contact precautions.  #15 severe protein calorie malnutrition Place on additional.  #16 hypothyroidism Check a TSH. Continue home dose Synthroid.  #17 prophylaxis PPI for GI prophylaxis. Heparin for DVT prophylaxis.   Code Status: Full Family Communication: Updated patient no family at bedside. Disposition Plan: Admit to ICU  Time spent: Lindsey  MD [facility] Pager [phone]  **Disclaimer: This note may have been dictated with voice recognition software. Similar sounding words can inadvertently be transcribed and this note may contain transcription errors which may not have been corrected upon publication of note.**

## 2013-08-25 NOTE — Progress Notes (Signed)
Pt tolerated placement of femoral central line without difficulty.  New CVP measure when complete was 16-86mmHG.  Eddie Dibbles NP was at bedside. Previous left IJ central line was removed without difficult as well.  Site clean, dry and intact.  NP at bedside as well.  Dr. Joya Gaskins notified of pt increased work of breathing, and increased HR.  Orders received for decrease in NS, but no other orders at current time.  Elink will continue to monitor.

## 2013-08-25 NOTE — Progress Notes (Signed)
Patient arrived from ED. Patient pressure hypotensive. Levophed running through CVL. Xray is needed for CVL placement verification. Upon placement verification the line not in correct place. SVo2 and CVP pressure to be taken to determine if line can be used per Dr Joya Gaskins CCM. Levophed switched to PIV. Patient increasingly SOB. Switched from Nasal Cannula to NRB. Patient still SOB and respiratory rate and work of breathing increased. Dr. Grandville Silos [facility] at bedside ordered BIPAP. Patient unable to tolerate bipap. Will continue to monitor.

## 2013-08-25 NOTE — Progress Notes (Signed)
RT placed PT on FFM BiPAP - started IPAP at 12 and EPAP at 6. PT did not tolerate pressures reduced as low as 5/5 and still did not tolerate. PT remains on NRB- RN and MD aware.

## 2013-08-25 NOTE — Procedures (Signed)
Central Venous Catheter Insertion Procedure Note Emma Douglas [id number] 30-Jul-1940  Procedure: Insertion of Central Venous Catheter Indications: Assessment of intravascular volume, Drug and/or fluid administration and Frequent blood sampling  Procedure Details Consent: Unable to obtain consent because of emergent medical necessity. Time Out: Verified patient identification, verified procedure, site/side was marked, verified correct patient position, special equipment/implants available, medications/allergies/relevent history reviewed, required imaging and test results available.  Performed  Maximum sterile technique was used including antiseptics, cap, gloves, gown, hand hygiene, mask and sheet. Skin prep: Chlorhexidine; local anesthetic administered A antimicrobial bonded/coated triple lumen catheter was placed in the right femoral vein due to multiple attempts, no other available access using the Seldinger technique. Ultrasound guidance used.yes Catheter placed to 20 cm. Blood aspirated via all 3 ports and then flushed x 3. Line sutured x 2 and dressing applied.  Evaluation Blood flow good Complications: No apparent complications Patient did tolerate procedure well.   [name], ACNP [facility] Pulmonology/Critical Care Pager [phone] or [phone]   Kara Mead MD. Shade Flood.

## 2013-08-25 NOTE — Consult Note (Signed)
PULMONARY / CRITICAL CARE MEDICINE   Name: Emma Douglas MRN: [id number] DOB: Sep 05, 1940    ADMISSION DATE:  08/09/2013 CONSULTATION DATE:  08/24/2013  REFERRING MD :  Dr. Grandville Silos PRIMARY SERVICE: TRH  CHIEF COMPLAINT:  Septic Shock  BRIEF PATIENT DESCRIPTION: 73 year old female with extensive stage lung cancer and COPD presenting with syncope.  Patient in the ED was found to be hypotensive and CXR revealed large left sided white out.  PCCM was consulted for those reasons.  SIGNIFICANT EVENTS / STUDIES:  6/22 syncope  LINES / TUBES: L [place] TLC 6/22>>>  CULTURES: Blood 6/22>>> Urine 6/22>>> Sputum 6/22>>>  ANTIBIOTICS: Vancomycin 6/22>>> Zosyn 6/22>>>  PAST MEDICAL HISTORY :  Past Medical History  Diagnosis Date  . Compensated hypothyroidism   . Essential hypertension   . Dyslipidemia   . Colon polyps   . Anxiety   . COPD (chronic obstructive pulmonary disease)     secondary to smoking.  . Avascular necrosis     right femoral head  . Hyperlipidemia   . Anemia      Postoperative hemorrhagic anemia  . Allergic reaction 10/24/2012  . Arthritis     "hands" (07/31/2013)  . Lung mass     /CT 07/11/2013  . Mass of stomach     /CT 07/11/2013  . CAP (community acquired pneumonia) 07/30/2013    "first time I've ever had pneumonia"  . Atrial flutter with rapid ventricular response     Archie Endo 07/31/2013  . Subdural hemorrhage following injury 2013    fall/notes 07/30/2013   Past Surgical History  Procedure Laterality Date  . Colonoscopy      with polypectomy and biopsy  . Total hip arthroplasty Right 2002  . Dilation and curettage of uterus    . Tubal ligation    . Esophagogastroduodenoscopy (egd) with propofol N/A 08/01/2013    Procedure: ESOPHAGOGASTRODUODENOSCOPY (EGD) WITH PROPOFOL;  Surgeon: Jeryl Columbia, MD;  Location: [facility] ENDOSCOPY;  Service: Endoscopy;  Laterality: N/A;  . Video bronchoscopy Bilateral 08/04/2013    Procedure: VIDEO BRONCHOSCOPY WITH FLUORO;   Surgeon: Elsie Stain, MD;  Location: [place];  Service: Cardiopulmonary;  Laterality: Bilateral;  . Video bronchoscopy N/A 08/04/2013    Procedure: VIDEO BRONCHOSCOPY WITH FLUORO;  Surgeon: Elsie Stain, MD;  Location: [place];  Service: Cardiopulmonary;  Laterality: N/A;   Prior to Admission medications   Medication Sig Start Date End Date Taking? Authorizing Provider  atorvastatin (LIPITOR) 20 MG tablet Take 1 tablet (20 mg total) by mouth at bedtime. 08/06/13  Yes Barton Dubois, MD  diltiazem (CARDIZEM) 60 MG tablet Take 1 tablet (60 mg total) by mouth every 8 (eight) hours. 08/06/13  Yes Barton Dubois, MD  Docusate Sodium (DSS) 100 MG CAPS Take 200 mg by mouth at bedtime as needed (constipation). 08/06/13  Yes Barton Dubois, MD  ENSURE (ENSURE) Take 237 mLs by mouth 3 (three) times daily between meals. VANILLA.   Yes Historical Provider, MD  EPINEPHrine (EPI-PEN) 0.3 mg/0.3 mL SOAJ injection Inject 0.3 mLs (0.3 mg total) into the muscle once. 10/24/12  Yes Mary B Dixon, PA-C  furosemide (LASIX) 40 MG tablet Take 1 tablet (40 mg total) by mouth daily. 08/19/13  Yes Velvet Bathe, MD  Ipratropium-Albuterol (COMBIVENT) 20-100 MCG/ACT AERS respimat Inhale 1 puff into the lungs every 6 (six) hours. 08/06/13  Yes Barton Dubois, MD  levothyroxine (SYNTHROID, LEVOTHROID) 100 MCG tablet Take 100 mcg by mouth daily before breakfast.   Yes Historical Provider, MD  lisinopril (PRINIVIL,ZESTRIL) 10 MG tablet Take 1 tablet (10 mg total) by mouth daily. 07/09/13  Yes Orlena Sheldon, PA-C  Multiple Vitamin (MULTIVITAMIN) tablet Take 1 tablet by mouth daily.   Yes Historical Provider, MD  nicotine (NICODERM CQ - DOSED IN MG/24 HOURS) 14 mg/24hr patch Place 1 patch (14 mg total) onto the skin daily. 08/19/13  Yes Velvet Bathe, MD  polyethylene glycol (MIRALAX / GLYCOLAX) packet Take 17 g by mouth daily as needed for moderate constipation. 08/06/13  Yes Barton Dubois, MD  traMADol (ULTRAM) 50 MG tablet Take 50 mg  by mouth every 6 (six) hours as needed for moderate pain.   Yes Historical Provider, MD  aspirin 81 MG EC tablet Take 81 mg by mouth daily. 08/19/13   Velvet Bathe, MD  prochlorperazine (COMPAZINE) 10 MG tablet Take 1 tablet (10 mg total) by mouth every 6 (six) hours as needed for nausea or vomiting. 08/15/13   Curt Bears, MD   Allergies  Allergen Reactions  . Black Pepper [[place]] Anaphylaxis  . Tomato Anaphylaxis    Has epi pen    FAMILY HISTORY:  Family History  Problem Relation Age of Onset  . Heart attack Mother   . Heart attack Father    SOCIAL HISTORY:  reports that she has been smoking Cigarettes.  She has a 9.5 pack-year smoking history. She has never used smokeless tobacco. She reports that she drinks alcohol. She reports that she does not use illicit drugs.  REVIEW OF SYSTEMS:  Patient is encephalopathic, history is less than reliable.  SUBJECTIVE:   VITAL SIGNS: Temp:  [95.5 F (35.3 C)-97.4 F (36.3 C)] 95.5 F (35.3 C) (06/22 1513) Pulse Rate:  [84] 84 (06/22 1240) Resp:  [14-23] 14 (06/22 1530) BP: (72-96)/(38-45) 86/38 mmHg (06/22 1530) SpO2:  [97 %] 97 % (06/22 1546) Weight:  [100 lb (45.36 kg)] 100 lb (45.36 kg) (06/22 1240) HEMODYNAMICS:   VENTILATOR SETTINGS:   INTAKE / OUTPUT: Intake/Output     06/21 0701 - 06/22 0700 06/22 0701 - 06/23 0700   Urine (mL/kg/hr)  10   Total Output   10   Net   -10          PHYSICAL EXAMINATION: General:  Chronically ill appearing elderly female. Neuro:  Confused but moving all ext to command. HEENT:  [place]/AT, PERRL, EOM-I and DMM. Cardiovascular:  RRR, Nl S1/S2, -M/R/G. Lungs:  No BS on the left, mild wheezes on the right. Abdomen:  Soft, NT, ND and +BS. Musculoskeletal:  -edema and -tenderness. Skin:  Intact.  LABS:  CBC  Recent Labs Lab 08/27/2013 1129 08/14/2013 1350 09/01/2013 1404  WBC 15.0* 13.3*  --   HGB 11.5* 10.6* 12.2  HCT 36.0 32.8* 36.0  PLT 465* 513*  --    Coag's No results found for  this basename: APTT, INR,  in the last 168 hours BMET  Recent Labs Lab 08/19/13 0354 08/30/2013 1129 08/07/2013 1350 08/24/2013 1404  NA 136* 140 140 137  K 5.0 4.3 4.2 4.0  CL 93*  --  93* 97  CO2 27 25 23   --   BUN 16 89.0* 89* 97*  CREATININE 0.86 4.2* 4.02* 4.50*  GLUCOSE 100* 135 119* 119*   Electrolytes  Recent Labs Lab 08/19/13 0354 08/24/2013 1129 08/04/2013 1350  CALCIUM 9.9 10.7* 9.6  MG  --  2.6*  --   PHOS  --  7.1*  --    Sepsis Markers  Recent Labs Lab 08/07/2013 1404  LATICACIDVEN 4.85*   ABG No results found for this basename: PHART, PCO2ART, PO2ART,  in the last 168 hours Liver Enzymes  Recent Labs Lab 08/31/2013 1129 08/15/2013 1350  AST 34 35  ALT 20 16  ALKPHOS 115 106  BILITOT 0.26 <0.2*  ALBUMIN 2.1* 2.1*   Cardiac Enzymes No results found for this basename: TROPONINI, PROBNP,  in the last 168 hours Glucose No results found for this basename: GLUCAP,  in the last 168 hours  Imaging Ct Head Wo Contrast  08/23/2013   CLINICAL DATA:  Syncope and fall.  EXAM: CT HEAD WITHOUT CONTRAST  TECHNIQUE: Contiguous axial images were obtained from the base of the skull through the vertex without intravenous contrast.  COMPARISON:  Brain MRI 08/08/2013  FINDINGS: There is no evidence of acute cortical infarct, intracranial hemorrhage, mass, or midline shift. 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 Patchy hypodensities in the periventricular white matter are compatible with mild chronic small vessel ischemic disease. There is mild cerebral atrophy.  Orbits are unremarkable. Mastoid air cells are clear. Left maxillary sinus mucous retention cyst is partially visualized.  IMPRESSION: 1. No evidence of acute intracranial abnormality. 2. Unchanged, chronic small bifrontal subdural hematomas.   Electronically Signed   By: Logan Bores   On: 08/05/2013 14:42   Dg Chest Portable 1 View  09/01/2013   CLINICAL DATA:  NEAR SYNCOPE  EXAM: PORTABLE CHEST - 1  VIEW  COMPARISON:  Prior radiograph from 08/16/2013  FINDINGS: Cardiac and mediastinal silhouettes are largely obscured. Bulky right hilar adenopathy is grossly similar.  There is essentially complete opacification of the left hemi thorax, likely related to postobstructive consolidation from known left lower lobe lung mass. A component of pleural effusion may also be present.  Aerated right lung is grossly clear. Known right upper lobe mass is grossly stable. No pneumothorax.  No acute osseous abnormality.  Prominent degenerative changes noted about the shoulders bilaterally.  IMPRESSION: 1. Complete opacification of the left hemi thorax, likely related to in large part postobstructive consolidation from known left lower lobe lung mass. A component of pleural effusion may also be present. 2. Grossly stable bulky hilar adenopathy.   Electronically Signed   By: Jeannine Boga M.D.   On: 08/22/2013 14:28     CXR: L white out.  ASSESSMENT / PLAN:  PULMONARY A: Lung cancer PNA ?large left sided effusion P:   - When vitals are more stable will need a chest CT for ?thora. - See ID. - Supplemental O2. - Would not intubate unless patient fails BiPAP first. - Needs EOL discussion.  CARDIOVASCULAR A: Septic shock. P:  - Sepsis protocol. - Levophed.  RENAL A:  ARF, baseline unknown P:   - Hydrate. - Daily BMET. - Replace electrolytes as indicated.  GASTROINTESTINAL A: Severe malnutrition from end stage cancer.. P:   - Will need EOL discussion.  HEMATOLOGIC A:  High WBC from infection and likely high platelets from cancer and inflammation. P:  - Daily CBC. - Treat infection. - Will need H/O to see.  INFECTIOUS A:  L PNA. P:   - Pan culture. - Vanc/zosyn.  ENDOCRINE A:  ?adrenal insufficiency.   P:   - Cortisol level. - Stress dose steroids.  NEUROLOGIC A:  Confusion likely due to infection. P:   - Monitor. - Avoid sedative.  TODAY'S SUMMARY: L PNA, needs EOL  discussion as insists on full code but is encephalopathic, no family bedside.  Sepsis protocol.  I have personally  obtained a history, examined the patient, evaluated laboratory and imaging results, formulated the assessment and plan and placed orders. CRITICAL CARE: The patient is critically ill with multiple organ systems failure and requires high complexity decision making for assessment and support, frequent evaluation and titration of therapies, application of advanced monitoring technologies and extensive interpretation of multiple databases. Critical Care Time devoted to patient care services described in this note is 45 minutes.   Rush Farmer, M.D. [facility] Pulmonary/Critical Care Medicine. Pager: [phone]. After hours pager: [phone].  08/26/2013, 4:22 PM

## 2013-08-25 NOTE — ED Notes (Signed)
Patient from cancer center via stretcher, nearly passed out in bathroom at cancer center.  Patient had not had a chemo treatment today.

## 2013-08-25 NOTE — Progress Notes (Signed)
08/07/2013 Patient in to clinic today for evaluation (labwork and H&P) prior to beginning treatment cycle 1, scheduled for 08/26/2013. Due to an acute medical event at the [facility] today, patient has been transported to the [facility] Emergency Department for evaluation. FACT/GOG-Ntx was to be completed by patient at today's visit, prior to receiving chemotherapy on 08/26/2013, however, this could not be completed due to the patient's condition. Cindy S. Brigitte Pulse BSN, RN, CCRP 08/15/2013 1:10 PM

## 2013-08-26 ENCOUNTER — Other Ambulatory Visit: Payer: Medicare Other

## 2013-08-26 ENCOUNTER — Inpatient Hospital Stay (HOSPITAL_COMMUNITY): Payer: Medicare Other

## 2013-08-26 ENCOUNTER — Ambulatory Visit
Admit: 2013-08-26 | Discharge: 2013-08-26 | Disposition: A | Discharge status: Home / Self Care | Payer: Medicare Other | Attending: Radiation Oncology | Admitting: Radiation Oncology

## 2013-08-26 ENCOUNTER — Ambulatory Visit: Payer: Medicare Other

## 2013-08-26 ENCOUNTER — Ambulatory Visit: Payer: Medicare Other | Admitting: Physician Assistant

## 2013-08-26 DIAGNOSIS — I959 Hypotension, unspecified: Secondary | ICD-10-CM

## 2013-08-26 DIAGNOSIS — R55 Syncope and collapse: Secondary | ICD-10-CM

## 2013-08-26 DIAGNOSIS — J9819 Other pulmonary collapse: Secondary | ICD-10-CM

## 2013-08-26 DIAGNOSIS — J189 Pneumonia, unspecified organism: Secondary | ICD-10-CM

## 2013-08-26 DIAGNOSIS — C787 Secondary malignant neoplasm of liver and intrahepatic bile duct: Secondary | ICD-10-CM

## 2013-08-26 DIAGNOSIS — D649 Anemia, unspecified: Secondary | ICD-10-CM

## 2013-08-26 DIAGNOSIS — D72819 Decreased white blood cell count, unspecified: Secondary | ICD-10-CM

## 2013-08-26 DIAGNOSIS — C7951 Secondary malignant neoplasm of bone: Secondary | ICD-10-CM

## 2013-08-26 DIAGNOSIS — C7952 Secondary malignant neoplasm of bone marrow: Secondary | ICD-10-CM

## 2013-08-26 DIAGNOSIS — C3430 Malignant neoplasm of lower lobe, unspecified bronchus or lung: Secondary | ICD-10-CM

## 2013-08-26 DIAGNOSIS — N179 Acute kidney failure, unspecified: Secondary | ICD-10-CM

## 2013-08-26 DIAGNOSIS — R197 Diarrhea, unspecified: Secondary | ICD-10-CM

## 2013-08-26 LAB — BODY FLUID CELL COUNT WITH DIFFERENTIAL
LYMPHS FL: 30 %
Monocyte-Macrophage-Serous Fluid: 6 % — ABNORMAL LOW (ref 50–90)
Neutrophil Count, Fluid: 64 % — ABNORMAL HIGH (ref 0–25)
Total Nucleated Cell Count, Fluid: UNDETERMINED cu mm (ref 0–1000)

## 2013-08-26 LAB — CBC
HCT: 33.5 % — ABNORMAL LOW (ref 36.0–46.0)
HEMOGLOBIN: 10.6 g/dL — AB (ref 12.0–15.0)
MCH: 30.9 pg (ref 26.0–34.0)
MCHC: 31.6 g/dL (ref 30.0–36.0)
MCV: 97.7 fL (ref 78.0–100.0)
Platelets: 464 10*3/uL — ABNORMAL HIGH (ref 150–400)
RBC: 3.43 MIL/uL — AB (ref 3.87–5.11)
RDW: 14.5 % (ref 11.5–15.5)
WBC: 14.3 10*3/uL — ABNORMAL HIGH (ref 4.0–10.5)

## 2013-08-26 LAB — COMPREHENSIVE METABOLIC PANEL
ALT: 32 U/L (ref 0–35)
AST: 51 U/L — AB (ref 0–37)
Albumin: 1.6 g/dL — ABNORMAL LOW (ref 3.5–5.2)
Alkaline Phosphatase: 164 U/L — ABNORMAL HIGH (ref 39–117)
BUN: 70 mg/dL — ABNORMAL HIGH (ref 6–23)
CALCIUM: 8 mg/dL — AB (ref 8.4–10.5)
CO2: 20 meq/L (ref 19–32)
CREATININE: 2.75 mg/dL — AB (ref 0.50–1.10)
Chloride: 100 mEq/L (ref 96–112)
GFR, EST AFRICAN AMERICAN: 18 mL/min — AB (ref >90)
GFR, EST NON AFRICAN AMERICAN: 16 mL/min — AB (ref >90)
Glucose, Bld: 189 mg/dL — ABNORMAL HIGH (ref 70–99)
Potassium: 4.3 mEq/L (ref 3.7–5.3)
Sodium: 136 mEq/L — ABNORMAL LOW (ref 137–147)
TOTAL PROTEIN: 5.6 g/dL — AB (ref 6.0–8.3)
Total Bilirubin: <0.2 mg/dL — ABNORMAL LOW (ref 0.3–1.2)

## 2013-08-26 LAB — PROCALCITONIN: PROCALCITONIN: 0.86 ng/mL

## 2013-08-26 LAB — URINE CULTURE
CULTURE: NO GROWTH
Colony Count: 70000
Colony Count: NO GROWTH

## 2013-08-26 LAB — BLOOD GAS, ARTERIAL
ACID-BASE DEFICIT: 5.6 mmol/L — AB (ref 0.0–2.0)
Bicarbonate: 19.9 mEq/L — ABNORMAL LOW (ref 20.0–24.0)
DRAWN BY: 39899
FIO2: 1 %
LHR: 16 {breaths}/min
MECHVT: 420 mL
O2 Saturation: 99.5 %
PEEP: 5 cmH2O
PO2 ART: 350 mmHg — AB (ref 80.0–100.0)
Patient temperature: 98.6
TCO2: 18.6 mmol/L (ref 0–100)
pCO2 arterial: 41.4 mmHg (ref 35.0–45.0)
pH, Arterial: 7.302 — ABNORMAL LOW (ref 7.350–7.450)

## 2013-08-26 LAB — PROTEIN, BODY FLUID: TOTAL PROTEIN, FLUID: 3.1 g/dL

## 2013-08-26 LAB — FERRITIN

## 2013-08-26 LAB — CHOLESTEROL, TOTAL: Cholesterol: 80 mg/dL (ref 0–200)

## 2013-08-26 LAB — LACTATE DEHYDROGENASE: LDH: 1497 U/L — AB (ref 94–250)

## 2013-08-26 LAB — LEGIONELLA ANTIGEN, URINE: LEGIONELLA ANTIGEN, URINE: NEGATIVE

## 2013-08-26 LAB — CORTISOL: Cortisol, Plasma: 21.1 ug/dL

## 2013-08-26 LAB — PROTEIN, TOTAL: Total Protein: 5.5 g/dL — ABNORMAL LOW (ref 6.0–8.3)

## 2013-08-26 LAB — CREATININE, URINE, RANDOM: Creatinine, Urine: 82.1 mg/dL

## 2013-08-26 LAB — LACTATE DEHYDROGENASE, PLEURAL OR PERITONEAL FLUID: LD, Fluid: 7264 U/L — ABNORMAL HIGH (ref 3–23)

## 2013-08-26 LAB — TROPONIN I
Troponin I: <0.3 ng/mL (ref <0.30)
Troponin I: <0.3 ng/mL (ref <0.30)

## 2013-08-26 LAB — VITAMIN B12: Vitamin B-12: 873 pg/mL (ref 211–911)

## 2013-08-26 LAB — FOLATE: FOLATE: 6.4 ng/mL

## 2013-08-26 LAB — LACTIC ACID, PLASMA: LACTIC ACID, VENOUS: 3 mmol/L — AB (ref 0.5–2.2)

## 2013-08-26 LAB — GLUCOSE, CAPILLARY: Glucose-Capillary: 170 mg/dL — ABNORMAL HIGH (ref 70–99)

## 2013-08-26 LAB — SODIUM, URINE, RANDOM: Sodium, Ur: 61 mEq/L

## 2013-08-26 MED ORDER — SODIUM CHLORIDE 0.9 % IV BOLUS (SEPSIS)
500.0000 mL | Freq: Once | INTRAVENOUS | Status: AC
  Administered 2013-08-26: 500 mL via INTRAVENOUS

## 2013-08-26 MED ORDER — CHLORHEXIDINE GLUCONATE 0.12 % MT SOLN
15.0000 mL | Freq: Two times a day (BID) | OROMUCOSAL | Status: DC
  Administered 2013-08-26 – 2013-08-28 (×5): 15 mL via OROMUCOSAL
  Filled 2013-08-26 (×5): qty 15

## 2013-08-26 MED ORDER — ATORVASTATIN CALCIUM 10 MG PO TABS
20.0000 mg | ORAL_TABLET | Freq: Every day | ORAL | Status: DC
  Administered 2013-08-26 – 2013-08-27 (×2): 20 mg
  Filled 2013-08-26 (×2): qty 2

## 2013-08-26 MED ORDER — PANTOPRAZOLE SODIUM 40 MG IV SOLR
40.0000 mg | INTRAVENOUS | Status: DC
  Administered 2013-08-26 – 2013-08-27 (×2): 40 mg via INTRAVENOUS
  Filled 2013-08-26 (×2): qty 40

## 2013-08-26 MED ORDER — ETOMIDATE 2 MG/ML IV SOLN
INTRAVENOUS | Status: AC
  Filled 2013-08-26: qty 20

## 2013-08-26 MED ORDER — ETOMIDATE 2 MG/ML IV SOLN
0.3000 mg/kg | Freq: Once | INTRAVENOUS | Status: AC
  Administered 2013-08-26: 20 mg via INTRAVENOUS
  Filled 2013-08-26: qty 6.96

## 2013-08-26 MED ORDER — ASPIRIN 81 MG PO CHEW: 81.0000 mg | CHEWABLE_TABLET | Freq: Once | ORAL | Status: DC

## 2013-08-26 MED ORDER — SUCCINYLCHOLINE CHLORIDE 20 MG/ML IJ SOLN
INTRAMUSCULAR | Status: AC
Start: 2013-08-26 — End: 2013-08-27
  Filled 2013-08-26: qty 1

## 2013-08-26 MED ORDER — ADULT MULTIVITAMIN LIQUID CH
5.0000 mL | Freq: Every day | ORAL | Status: DC
  Administered 2013-08-26 – 2013-08-27 (×2): 5 mL
  Filled 2013-08-26 (×3): qty 5

## 2013-08-26 MED ORDER — VITAL AF 1.2 CAL PO LIQD
1000.0000 mL | ORAL | Status: DC
  Administered 2013-08-26 – 2013-08-27 (×2): 1000 mL
  Filled 2013-08-26 (×3): qty 1000

## 2013-08-26 MED ORDER — BIOTENE DRY MOUTH MT LIQD: 15.0000 mL | Freq: Four times a day (QID) | OROMUCOSAL | Status: DC

## 2013-08-26 MED ORDER — BIOTENE DRY MOUTH MT LIQD
15.0000 mL | Freq: Four times a day (QID) | OROMUCOSAL | Status: DC
  Administered 2013-08-26 – 2013-08-28 (×8): 15 mL via OROMUCOSAL

## 2013-08-26 MED ORDER — METOPROLOL TARTRATE 1 MG/ML IV SOLN: 5.0000 mg | Freq: Once | INTRAVENOUS | Status: DC

## 2013-08-26 MED ORDER — CHLORHEXIDINE GLUCONATE 0.12 % MT SOLN: 15.0000 mL | Freq: Two times a day (BID) | OROMUCOSAL | Status: DC

## 2013-08-26 MED ORDER — ROCURONIUM BROMIDE 50 MG/5ML IV SOLN
INTRAVENOUS | Status: AC
  Filled 2013-08-26: qty 2

## 2013-08-26 MED ORDER — LEVOTHYROXINE SODIUM 50 MCG PO TABS
100.0000 ug | ORAL_TABLET | Freq: Every day | ORAL | Status: DC
  Administered 2013-08-27 – 2013-08-28 (×2): 100 ug
  Filled 2013-08-26 (×4): qty 2

## 2013-08-26 MED ORDER — DEXTROSE-NACL 5-0.45 % IV SOLN
INTRAVENOUS | Status: DC
  Administered 2013-08-26: 1000 mL via INTRAVENOUS
  Administered 2013-08-27: 09:00:00 via INTRAVENOUS

## 2013-08-26 MED ORDER — LIDOCAINE HCL (CARDIAC) 20 MG/ML IV SOLN
INTRAVENOUS | Status: AC
  Filled 2013-08-26: qty 5

## 2013-08-26 NOTE — Progress Notes (Signed)
Pt has continued to be hypotensive.  Neosynephrine at 268mcg,, 2 - 500cc NS boluses completed.  Map 63-65.  Elink aware. Continue to monitor.

## 2013-08-26 NOTE — Progress Notes (Signed)
A-line not providing a reading on monitor, will not draw. RT removed aline, held pressure until bleeding stopped and applied appropriate dressing. RN aware.

## 2013-08-26 NOTE — Progress Notes (Signed)
INITIAL NUTRITION ASSESSMENT  Pt meets criteria for severe MALNUTRITION in the context of chronic illness as evidenced by severe muscle wasting and subcutaneous fat loss throughout body.  DOCUMENTATION CODES Per approved criteria  -Severe malnutrition in the context of chronic illness -Underweight   INTERVENTION: - Initiate TF via OGT of Vital AF 1.2 start at 87m/hr increase by 127mevery 4 hours to goal of 4081mr. Goal rate will provide 1152 calories, 72g protein, 779m13mee water and meet 101% estimated calorie needs, 103% estimated protein needs - Multivitamin 5ml 39mly via OGT as goal rate of TF will not provide 100% of recommended daily multivitamin needs  - Initiate adult enteral protocol - RD to continue to monitor   NUTRITION DIAGNOSIS: Inadequate oral intake related to inability to eat as evidenced by NPO.   Goal: TF to meet >90% of estimated nutritional needs  Monitor:  Weights, labs, TF tolerance/advancement, vent status   Reason for Assessment: Low braden, malnutrition screening tool, ventilated pt   73 y.23 female  Admitting Dx: Syncope and collapse  ASSESSMENT: Pt with extensive stage small cell lung cancer diagnosed in June of 2015, atrial flutter, hypothyroidism, hypertension, hyperlipidemia, colonic polyps, COPD, anemia, history of subdural hematoma in 2013 who presents to the ED from the [facility] a syncopal episode. Intubated last night for respiratory insufficiency after not tolerating Bi-PAP. Per conversation with NP, plan is for RD to manage TF and start TF today.   Patient is currently intubated on ventilator support MV: 6.5 L/min Temp (24hrs), Avg:97.8 F (36.6 C), Min:95.5 F (35.3 C), Max:99 F (37.2 C)  Propofol: off    -Pt admitted earlier this month for shortness of breath with wheezing. Was seen by inpatient RD who noted pt had reported decreased appetite with pt consuming 2 small meals/day but was trying to drink 2 vanilla  Ensure/day.   BUN/Cr elevated with low GFR Phosphorus elevated Alk phos elevated AST elevated    Height: Ht Readings from Last 1 Encounters:  08/05/2013 _0  (1.676 m)    Weight: Wt Readings from Last 1 Encounters:  08/24/2013 102 lb 4.7 oz (46.4 kg)    Ideal Body Weight: 130 lbs   % Ideal Body Weight: 78%  Wt Readings from Last 10 Encounters:  08/12/2013 102 lb 4.7 oz (46.4 kg)  08/19/13 96 lb 8 oz (43.772 kg)  08/15/13 101 lb 11.2 oz (46.131 kg)  08/11/13 103 lb 8 oz (46.947 kg)  08/06/13 100 lb 15.5 oz (45.8 kg)  08/06/13 100 lb 15.5 oz (45.8 kg)  08/06/13 100 lb 15.5 oz (45.8 kg)  08/06/13 100 lb 15.5 oz (45.8 kg)  07/30/13 103 lb (46.72 kg)  07/29/13 102 lb 12.8 oz (46.63 kg)    Usual Body Weight: 100 lbs   % Usual Body Weight: 102%  BMI:  Body mass index is 16.52 kg/(m^2). Underweight   Estimated Nutritional Needs: Kcal: 1138 Protein: 70-85g Fluid: per MD  Skin: Generalized edema  Diet Order: NPO  EDUCATION NEEDS: -No education needs identified at this time   Intake/Output Summary (Last 24 hours) at 08/26/13 0853 Last data filed at 08/26/13 0600  Gross per 24 hour  Intake 9968.79 ml  Output    775 ml  Net 9193.79 ml    Last BM: PTA  Labs:   Recent Labs Lab 08/24/2013 1129  08/19/2013 1350 08/21/2013 1404 08/21/2013 1909 08/17/2013 1914 08/26/13 0445  NA 140  --  140 137  --   --  136*  K 4.3  --  4.2 4.0  --   --  4.3  CL  --   --  93* 97  --   --  100  CO2 25  --  23  --   --   --  20  BUN 89.0*  --  89* 97*  --   --  70*  CREATININE 4.2*  < > 4.02* 4.50*  --  3.26* 2.75*  CALCIUM 10.7*  --  9.6  --   --   --  8.0*  MG 2.6*  --   --   --  2.4  --   --   PHOS 7.1*  --   --   --   --   --   --   GLUCOSE 135  --  119* 119*  --   --  189*  < > = values in this interval not displayed.  CBG (last 3)   Recent Labs  08/26/13 0737  GLUCAP 170*    Scheduled Meds: . albuterol  2.5 mg Nebulization Q4H  . antiseptic oral rinse  15 mL Mouth  Rinse QID  . aspirin EC  81 mg Oral Daily  . atorvastatin  20 mg Oral QHS  . chlorhexidine  15 mL Mouth Rinse BID  . feeding supplement (ENSURE COMPLETE)  237 mL Oral TID BM  . heparin  5,000 Units Subcutaneous 3 times per day  . hydrocortisone sod succinate (SOLU-CORTEF) inj  100 mg Intravenous Q6H  . ipratropium  0.5 mg Nebulization Q4H  . levothyroxine  100 mcg Oral QAC breakfast  . nicotine  14 mg Transdermal Daily  . pantoprazole  40 mg Oral Q0600  . piperacillin-tazobactam (ZOSYN)  IV  2.25 g Intravenous 4 times per day  . sodium chloride  3 mL Intravenous Q12H  . [START ON 08/27/2013] vancomycin  500 mg Intravenous Q48H    Continuous Infusions: . sodium chloride 75 mL/hr at 08/12/2013 2100  . fentaNYL infusion INTRAVENOUS 75 mcg/hr (08/26/13 0600)  . phenylephrine (NEO-SYNEPHRINE) Adult infusion 200 mcg/min (08/26/13 1601)    Past Medical History  Diagnosis Date  . Compensated hypothyroidism   . Essential hypertension   . Dyslipidemia   . Colon polyps   . Anxiety   . COPD (chronic obstructive pulmonary disease)     secondary to smoking.  . Avascular necrosis     right femoral head  . Hyperlipidemia   . Anemia      Postoperative hemorrhagic anemia  . Allergic reaction 10/24/2012  . Arthritis     "hands" (07/31/2013)  . Lung mass     /CT 07/11/2013  . Mass of stomach     /CT 07/11/2013: EGD 08/01/13 negative for mass  . CAP (community acquired pneumonia) 07/30/2013    "first time I've ever had pneumonia"  . Atrial flutter with rapid ventricular response     Archie Endo 07/31/2013  . Subdural hemorrhage following injury 2013    fall/notes 07/30/2013    Past Surgical History  Procedure Laterality Date  . Colonoscopy      with polypectomy and biopsy  . Total hip arthroplasty Right 2002  . Dilation and curettage of uterus    . Tubal ligation    . Esophagogastroduodenoscopy (egd) with propofol N/A 08/01/2013    Procedure: ESOPHAGOGASTRODUODENOSCOPY (EGD) WITH PROPOFOL;   Surgeon: Jeryl Columbia, MD;  Location: [facility] ENDOSCOPY;  Service: Endoscopy;  Laterality: N/A;  . Video bronchoscopy Bilateral 08/04/2013    Procedure: VIDEO BRONCHOSCOPY WITH  FLUORO;  Surgeon: Elsie Stain, MD;  Location: [facility] ENDOSCOPY;  Service: Cardiopulmonary;  Laterality: Bilateral;  . Video bronchoscopy N/A 08/04/2013    Procedure: VIDEO BRONCHOSCOPY WITH FLUORO;  Surgeon: Elsie Stain, MD;  Location: [place];  Service: Cardiopulmonary;  Laterality: N/A;    Carlis Stable MS, [place], LDN [phone] Pager [phone] Weekend/After Hours Pager

## 2013-08-26 NOTE — Progress Notes (Addendum)
OT Cancellation Note  Patient Details Name: Emma Douglas MRN: [id number] DOB: [birth year]   Cancelled Treatment:    Reason Eval/Treat Not Completed: Medical issues which prohibited therapy. Note medical issues and pt is intubated. Spoke to the patient's nurse and will sign off for now and please reorder OT when pt medically ready for OOB.  Jules Schick [phone] 08/26/2013, 12:08 PM

## 2013-08-26 NOTE — Procedures (Signed)
Thoracentesis Procedure Note  Pre-operative Diagnosis: pleural effusion   Post-operative Diagnosis: same  Indications: diagnostics/therapeutic   Procedure Details  Consent: Informed consent was obtained. Risks of the procedure were discussed including: infection, bleeding, pain, pneumothorax.  Under sterile conditions the patient was positioned. Betadine solution and sterile drapes were utilized.  1% buffered lidocaine was used to anesthetize the pleural space which was identified via [place]. Fluid was obtained without any difficulties and minimal blood loss.  A dressing was applied to the wound and wound care instructions were provided.   Findings 1500 ml of bloody/ rust colored pleural fluid was obtained. A sample was sent to Pathology for cytogenetics, flow, and cell counts, as well as for infection analysis.  Complications:  None; patient tolerated the procedure well.          Condition: stable  Plan A follow up chest x-ray was ordered. Bed Rest for 0 hours. Tylenol 650 mg. for pain.  Attending Attestation: I was present and scrubbed for the entire procedure. Pleural fluid was noted as echo free space bounded by chest wall, diaphragm & atelectatic lung.  Point marked for thoracentesis. Post thora [place] did not show evidence of pneumothorax  Kara Mead MD. FCCP. [place] [facility] Critical [facility] Pager [phone] If no response call [phone]

## 2013-08-26 NOTE — Progress Notes (Signed)
CARE MANAGEMENT NOTE 08/26/2013  Patient:  Emma Douglas, Emma Douglas   Account Number:  [id number]  Date Initiated:  08/26/2013  Documentation initiated by:  DAVIS,RHONDA  Subjective/Objective Assessment:   history of extensive stage small cell lung cancer diagnosed in June of 2015, atrial flutter, hypothyroidism, hypertension, hyperlipidemia, colonic polyps, COPD, anemia, history of subdural hematoma in 2013 who presents [facility] the ED [facility] the [facility]     Action/Plan:   intubated due to progression of resp failure   Anticipated DC Date:  08/08/2013   Anticipated DC Plan:  [place]  In-house referral  NA      DC Planning Services  NA      [facility] Choice  NA   Choice offered to / List presented to:  NA   DME arranged  NA      DME agency  NA     [place] arranged  NA      [place] agency  NA   Status of service:  In process, will continue to follow Medicare Important Message given?  NA - LOS <3 / Initial given by admissions (If response is "NO", the following Medicare IM given date fields will be blank) Date Medicare IM given:  09-16-2013 Date Additional Medicare IM given:    Discharge Disposition:    Per UR Regulation:    If discussed at Long Length of Stay Meetings, dates discussed:    Comments:  [id number]/Rhonda Eldridge Dace, BSN, CCM [phone] Chart Reviewed for discharge and hospital needs. Discharge needs at time of review: None present will follow for needs. IM UPDATE NEEDED ON [id number] IF REMAINS INPATIENT Review of patient progress due on [id number].

## 2013-08-26 NOTE — Progress Notes (Signed)
Name: Emma Douglas MRN: [id number] DOB: 09/06/1940    ADMISSION DATE:  08/15/2013 CONSULTATION DATE:  08/21/2013  REFERRING MD :  Dr. Grandville Silos PRIMARY SERVICE: TRH  CHIEF COMPLAINT:  Septic Shock  BRIEF PATIENT DESCRIPTION:  73 year old female with extensive stage lung cancer and COPD presenting with syncope.  Patient in the ED was found to be hypotensive and CXR revealed large left sided white out.  PCCM was consulted for those reasons.  SIGNIFICANT EVENTS / STUDIES:  6/22 syncope  LINES / TUBES: L [place] TLC 6/22>>> 6/22 OETT>>>  CULTURES: Blood 6/22>>> Urine 6/22>>> Sputum 6/22>>>  ANTIBIOTICS: Vancomycin 6/22>>> Zosyn 6/22>>>   SUBJECTIVE:  Sedated on vent   VITAL SIGNS: Temp:  [95.5 F (35.3 C)-99 F (37.2 C)] 98.1 F (36.7 C) (06/23 0700) Pulse Rate:  [25-158] 110 (06/23 0407) Resp:  [14-35] 16 (06/23 0700) BP: (60-139)/(28-100) 84/63 mmHg (06/23 0700) SpO2:  [72 %-100 %] 96 % (06/23 0722) Arterial Line BP: (72-111)/(55-78) 79/56 mmHg (06/23 0700) FiO2 (%):  [40 %-100 %] 40 % (06/23 0722) Weight:  [45.36 kg (100 lb)-46.4 kg (102 lb 4.7 oz)] 46.4 kg (102 lb 4.7 oz) (06/22 2000) HEMODYNAMICS: CVP:  [10 mmHg] 10 mmHg VENTILATOR SETTINGS: Vent Mode:  [-] PRVC FiO2 (%):  [40 %-100 %] 40 % Set Rate:  [16 bmp] 16 bmp Vt Set:  [420 mL] 420 mL PEEP:  [5 cmH20] 5 cmH20 Plateau Pressure:  [23 cmH20-28 cmH20] 28 cmH20 INTAKE / OUTPUT: Intake/Output     06/22 0701 - 06/23 0700 06/23 0701 - 06/24 0700   I.V. (mL/kg) 8418.8 (181.4)    IV Piggyback 1550    Total Intake(mL/kg) 9968.8 (214.8)    Urine (mL/kg/hr) 775    Total Output 775     Net +9193.8            PHYSICAL EXAMINATION: General:  Chronically ill appearing elderly female. Neuro:  Sedated on vent  HEENT:  [place]/AT, PERRL, orally intubated  Cardiovascular:  RRR, Nl S1/S2, -M/R/G. Lungs:  Decreased on left, rhonchus on right  Abdomen:  Soft, NT, ND and +BS. Musculoskeletal:  -edema and  -tenderness. Skin:  Intact.  LABS:  CBC  Recent Labs Lab 08/09/2013 1350 08/21/2013 1404 08/18/2013 1913 08/26/13 0445  WBC 13.3*  --  14.6* 14.3*  HGB 10.6* 12.2 11.3* 10.6*  HCT 32.8* 36.0 35.2* 33.5*  PLT 513*  --  508* 464*   Coag's  Recent Labs Lab 08/05/2013 1913  APTT 32  INR 1.05   BMET  Recent Labs Lab 08/07/2013 1129  08/19/2013 1350 08/21/2013 1404 08/27/2013 1914 08/26/13 0445  NA 140  --  140 137  --  136*  K 4.3  --  4.2 4.0  --  4.3  CL  --   --  93* 97  --  100  CO2 25  --  23  --   --  20  BUN 89.0*  --  89* 97*  --  70*  CREATININE 4.2*  < > 4.02* 4.50* 3.26* 2.75*  GLUCOSE 135  --  119* 119*  --  189*  < > = values in this interval not displayed. Electrolytes  Recent Labs Lab 08/12/2013 1129 08/31/2013 1350 08/08/2013 1909 08/26/13 0445  CALCIUM 10.7* 9.6  --  8.0*  MG 2.6*  --  2.4  --   PHOS 7.1*  --   --   --    Sepsis Markers  Recent Labs Lab 08/27/2013 1404 08/07/2013  1909 08/26/13 0445  LATICACIDVEN 4.85*  --  3.0*  PROCALCITON  --  1.05 0.86   ABG  Recent Labs Lab 08/26/13 0040  PHART 7.302*  PCO2ART 41.4  PO2ART 350.0*   Liver Enzymes  Recent Labs Lab 08/11/2013 1129 08/14/2013 1350 08/26/13 0445  AST 34 35 51*  ALT 20 16 32  ALKPHOS 115 106 164*  BILITOT 0.26 <0.2* <0.2*  ALBUMIN 2.1* 2.1* 1.6*   Cardiac Enzymes  Recent Labs Lab 08/13/2013 1914 08/26/13 0050 08/26/13 0700  TROPONINI <0.30 <0.30 <0.30   Glucose  Recent Labs Lab 08/26/13 0737  GLUCAP 170*    Imaging Ct Head Wo Contrast  08/22/2013   CLINICAL DATA:  Syncope and fall.  EXAM: CT HEAD WITHOUT CONTRAST  TECHNIQUE: Contiguous axial images were obtained from the base of the skull through the vertex without intravenous contrast.  COMPARISON:  Brain MRI 08/08/2013  FINDINGS: There is no evidence of acute cortical infarct, intracranial hemorrhage, mass, or midline shift. 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 Patchy hypodensities in the  periventricular white matter are compatible with mild chronic small vessel ischemic disease. There is mild cerebral atrophy.  Orbits are unremarkable. Mastoid air cells are clear. Left maxillary sinus mucous retention cyst is partially visualized.  IMPRESSION: 1. No evidence of acute intracranial abnormality. 2. Unchanged, chronic small bifrontal subdural hematomas.   Electronically Signed   By: Logan Bores   On: 08/04/2013 14:42   US Renal Port  08/26/2013   CLINICAL DATA:  Acute renal failure.  EXAM: RENAL/URINARY TRACT ULTRASOUND COMPLETE  COMPARISON:  PET-CT 08/08/2013  FINDINGS: Right Kidney:  Length: 9.2 cm. Diffusely increased renal parenchymal echotexture consistent with medical renal disease. No significant parenchymal atrophy. No hydronephrosis.  Left Kidney:  Length: 10.1 cm. Diffusely increased parenchymal echotexture consistent with medical renal disease. No significant parenchymal atrophy. No hydronephrosis.  Bladder:  Bladder is not visualized due to decompression via Foley catheter.  Incidental note of free fluid in the abdomen suggesting ascites.  IMPRESSION: Echogenic renal parenchymal pattern suggesting chronic medical renal disease. No acute hydronephrosis. Abdominal ascites.   Electronically Signed   By: Lucienne Capers M.D.   On: 08/26/2013 06:01   Portable Chest Xray  08/08/2013   CLINICAL DATA:  Endotracheal tube placement.  EXAM: PORTABLE CHEST - 1 VIEW  COMPARISON:  08/09/2013  FINDINGS: Interval placement of endotracheal tube with tip measuring 3.9 cm above the carina. Previous left-sided central vascular catheter has been removed. Persistence of white out of the left hemi thorax without significant volume loss. Probable emphysematous changes and fibrosis in the right lung.  IMPRESSION: Endotracheal tube placed with tip measuring 3.9 cm above the carina. Persistent diffuse opacification of the left pneumothorax.   Electronically Signed   By: Lucienne Capers M.D.   On: 08/26/2013  22:55   Dg Chest Port 1 View  08/15/2013   CLINICAL DATA:  Evaluate central line placement.  EXAM: PORTABLE CHEST - 1 VIEW  COMPARISON:  Chest x-ray 08/18/2013  FINDINGS: New left subclavian central catheter, tip of which never crosses the midline and extends inferiorly parallel to the thoracic spine on the left side, concerning for arterial placement with tip likely in the thoracic aorta. The patient appears to be properly centered, making projectional distortion and misperception of this finding less likely. Complete opacification of the left hemithorax redemonstrated, which may reflect a massive left pleural effusion, extensive left lung atelectasis and/or extensive airspace consolidation in the left lung, similar to the prior  study. Irregular contour of the right hilum and right mediastinal border, compatible with lymphadenopathy as demonstrated by prior a chest CT 07/30/2013. There may be some slight shift of cardiomediastinal structures toward the right, which could relate to mass effect from the large left pleural fluid collection. No consolidative airspace disease in the right lung. No right pleural effusion. Heart and left mediastinal borders are obscured.  IMPRESSION: 1. Findings are concerning for potential arterial placement of new left subclavian catheter. Clinical correlation is recommended, as the tip of the catheter is favored to be within the thoracic aorta radiographically. 2. Otherwise, the appearance the chest is very similar to the recent prior study, however there may be some shift of cardiomediastinal structures toward the right side related to enlarging left-sided pleural fluid collection. These results were called by telephone at the time of interpretation on 08/31/2013 at 5:34 PM to Dr. Irine Seal , who verbally acknowledged these results.   Electronically Signed   By: Vinnie Langton M.D.   On: 09/01/2013 17:38   Dg Chest Portable 1 View  08/13/2013   CLINICAL DATA:  NEAR  SYNCOPE  EXAM: PORTABLE CHEST - 1 VIEW  COMPARISON:  Prior radiograph from 08/16/2013  FINDINGS: Cardiac and mediastinal silhouettes are largely obscured. Bulky right hilar adenopathy is grossly similar.  There is essentially complete opacification of the left hemi thorax, likely related to postobstructive consolidation from known left lower lobe lung mass. A component of pleural effusion may also be present.  Aerated right lung is grossly clear. Known right upper lobe mass is grossly stable. No pneumothorax.  No acute osseous abnormality.  Prominent degenerative changes noted about the shoulders bilaterally.  IMPRESSION: 1. Complete opacification of the left hemi thorax, likely related to in large part postobstructive consolidation from known left lower lobe lung mass. A component of pleural effusion may also be present. 2. Grossly stable bulky hilar adenopathy.   Electronically Signed   By: Jeannine Boga M.D.   On: 08/11/2013 14:28    CXR: L white out. No improvement   ASSESSMENT / PLAN:  PULMONARY A: Acute respiratory failure Lung cancer-->suspect cancer burden has progressed  Post-obstructive PNA large left sided effusion P:   - full vent support -bed side [place] left chest. Will need thora and f/u FOB.  -non-contrast CT chest after -f/u serial abg and CXR -see ID section  -call rad onc  CARDIOVASCULAR A: Septic shock. P:  -MAP >65 -CVP 8-12 -volume and pressors as indicated  -see ID section   RENAL A: AKI-->responding to resuscitation efforts  Mild hypernatremia  P:   -avoid hypotension -keep even volume status at this point -change MIVF to D51/2  GASTROINTESTINAL A: Severe malnutrition from end stage cancer.. P:   - Will need EOL discussion. - start TFs   HEMATOLOGIC A:  Leukocytosis P:  - Daily CBC. - Treat infection. - Will need H/O to see.  INFECTIOUS A:  L PNA. P:   - f/u Pan culture. - send culture of pleural fluid  - cont  Vanc/zosyn.  ENDOCRINE A:  Hyperglycemia  P:   ssi nml cortisol so will d/c stress dose steroids   NEUROLOGIC A: acute encephalopathy  P:   -RASS goal -2 -supportive care   TODAY'S SUMMARY:  Post obst PNA/effusion and septic shock. Will go ahead and tap left chest. She is DNR. We will aim to optimize pulmonary status  With thora & RT in effort to extubate. Hope we can progress to palliative XRT.  I have personally obtained a history, examined the patient, evaluated laboratory and imaging results, formulated the assessment and plan and placed orders. CRITICAL CARE: The patient is critically ill with multiple organ systems failure and requires high complexity decision making for assessment and support, frequent evaluation and titration of therapies, application of advanced monitoring technologies and extensive interpretation of multiple databases. Critical Care Time devoted to patient care services described in this note is 45 minutes.   Kara Mead MD. [place] Flood. [facility] Pager [phone] If no response call [phone]    08/26/2013, 8:40 AM

## 2013-08-26 NOTE — Progress Notes (Signed)
Leita Lindbloom   DOB:1940/10/12   IZ#:[id number]   PJA#:[id number]  Subjective:  Emma Douglas is a 73 year old woman with a history of extensive stage small cell lung cancer presented with left lower lobe lung mass in addition to mediastinal and right supraclavicular lymphadenopathy as well as hypermetabolic right upper lobe lung nodule in addition to liver and skeletal metastasis. She is currently evaluated for the enrollment in the clinical trial according to the ECoG E 2511 with the patient would be treated with cisplatin and etoposide plus/minus Veliparib.  She was last seen in office on 6/22, hypotensive and poorly responsive, confused. IV hydration was started and shr was transferred to the ED . She then was admitted by  [facility] service. CXR showed complete opacification of the left hemi thorax, likely related to in large part postobstructive consolidation from known left lower lobe lung mass and possible left large  pleural effusion. CT head which was done showed no evidence of acute intracranial abnormality. She was also on septic shock. IVF and IV antibiotics with Vanco Zosyn. Cultures pending. She is in renal failure. LDH was elevated at 2057. She is intubated and sedated. EOL issues are being entertained.     Scheduled Meds: . albuterol  2.5 mg Nebulization Q4H  . antiseptic oral rinse  15 mL Mouth Rinse QID  . aspirin EC  81 mg Oral Daily  . atorvastatin  20 mg Oral QHS  . chlorhexidine  15 mL Mouth Rinse BID  . feeding supplement (ENSURE COMPLETE)  237 mL Oral TID BM  . heparin  5,000 Units Subcutaneous 3 times per day  . hydrocortisone sod succinate (SOLU-CORTEF) inj  100 mg Intravenous Q6H  . ipratropium  0.5 mg Nebulization Q4H  . levothyroxine  100 mcg Oral QAC breakfast  . nicotine  14 mg Transdermal Daily  . pantoprazole  40 mg Oral Q0600  . piperacillin-tazobactam (ZOSYN)  IV  2.25 g Intravenous 4 times per day  . sodium chloride  3 mL Intravenous Q12H  . [START ON 08/27/2013]  vancomycin  500 mg Intravenous Q48H   Continuous Infusions: . sodium chloride 75 mL/hr at 08/04/2013 2100  . fentaNYL infusion INTRAVENOUS 75 mcg/hr (08/26/13 0600)  . phenylephrine (NEO-SYNEPHRINE) Adult infusion 200 mcg/min (08/26/13 0612)   PRN Meds:.acetaminophen, acetaminophen, albuterol, alum & mag hydroxide-simeth, docusate sodium, fentaNYL, gi cocktail, ipratropium, midazolam, midazolam, ondansetron (ZOFRAN) IV, ondansetron, polyethylene glycol, sodium chloride, sorbitol  Objective:  Filed Vitals:   08/26/13 0700  BP: 84/63  Pulse:   Temp: 98.1 F (36.7 C)  Resp: 16    Body mass index is 16.52 kg/(m^2).  Intake/Output Summary (Last 24 hours) at 08/26/13 0859 Last data filed at 08/26/13 0600  Gross per 24 hour  Intake 9968.79 ml  Output    775 ml  Net 9193.79 ml    ECOG PERFORMANCE STATUS:  4 Bedbound  GENERAL intubated, sedated. Ill appearing, frail.  SKIN: skin color, texture, turgor are normal, no rashes or significant lesions NECK: supple, thyroid normal size, non-tender, without nodularity LYMPH:  no palpable lymphadenopathy in the cervical, axillary or inguinal LUNGS: Right lung with rhonchi, no wheezes or rales. Decreased breath sounds on the left.  HEART: irregular rate & rhythm and no murmurs and no lower extremity edema ABDOMEN:abdomen soft, non-tender and decreased bowel sounds       CBG (last 3)   Recent Labs  08/26/13 0737  GLUCAP 170*     Labs:   Recent Labs Lab 08/19/2013 1129 08/24/2013 1350  09/01/2013 1404 08/13/2013 1913 08/26/13 0445  WBC 15.0* 13.3*  --  14.6* 14.3*  HGB 11.5* 10.6* 12.2 11.3* 10.6*  HCT 36.0 32.8* 36.0 35.2* 33.5*  PLT 465* 513*  --  508* 464*  MCV 98.1 96.8  --  96.7 97.7  MCH 31.3 31.3  --  31.0 30.9  MCHC 31.9 32.3  --  32.1 31.6  RDW 14.5 14.3  --  14.4 14.5  LYMPHSABS 1.5 0.5*  --   --   --   MONOABS 1.4* 1.0  --   --   --   EOSABS 0.0 0.0  --   --   --   BASOSABS 0.0 0.0  --   --   --       Chemistries:    Recent Labs Lab 08/31/2013 1129 08/06/2013 1350 08/08/2013 1404 08/08/2013 1909 08/24/2013 1914 08/26/13 0445  NA 140 140 137  --   --  136*  K 4.3 4.2 4.0  --   --  4.3  CL  --  93* 97  --   --  100  CO2 25 23  --   --   --  20  GLUCOSE 135 119* 119*  --   --  189*  BUN 89.0* 89* 97*  --   --  70*  CREATININE 4.2* 4.02* 4.50*  --  3.26* 2.75*  CALCIUM 10.7* 9.6  --   --   --  8.0*  MG 2.6*  --   --  2.4  --   --   AST 34 35  --   --   --  51*  ALT 20 16  --   --   --  32  ALKPHOS 115 106  --   --   --  164*  BILITOT 0.26 <0.2*  --   --   --  <0.2*    GFR Estimated Creatinine Clearance: 13.1 ml/min (by C-G formula based on Cr of 2.75).  Liver Function Tests:  Recent Labs Lab 08/18/2013 1129 08/17/2013 1350 08/26/13 0445  AST 34 35 51*  ALT 20 16 32  ALKPHOS 115 106 164*  BILITOT 0.26 <0.2* <0.2*  PROT 7.4 6.8 5.6*  ALBUMIN 2.1* 2.1* 1.6*    Urine Studies     Component Value Date/Time   COLORURINE YELLOW 08/17/2013 1446   APPEARANCEUR CLOUDY* 08/05/2013 1446   LABSPEC 1.015 08/18/2013 1446   PHURINE 5.0 08/13/2013 1446   GLUCOSEU NEGATIVE 08/05/2013 1446   HGBUR MODERATE* 08/20/2013 1446   BILIRUBINUR NEGATIVE 08/04/2013 1446   KETONESUR NEGATIVE 08/15/2013 1446   PROTEINUR NEGATIVE 08/11/2013 1446   UROBILINOGEN 0.2 08/22/2013 1446   NITRITE NEGATIVE 08/23/2013 1446   LEUKOCYTESUR NEGATIVE 08/15/2013 1446    Coagulation profile  Recent Labs Lab 08/06/2013 1913  INR 1.05    Cardiac Enzymes:  Recent Labs Lab 08/26/2013 1914 08/26/13 0050 08/26/13 0700  TROPONINI <0.30 <0.30 <0.30      Recent Labs Lab 08/26/13 0737  GLUCAP 170*   D-Dimer  Recent Labs  08/22/2013 1350  DDIMER 11.29*   Thyroid function studies  Recent Labs  08/24/2013 1612  TSH 0.705   Microbiology Recent Results (from the past 240 hour(s))  MRSA PCR SCREENING     Status: None   Collection Time    08/23/2013  5:20 PM      Result Value Ref Range Status   MRSA by  PCR NEGATIVE  NEGATIVE Final   Comment:  The GeneXpert MRSA Assay (FDA     approved for NASAL specimens     only), is one component of a     comprehensive MRSA colonization     surveillance program. It is not     intended to diagnose MRSA     infection nor to guide or     monitor treatment for     MRSA infections.       Imaging Studies:  Ct Head Wo Contrast  08/21/2013   CLINICAL DATA:  Syncope and fall.  EXAM: CT HEAD WITHOUT CONTRAST  TECHNIQUE: Contiguous axial images were obtained from the base of the skull through the vertex without intravenous contrast.  COMPARISON:  Brain MRI 08/08/2013  FINDINGS: There is no evidence of acute cortical infarct, intracranial hemorrhage, mass, or midline shift. 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 Patchy hypodensities in the periventricular white matter are compatible with mild chronic small vessel ischemic disease. There is mild cerebral atrophy.  Orbits are unremarkable. Mastoid air cells are clear. Left maxillary sinus mucous retention cyst is partially visualized.  IMPRESSION: 1. No evidence of acute intracranial abnormality. 2. Unchanged, chronic small bifrontal subdural hematomas.   Electronically Signed   By: Logan Bores   On: 08/09/2013 14:42   US Renal Port  08/26/2013   CLINICAL DATA:  Acute renal failure.  EXAM: RENAL/URINARY TRACT ULTRASOUND COMPLETE  COMPARISON:  PET-CT 08/08/2013  FINDINGS: Right Kidney:  Length: 9.2 cm. Diffusely increased renal parenchymal echotexture consistent with medical renal disease. No significant parenchymal atrophy. No hydronephrosis.  Left Kidney:  Length: 10.1 cm. Diffusely increased parenchymal echotexture consistent with medical renal disease. No significant parenchymal atrophy. No hydronephrosis.  Bladder:  Bladder is not visualized due to decompression via Foley catheter.  Incidental note of free fluid in the abdomen suggesting ascites.  IMPRESSION: Echogenic renal parenchymal  pattern suggesting chronic medical renal disease. No acute hydronephrosis. Abdominal ascites.   Electronically Signed   By: Lucienne Capers M.D.   On: 08/26/2013 06:01   Portable Chest Xray  08/31/2013   CLINICAL DATA:  Endotracheal tube placement.  EXAM: PORTABLE CHEST - 1 VIEW  COMPARISON:  08/26/2013  FINDINGS: Interval placement of endotracheal tube with tip measuring 3.9 cm above the carina. Previous left-sided central vascular catheter has been removed. Persistence of white out of the left hemi thorax without significant volume loss. Probable emphysematous changes and fibrosis in the right lung.  IMPRESSION: Endotracheal tube placed with tip measuring 3.9 cm above the carina. Persistent diffuse opacification of the left pneumothorax.   Electronically Signed   By: Lucienne Capers M.D.   On: 08/24/2013 22:55   Dg Chest Port 1 View  09/02/2013   CLINICAL DATA:  Evaluate central line placement.  EXAM: PORTABLE CHEST - 1 VIEW  COMPARISON:  Chest x-ray 08/27/2013  FINDINGS: New left subclavian central catheter, tip of which never crosses the midline and extends inferiorly parallel to the thoracic spine on the left side, concerning for arterial placement with tip likely in the thoracic aorta. The patient appears to be properly centered, making projectional distortion and misperception of this finding less likely. Complete opacification of the left hemithorax redemonstrated, which may reflect a massive left pleural effusion, extensive left lung atelectasis and/or extensive airspace consolidation in the left lung, similar to the prior study. Irregular contour of the right hilum and right mediastinal border, compatible with lymphadenopathy as demonstrated by prior a chest CT 07/30/2013. There may be some slight shift of cardiomediastinal structures toward  the right, which could relate to mass effect from the large left pleural fluid collection. No consolidative airspace disease in the right lung. No right  pleural effusion. Heart and left mediastinal borders are obscured.  IMPRESSION: 1. Findings are concerning for potential arterial placement of new left subclavian catheter. Clinical correlation is recommended, as the tip of the catheter is favored to be within the thoracic aorta radiographically. 2. Otherwise, the appearance the chest is very similar to the recent prior study, however there may be some shift of cardiomediastinal structures toward the right side related to enlarging left-sided pleural fluid collection. These results were called by telephone at the time of interpretation on 08/10/2013 at 5:34 PM to Dr. Irine Seal , who verbally acknowledged these results.   Electronically Signed   By: Vinnie Langton M.D.   On: 08/08/2013 17:38   Dg Chest Portable 1 View  08/23/2013   CLINICAL DATA:  NEAR SYNCOPE  EXAM: PORTABLE CHEST - 1 VIEW  COMPARISON:  Prior radiograph from 08/16/2013  FINDINGS: Cardiac and mediastinal silhouettes are largely obscured. Bulky right hilar adenopathy is grossly similar.  There is essentially complete opacification of the left hemi thorax, likely related to postobstructive consolidation from known left lower lobe lung mass. A component of pleural effusion may also be present.  Aerated right lung is grossly clear. Known right upper lobe mass is grossly stable. No pneumothorax.  No acute osseous abnormality.  Prominent degenerative changes noted about the shoulders bilaterally.  IMPRESSION: 1. Complete opacification of the left hemi thorax, likely related to in large part postobstructive consolidation from known left lower lobe lung mass. A component of pleural effusion may also be present. 2. Grossly stable bulky hilar adenopathy.   Electronically Signed   By: Jeannine Boga M.D.   On: 08/05/2013 14:28    Assessment/Plan: 73 y.o.  1. extensive stage small cell lung cancer 2. Left lung Collapse/ effusion 3. Post obstructive pneumonia She is currently evaluated for  the enrollment in the clinical trial according to the ECoG E 2511 with the patient would be treated with cisplatin and etoposide plus/minus Veliparib.  Due to the current events leading to this admission, this plans are on hold CT of the chest pending For left thoracentesis.   2. Syncope and Collapse Due to hypotension. CT head negative.  CCM involved, administering pressors and IVF  3. Hypotension 4 Acute renal failure 5. Possible SIRS Workup in progress to determine etiology. Appreciate CCM/ TH involvement  6. Anemia Multifactorial  No bleeding issues noted. No transfusion indicated at this time  7. Leukocytosis Secondary to infection, inflammation Monitor.  8. Positive D Dimer CTA unable to be performed due to current status Dopplers pending  9. Diarrhea C Diff culture pending On contact precautions   10. Partial Code   Other medical issues as per admitting team     **Disclaimer: This note was dictated with voice recognition software. Similar sounding words can inadvertently be transcribed and this note may contain transcription errors which may not have been corrected upon publication of note.** Rondel Jumbo, PA-C 08/26/2013  8:59 AM  ADDENDUM: Hematology/Oncology Attending: The patient is seen and examined. I agree with the above note. She is a very pleasant 73 years old [place] American female with recently diagnosed with extensive stage small cell lung cancer and was considered for treatment according to the clinical trial ECoG E 2511. Unfortunately the patient came to the clinic yesterday and was unresponsive secondary to severe dehydration and worsening renal  function in addition to worsening shortness of breath and respiratory failure. She is currently intubated for respiratory failure. Her daughter and granddaughter were at the bedside. I will consult radiation oncology for consideration of short course of palliative radiotherapy to the obstructive lung  mass. Once the patient has improvement in her condition, I would consider her for systemic chemotherapy with reduced dose carboplatin and etoposide. Continue current treatment for postobstructive pneumonia with vancomycin and Zosyn. Thank you so much for taking good care of Emma Douglas, I will continue to follow up the patient with you and assist in her management on as-needed basis.

## 2013-08-26 NOTE — Progress Notes (Signed)
PT Cancellation Note  Patient Details Name: Emma Douglas MRN: [id number] DOB: Feb 11, 1941   Cancelled Treatment:     PT deferred - RN advises BP low and pt currently sedated and unable to participate.  Will follow.   Lewanda Perea 08/26/2013, 8:49 AM

## 2013-08-26 NOTE — Procedures (Signed)
Arterial Catheter Insertion Procedure Note Shauntea Lok [id number] December 26, 1940  Procedure: Insertion of Arterial Catheter  Indications: Blood pressure monitoring  Procedure Details Consent: Unable to obtain consent because of emergent medical necessity. Time Out: Verified patient identification, verified procedure, site/side was marked, verified correct patient position, special equipment/implants available, medications/allergies/relevent history reviewed, required imaging and test results available.  Performed  Maximum sterile technique was used including antiseptics, cap, gloves, gown, hand hygiene, mask and sheet. Skin prep: Chlorhexidine; local anesthetic administered 20 gauge catheter was inserted into left radial artery using the Seldinger technique.  Evaluation Blood flow good; BP tracing good. Complications: No apparent complications.   Philomena Doheny 08/26/2013

## 2013-08-26 NOTE — Progress Notes (Signed)
*  PRELIMINARY RESULTS* Vascular Ultrasound Lower extremity venous duplex has been completed.  Preliminary findings: Technically limited due to immobility. No evidence of DVT in visualized veins.  Landry Mellow, RDMS, RVT  08/26/2013, 2:04 PM

## 2013-08-27 ENCOUNTER — Inpatient Hospital Stay (HOSPITAL_COMMUNITY): Payer: Medicare Other

## 2013-08-27 ENCOUNTER — Ambulatory Visit: Payer: Medicare Other

## 2013-08-27 LAB — COMPREHENSIVE METABOLIC PANEL
ALBUMIN: 1.5 g/dL — AB (ref 3.5–5.2)
ALT: 80 U/L — ABNORMAL HIGH (ref 0–35)
AST: 73 U/L — AB (ref 0–37)
Alkaline Phosphatase: 122 U/L — ABNORMAL HIGH (ref 39–117)
BUN: 56 mg/dL — ABNORMAL HIGH (ref 6–23)
CALCIUM: 8.2 mg/dL — AB (ref 8.4–10.5)
CO2: 20 mEq/L (ref 19–32)
Chloride: 101 mEq/L (ref 96–112)
Creatinine, Ser: 2 mg/dL — ABNORMAL HIGH (ref 0.50–1.10)
GFR calc Af Amer: 27 mL/min — ABNORMAL LOW (ref >90)
GFR, EST NON AFRICAN AMERICAN: 23 mL/min — AB (ref >90)
Glucose, Bld: 171 mg/dL — ABNORMAL HIGH (ref 70–99)
Potassium: 3.5 mEq/L — ABNORMAL LOW (ref 3.7–5.3)
SODIUM: 136 meq/L — AB (ref 137–147)
Total Bilirubin: <0.2 mg/dL — ABNORMAL LOW (ref 0.3–1.2)
Total Protein: 5.6 g/dL — ABNORMAL LOW (ref 6.0–8.3)

## 2013-08-27 LAB — BLOOD GAS, ARTERIAL
Acid-base deficit: 7.6 mmol/L — ABNORMAL HIGH (ref 0.0–2.0)
Acid-base deficit: 11.1 mmol/L — ABNORMAL HIGH (ref 0.0–2.0)
Bicarbonate: 19 mEq/L — ABNORMAL LOW (ref 20.0–24.0)
Bicarbonate: 17.5 mEq/L — ABNORMAL LOW (ref 20.0–24.0)
DRAWN BY: 308601
DRAWN BY: 295031
FIO2: 0.35 %
FIO2: 0.4 %
LHR: 16 {breaths}/min
LHR: 16 {breaths}/min
MECHVT: 420 mL
MECHVT: 420 mL
O2 SAT: 93.2 %
O2 Saturation: 93.4 %
PATIENT TEMPERATURE: 98.6
PEEP/CPAP: 5 cmH2O
PEEP/CPAP: 5 cmH2O
PH ART: 7.244 — AB (ref 7.350–7.450)
Patient temperature: 98.6
TCO2: 18.1 mmol/L (ref 0–100)
TCO2: 16.1 mmol/L (ref 0–100)
pCO2 arterial: 45.5 mmHg — ABNORMAL HIGH (ref 35.0–45.0)
pCO2 arterial: 49.6 mmHg — ABNORMAL HIGH (ref 35.0–45.0)
pH, Arterial: 7.173 — CL (ref 7.350–7.450)
pO2, Arterial: 76.9 mmHg — ABNORMAL LOW (ref 80.0–100.0)
pO2, Arterial: 82.5 mmHg (ref 80.0–100.0)

## 2013-08-27 LAB — CBC
HCT: 34.4 % — ABNORMAL LOW (ref 36.0–46.0)
HEMATOCRIT: 32.8 % — AB (ref 36.0–46.0)
HEMOGLOBIN: 10.4 g/dL — AB (ref 12.0–15.0)
Hemoglobin: 10.8 g/dL — ABNORMAL LOW (ref 12.0–15.0)
MCH: 30.5 pg (ref 26.0–34.0)
MCH: 31 pg (ref 26.0–34.0)
MCHC: 31.4 g/dL (ref 30.0–36.0)
MCHC: 31.7 g/dL (ref 30.0–36.0)
MCV: 97.2 fL (ref 78.0–100.0)
MCV: 97.6 fL (ref 78.0–100.0)
PLATELETS: 433 10*3/uL — AB (ref 150–400)
Platelets: 373 10*3/uL (ref 150–400)
RBC: 3.54 MIL/uL — AB (ref 3.87–5.11)
RBC: 3.36 MIL/uL — AB (ref 3.87–5.11)
RDW: 14.3 % (ref 11.5–15.5)
RDW: 14.3 % (ref 11.5–15.5)
WBC: 17 10*3/uL — AB (ref 4.0–10.5)
WBC: 14 10*3/uL — AB (ref 4.0–10.5)

## 2013-08-27 LAB — BASIC METABOLIC PANEL
BUN: 51 mg/dL — ABNORMAL HIGH (ref 6–23)
CALCIUM: 8 mg/dL — AB (ref 8.4–10.5)
CO2: 18 mEq/L — ABNORMAL LOW (ref 19–32)
Chloride: 98 mEq/L (ref 96–112)
Creatinine, Ser: 2.03 mg/dL — ABNORMAL HIGH (ref 0.50–1.10)
GFR calc Af Amer: 27 mL/min — ABNORMAL LOW (ref >90)
GFR, EST NON AFRICAN AMERICAN: 23 mL/min — AB (ref >90)
GLUCOSE: 144 mg/dL — AB (ref 70–99)
POTASSIUM: 4.6 meq/L (ref 3.7–5.3)
Sodium: 132 mEq/L — ABNORMAL LOW (ref 137–147)

## 2013-08-27 LAB — HEPATIC FUNCTION PANEL
ALBUMIN: 1.5 g/dL — AB (ref 3.5–5.2)
ALT: 72 U/L — ABNORMAL HIGH (ref 0–35)
AST: 61 U/L — AB (ref 0–37)
Alkaline Phosphatase: 111 U/L (ref 39–117)
Bilirubin, Direct: <0.2 mg/dL (ref 0.0–0.3)
TOTAL PROTEIN: 5.3 g/dL — AB (ref 6.0–8.3)
Total Bilirubin: <0.2 mg/dL — ABNORMAL LOW (ref 0.3–1.2)

## 2013-08-27 LAB — PROTIME-INR
INR: 1.17 (ref 0.00–1.49)
Prothrombin Time: 14.9 seconds (ref 11.6–15.2)

## 2013-08-27 LAB — LACTIC ACID, PLASMA: Lactic Acid, Venous: 1.3 mmol/L (ref 0.5–2.2)

## 2013-08-27 LAB — TROPONIN I

## 2013-08-27 LAB — GLUCOSE, CAPILLARY: GLUCOSE-CAPILLARY: 195 mg/dL — AB (ref 70–99)

## 2013-08-27 LAB — PROCALCITONIN: Procalcitonin: 0.3 ng/mL

## 2013-08-27 MED ORDER — SODIUM CHLORIDE 0.9 % IV SOLN
500.0000 mg | INTRAVENOUS | Status: DC
  Administered 2013-08-27: 500 mg via INTRAVENOUS
  Filled 2013-08-27 (×2): qty 500

## 2013-08-27 MED ORDER — PIPERACILLIN-TAZOBACTAM 3.375 G IVPB
3.3750 g | Freq: Three times a day (TID) | INTRAVENOUS | Status: DC
  Administered 2013-08-27 – 2013-08-28 (×3): 3.375 g via INTRAVENOUS
  Filled 2013-08-27 (×3): qty 50

## 2013-08-27 MED ORDER — VASOPRESSIN 20 UNIT/ML IJ SOLN
0.0300 [IU]/min | INTRAVENOUS | Status: DC
  Administered 2013-08-27: 0.03 [IU]/min via INTRAVENOUS
  Filled 2013-08-27: qty 2.5

## 2013-08-27 MED ORDER — IPRATROPIUM-ALBUTEROL 0.5-2.5 (3) MG/3ML IN SOLN
3.0000 mL | RESPIRATORY_TRACT | Status: DC
  Administered 2013-08-27 – 2013-08-28 (×5): 3 mL via RESPIRATORY_TRACT
  Filled 2013-08-27 (×5): qty 3

## 2013-08-27 MED ORDER — FENTANYL CITRATE 0.05 MG/ML IJ SOLN: 50.0000 ug | INTRAMUSCULAR | Status: DC | PRN

## 2013-08-27 MED ORDER — DEXTROSE 5 % IV SOLN
INTRAVENOUS | Status: DC
  Administered 2013-08-27 – 2013-08-28 (×2): via INTRAVENOUS
  Filled 2013-08-27 (×4): qty 150

## 2013-08-27 MED ORDER — DEXMEDETOMIDINE HCL IN NACL 200 MCG/50ML IV SOLN
0.0000 ug/kg/h | INTRAVENOUS | Status: DC
  Administered 2013-08-27: 0.2 ug/kg/h via INTRAVENOUS
  Administered 2013-08-27: 0.7 ug/kg/h via INTRAVENOUS
  Filled 2013-08-27 (×2): qty 50

## 2013-08-27 MED ORDER — MIDAZOLAM HCL 2 MG/2ML IJ SOLN
1.0000 mg | INTRAMUSCULAR | Status: DC | PRN
  Administered 2013-08-27 (×6): 1 mg via INTRAVENOUS
  Administered 2013-08-27: 0.5 mg via INTRAVENOUS
  Administered 2013-08-28 (×4): 1 mg via INTRAVENOUS
  Filled 2013-08-27 (×10): qty 2

## 2013-08-27 MED ORDER — METOPROLOL TARTRATE 1 MG/ML IV SOLN
2.5000 mg | INTRAVENOUS | Status: DC | PRN
  Administered 2013-08-27: 2.5 mg via INTRAVENOUS
  Filled 2013-08-27: qty 5

## 2013-08-27 MED ORDER — POTASSIUM CHLORIDE 20 MEQ/15ML (10%) PO LIQD
40.0000 meq | Freq: Once | ORAL | Status: AC
  Administered 2013-08-27: 40 meq
  Filled 2013-08-27: qty 30

## 2013-08-27 MED ORDER — AMIODARONE IV BOLUS ONLY 150 MG/100ML
150.0000 mg | Freq: Once | INTRAVENOUS | Status: AC
  Administered 2013-08-27: 150 mg via INTRAVENOUS
  Filled 2013-08-27: qty 100

## 2013-08-27 MED ORDER — MIDAZOLAM HCL 2 MG/2ML IJ SOLN
1.0000 mg | INTRAMUSCULAR | Status: DC | PRN
  Filled 2013-08-27 (×3): qty 2

## 2013-08-27 MED ORDER — SODIUM CHLORIDE 0.9 % IV BOLUS (SEPSIS)
1000.0000 mL | Freq: Once | INTRAVENOUS | Status: AC
  Administered 2013-08-27: 1000 mL via INTRAVENOUS

## 2013-08-27 MED ORDER — DEXMEDETOMIDINE HCL IN NACL 400 MCG/100ML IV SOLN
0.0000 ug/kg/h | INTRAVENOUS | Status: DC
  Administered 2013-08-27: 1.2 ug/kg/h via INTRAVENOUS
  Administered 2013-08-27: 1 ug/kg/h via INTRAVENOUS
  Administered 2013-08-28: 1.2 ug/kg/h via INTRAVENOUS
  Filled 2013-08-27 (×4): qty 100

## 2013-08-27 NOTE — Progress Notes (Signed)
Pt continues to be periodically very agitated - thrashing head and body, and pulling at her ETT if able.  Pt is on Fentanyl which calms her minimally.  Versed helps calm her but decreases blood pressure.  Dr Halford Chessman made aware of this situation and received orders for Precedex.  Will continue to assess.

## 2013-08-27 NOTE — Progress Notes (Signed)
  Recent Labs Lab 08/26/2013 1404 08/26/13 0040 08/27/13 0450 08/27/13 1747  PHART  --  7.302* 7.244* 7.173*  PCO2ART  --  41.4 45.5* 49.6*  PO2ART  --  350.0* 76.9* 82.5  HCO3  --  19.9* 19.0* 17.5*  TCO2 23 18.6 18.1 16.1  O2SAT  --  99.5 93.4 93.2     Worsening acidosis  Plan Start bic gtt   Dr. Brand Males, M.D., F.C.C.P Pulmonary and Critical Care Medicine Staff Physician [facility] Pager: [phone], If no answer or between  15:00h - 7:00h: call [phone]  08/27/2013 7:51 PM

## 2013-08-27 NOTE — Progress Notes (Signed)
Physical Therapy Discharge Patient Details Name: Emma Douglas MRN: [id number] DOB: [birth year] Today's Date: 08/27/2013 Time:  -     Patient discharged from PT services secondary to medical decline - will need to re-order PT to resume therapy services.  .     GP     Marcelino Freestone PT [phone]  08/27/2013, 7:07 AM

## 2013-08-27 NOTE — Progress Notes (Signed)
ANTIBIOTIC CONSULT NOTE - FOLLOW UP  Pharmacy Consult for Vancomycin and Zosyn Indication: Sepsis, PNA  Allergies  Allergen Reactions  . Black Pepper [[place]] Anaphylaxis  . Tomato Anaphylaxis    Has epi pen    Patient Measurements: Height: 5\' 6"  (167.6 cm) Weight: 115 lb 11.9 oz (52.5 kg) IBW/kg (Calculated) : 59.3  Vital Signs: Temp: 98.6 F (37 C) (06/24 0600) BP: 90/47 mmHg (06/24 0600) Pulse Rate: 57 (06/24 0600) Intake/Output from previous day: 06/23 0701 - 06/24 0700 In: 5316.8 [I.V.:3646.8; NG/GT:470; IV Piggyback:1200] Out: 1605 [Urine:1605] Intake/Output from this shift:    Labs:  Recent Labs  08/20/2013 1852 08/20/2013 1913 08/31/2013 1914 08/26/13 0445 08/27/13 0530  WBC  --  14.6*  --  14.3* 17.0*  HGB  --  11.3*  --  10.6* 10.8*  PLT  --  508*  --  464* 433*  LABCREA 82.1  --   --   --   --   CREATININE  --   --  3.26* 2.75* 2.00*   Estimated Creatinine Clearance: 20.5 ml/min (by C-G formula based on Cr of 2). 30 ml/min/1.22m2 (normalized)  No results found for this basename: VANCOTROUGH, VANCOPEAK, VANCORANDOM, [place], GENTPEAK, GENTRANDOM, TOBRATROUGH, TOBRAPEAK, TOBRARND, AMIKACINPEAK, AMIKACINTROU, AMIKACIN,  in the last 72 hours   Microbiology: Recent Results (from the past 720 hour(s))  CULTURE, BLOOD (ROUTINE X 2)     Status: None   Collection Time    07/30/13  7:24 PM      Result Value Ref Range Status   Specimen Description BLOOD RIGHT FOREARM   Final   Special Requests BOTTLES DRAWN AEROBIC ONLY 5CC   Final   Culture  Setup Time     Final   Value: 07/31/2013 00:43     Performed at [facility]   Culture     Final   Value: NO GROWTH 5 DAYS     Performed at [facility]   Report Status 08/06/2013 FINAL   Final  CULTURE, BLOOD (ROUTINE X 2)     Status: None   Collection Time    07/30/13  7:34 PM      Result Value Ref Range Status   Specimen Description BLOOD RIGHT FOREARM   Final   Special Requests BOTTLES DRAWN  AEROBIC AND ANAEROBIC 5CC   Final   Culture  Setup Time     Final   Value: 07/31/2013 00:43     Performed at [facility]   Culture     Final   Value: NO GROWTH 5 DAYS     Performed at [facility]   Report Status 08/06/2013 FINAL   Final  MRSA PCR SCREENING     Status: None   Collection Time    07/30/13  9:59 PM      Result Value Ref Range Status   MRSA by PCR NEGATIVE  NEGATIVE Final   Comment:            The GeneXpert MRSA Assay (FDA     approved for NASAL specimens     only), is one component of a     comprehensive MRSA colonization     surveillance program. It is not     intended to diagnose MRSA     infection nor to guide or     monitor treatment for     MRSA infections.  CULTURE, BLOOD (ROUTINE X 2)     Status: None   Collection Time  07/30/13 10:00 PM      Result Value Ref Range Status   Specimen Description BLOOD RIGHT HAND   Final   Special Requests BOTTLES DRAWN AEROBIC ONLY 5CC   Final   Culture  Setup Time     Final   Value: 07/31/2013 03:54     Performed at [facility]   Culture     Final   Value: NO GROWTH 5 DAYS     Performed at [facility]   Report Status 08/06/2013 FINAL   Final  CULTURE, BLOOD (ROUTINE X 2)     Status: None   Collection Time    07/30/13 10:09 PM      Result Value Ref Range Status   Specimen Description BLOOD LEFT HAND   Final   Special Requests BOTTLES DRAWN AEROBIC ONLY 1.5CC   Final   Culture  Setup Time     Final   Value: 07/31/2013 03:54     Performed at [facility]   Culture     Final   Value: NO GROWTH 5 DAYS     Performed at [facility]   Report Status 08/06/2013 FINAL   Final  CULTURE, EXPECTORATED SPUTUM-ASSESSMENT     Status: None   Collection Time    07/31/13  9:06 AM      Result Value Ref Range Status   Specimen Description SPUTUM   Final   Special Requests NONE   Final   Sputum evaluation     Final   Value: MICROSCOPIC FINDINGS SUGGEST THAT THIS SPECIMEN IS  NOT REPRESENTATIVE OF LOWER RESPIRATORY SECRETIONS. PLEASE RECOLLECT.     Gram Stain Report Called to,Read Back By and Verified With: Darryll Capers RN 11:45 07/31/13 ([email or web address])   Report Status 07/31/2013 FINAL   Final  CULTURE, BLOOD (ROUTINE X 2)     Status: None   Collection Time    08/16/13  7:05 AM      Result Value Ref Range Status   Specimen Description BLOOD RIGHT ARM   Final   Special Requests BOTTLES DRAWN AEROBIC ONLY 5CC   Final   Culture  Setup Time     Final   Value: 08/16/2013 15:48     Performed at [facility]   Culture     Final   Value: NO GROWTH 5 DAYS     Performed at [facility]   Report Status 08/22/2013 FINAL   Final  CULTURE, BLOOD (ROUTINE X 2)     Status: None   Collection Time    08/16/13  7:20 AM      Result Value Ref Range Status   Specimen Description BLOOD RIGHT ARM   Final   Special Requests BOTTLES DRAWN AEROBIC ONLY 2CC   Final   Culture  Setup Time     Final   Value: 08/16/2013 15:48     Performed at [facility]   Culture     Final   Value: NO GROWTH 5 DAYS     Performed at [facility]   Report Status 08/22/2013 FINAL   Final  CULTURE, BLOOD (ROUTINE X 2)     Status: None   Collection Time    08/07/2013  1:40 PM      Result Value Ref Range Status   Specimen Description BLOOD RIGHT HAND   Final   Special Requests BOTTLES DRAWN AEROBIC AND ANAEROBIC 10CC   Final   Culture  Setup Time  Final   Value: 08/15/2013 16:27     Performed at [facility]   Culture     Final   Value:        BLOOD CULTURE RECEIVED NO GROWTH TO DATE CULTURE WILL BE HELD FOR 5 DAYS BEFORE ISSUING A FINAL NEGATIVE REPORT     Performed at [facility]   Report Status PENDING   Incomplete  CULTURE, BLOOD (ROUTINE X 2)     Status: None   Collection Time    08/07/2013  1:50 PM      Result Value Ref Range Status   Specimen Description BLOOD RIGHT ARM   Final   Special Requests BOTTLES DRAWN AEROBIC AND ANAEROBIC 10CC   Final    Culture  Setup Time     Final   Value: 08/20/2013 16:27     Performed at [facility]   Culture     Final   Value:        BLOOD CULTURE RECEIVED NO GROWTH TO DATE CULTURE WILL BE HELD FOR 5 DAYS BEFORE ISSUING A FINAL NEGATIVE REPORT     Performed at [facility]   Report Status PENDING   Incomplete  URINE CULTURE     Status: None   Collection Time    08/06/2013  2:46 PM      Result Value Ref Range Status   Specimen Description URINE, CLEAN CATCH   Final   Special Requests NONE   Final   Culture  Setup Time     Final   Value: 08/27/2013 22:38     Performed at [facility]     Final   Value: 70,000 COLONIES/ML     Performed at [facility]   Culture     Final   Value: Multiple bacterial morphotypes present, none predominant. Suggest appropriate recollection if clinically indicated.     Performed at [facility]   Report Status 08/26/2013 FINAL   Final  MRSA PCR SCREENING     Status: None   Collection Time    09/02/2013  5:20 PM      Result Value Ref Range Status   MRSA by PCR NEGATIVE  NEGATIVE Final   Comment:            The GeneXpert MRSA Assay (FDA     approved for NASAL specimens     only), is one component of a     comprehensive MRSA colonization     surveillance program. It is not     intended to diagnose MRSA     infection nor to guide or     monitor treatment for     MRSA infections.  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 Started broad spectrum abx for Sepsis/PNA. Thoracentesis performed 6/23, fluid analysis and cultures pending.  6/24: Day #3 Vanc/Zosyn. SCr now improving so will adjust antibiotic doses. Remains afebrile and cultures negative to date.  Labs / Vitals: Temp: 95.5 > 98.6 WBC: elevated and rising Renal: ARF (SCr wnl 6/16), SCr down 2.0, CrCl 20CG, 28N Lactate: 4.85 > 3 PCT: 1.05 > 0.86 > 0.3  Microbiology: 6/22 urine (14:46): 45k multiple sp (F) 6/22 urine (18:52): NGF 6/22 blood x2: ngtd 6/22 urine strep/legionella: neg 6/22 Cdiff: 6/23 pleural fluid:   Goal of Therapy:  Vancomycin trough level 15-20 mcg/ml Zosyn dose per renal function  Plan:   Increase Vancomycin to 500mg  IV q24h  Adjust Zosyn 3.375gm IV q8h (4hr extended infusions) Follow up renal function & cultures, de-escalation of therapy  Peggyann Juba, PharmD, BCPS Pager: [phone] 08/27/2013,8:06 AM

## 2013-08-27 NOTE — Progress Notes (Signed)
Name: Emma Douglas MRN: [id number] DOB: [birth year]    ADMISSION DATE:  08/06/2013 CONSULTATION DATE:  08/14/2013  REFERRING MD :  Dr. Grandville Silos PRIMARY SERVICE: TRH  CHIEF COMPLAINT:  Septic Shock  BRIEF PATIENT DESCRIPTION:  73 year old female with extensive stage lung cancer and COPD presenting with syncope.  Patient in the ED was found to be hypotensive and CXR revealed large left sided white out.  PCCM was consulted for those reasons.  SIGNIFICANT EVENTS / STUDIES:  6/22 syncope 6/23: large vol 1.5 liter left thoracent. Exudate  6/23 left pleural fluid cytology>>> 6/23 duplex neg DVT   LINES / TUBES: L [place] TLC 6/22>>> 6/22 OETT>>>  CULTURES: Blood 6/22>>> Urine 6/22>>> Sputum 6/22>>> Pleural fluid (L) 6/23>>>  ANTIBIOTICS: Vancomycin 6/22>>> Zosyn 6/22>>>   SUBJECTIVE:  Sedated on vent  remains on neo gtt  VITAL SIGNS: Temp:  [97.9 F (36.6 C)-99 F (37.2 C)] 98.6 F (37 C) (06/24 0600) Pulse Rate:  [25-146] 57 (06/24 0600) Resp:  [15-23] 16 (06/24 0600) BP: (45-245)/(26-207) 90/47 mmHg (06/24 0600) SpO2:  [44 %-100 %] 100 % (06/24 0600) Arterial Line BP: (82-91)/(56-62) 91/62 mmHg (06/23 1030) FiO2 (%):  [35 %] 35 % (06/24 0809) Weight:  [52.5 kg (115 lb 11.9 oz)] 52.5 kg (115 lb 11.9 oz) (06/24 0500) HEMODYNAMICS:   VENTILATOR SETTINGS: Vent Mode:  [-] PRVC FiO2 (%):  [35 %] 35 % Set Rate:  [16 bmp] 16 bmp Vt Set:  [420 mL] 420 mL PEEP:  [5 cmH20] 5 cmH20 Plateau Pressure:  [23 cmH20-26 cmH20] 24 cmH20 INTAKE / OUTPUT: Intake/Output     06/23 0701 - 06/24 0700 06/24 0701 - 06/25 0700   I.V. (mL/kg) 3646.8 (69.5)    NG/GT 470    IV Piggyback 1200    Total Intake(mL/kg) 5316.8 (101.3)    Urine (mL/kg/hr) 1605 (1.3)    Total Output 1605     Net +3711.8            PHYSICAL EXAMINATION: General:  Chronically ill appearing elderly female. Neuro:  Sedated on vent  HEENT:  [place]/AT, PERRL, orally intubated  Cardiovascular:  RRR, Nl  S1/S2, -M/R/G. Lungs:scattered rhonchi, decreased on left  Abdomen:  Soft, NT, ND and +BS. Musculoskeletal:  -edema and -tenderness. Skin:  Intact.  LABS:  CBC  Recent Labs Lab 08/27/2013 1913 08/26/13 0445 08/27/13 0530  WBC 14.6* 14.3* 17.0*  HGB 11.3* 10.6* 10.8*  HCT 35.2* 33.5* 34.4*  PLT 508* 464* 433*   Coag's  Recent Labs Lab 08/30/2013 1913  APTT 32  INR 1.05   BMET  Recent Labs Lab 08/22/2013 1350 08/20/2013 1404 08/30/2013 1914 08/26/13 0445 08/27/13 0530  NA 140 137  --  136* 136*  K 4.2 4.0  --  4.3 3.5*  CL 93* 97  --  100 101  CO2 23  --   --  20 20  BUN 89* 97*  --  70* 56*  CREATININE 4.02* 4.50* 3.26* 2.75* 2.00*  GLUCOSE 119* 119*  --  189* 171*   Electrolytes  Recent Labs Lab 08/24/2013 1129 08/22/2013 1350 08/24/2013 1909 08/26/13 0445 08/27/13 0530  CALCIUM 10.7* 9.6  --  8.0* 8.2*  MG 2.6*  --  2.4  --   --   PHOS 7.1*  --   --   --   --    Sepsis Markers  Recent Labs Lab 09/01/2013 1404 08/11/2013 1909 08/26/13 0445 08/27/13 0530  LATICACIDVEN 4.85*  --  3.0*  --  PROCALCITON  --  1.05 0.86 0.30   ABG  Recent Labs Lab 08/26/13 0040 08/27/13 0450  PHART 7.302* 7.244*  PCO2ART 41.4 45.5*  PO2ART 350.0* 76.9*   Liver Enzymes  Recent Labs Lab 08/26/2013 1350 08/26/13 0445 08/27/13 0530  AST 35 51* 73*  ALT 16 32 80*  ALKPHOS 106 164* 122*  BILITOT <0.2* <0.2* <0.2*  ALBUMIN 2.1* 1.6* 1.5*   Cardiac Enzymes  Recent Labs Lab 08/12/2013 1914 08/26/13 0050 08/26/13 0700  TROPONINI <0.30 <0.30 <0.30   Glucose  Recent Labs Lab 08/26/13 0737 08/27/13 0734  GLUCAP 170* 195*    Imaging Ct Head Wo Contrast  09/01/2013   CLINICAL DATA:  Syncope and fall.  EXAM: CT HEAD WITHOUT CONTRAST  TECHNIQUE: Contiguous axial images were obtained from the base of the skull through the vertex without intravenous contrast.  COMPARISON:  Brain MRI 08/08/2013  FINDINGS: There is no evidence of acute cortical infarct, intracranial  hemorrhage, mass, or midline shift. 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 Patchy hypodensities in the periventricular white matter are compatible with mild chronic small vessel ischemic disease. There is mild cerebral atrophy.  Orbits are unremarkable. Mastoid air cells are clear. Left maxillary sinus mucous retention cyst is partially visualized.  IMPRESSION: 1. No evidence of acute intracranial abnormality. 2. Unchanged, chronic small bifrontal subdural hematomas.   Electronically Signed   By: Logan Bores   On: 08/05/2013 14:42   US Renal Port  08/26/2013   CLINICAL DATA:  Acute renal failure.  EXAM: RENAL/URINARY TRACT ULTRASOUND COMPLETE  COMPARISON:  PET-CT 08/08/2013  FINDINGS: Right Kidney:  Length: 9.2 cm. Diffusely increased renal parenchymal echotexture consistent with medical renal disease. No significant parenchymal atrophy. No hydronephrosis.  Left Kidney:  Length: 10.1 cm. Diffusely increased parenchymal echotexture consistent with medical renal disease. No significant parenchymal atrophy. No hydronephrosis.  Bladder:  Bladder is not visualized due to decompression via Foley catheter.  Incidental note of free fluid in the abdomen suggesting ascites.  IMPRESSION: Echogenic renal parenchymal pattern suggesting chronic medical renal disease. No acute hydronephrosis. Abdominal ascites.   Electronically Signed   By: Lucienne Capers M.D.   On: 08/26/2013 06:01   Dg Chest Port 1 View  08/27/2013   EVALUATE ENDOTRACHEAL TUBE CLINICAL DATA: Portable chest x-ray of 08/26/2013  EXAM: PORTABLE CHEST - 1 VIEW  COMPARISON:  Portable chest x-ray of 08/26/2013  FINDINGS: There is more airspace disease now present somewhat asymmetrically, particularly within the left upper mid lung and the right lung base. Due to the asymmetry, pneumonia is the more likely possibility with asymmetrical edema less likely. There does appear to be a left pleural effusion present. The tip of the endotracheal  tube is at the carina and could be withdrawn 2-2.5 cm. NG tube extends below the hemidiaphragm. Degenerative changes are noted in the shoulders.  IMPRESSION: 1. Worsening of airspace disease somewhat asymmetrically. Favor pneumonia over asymmetrical edema. 2. Moderate size left pleural effusion remains.   Electronically Signed   By: Ivar Drape M.D.   On: 08/27/2013 07:56   Dg Chest Port 1 View  08/26/2013   CLINICAL DATA:  Post thoracentesis  EXAM: PORTABLE CHEST - 1 VIEW  COMPARISON:  08/30/2013 ; 08/16/2013  FINDINGS: Interval decrease in persistent small to moderate size left-sided effusion post thoracentesis. No pneumothorax. Stable position of support apparatus.  There is persistent partial obscuration of the left heart border. Improved aeration of left upper lung with persistent left mid and lower lung heterogeneous/consolidative  opacities. Lung hyperexpansion is suspected within the contralateral right lung. No new focal airspace opacities. No evidence of edema. Grossly unchanged bones.  IMPRESSION: 1. Interval decrease in persistent small to moderate size left-sided effusion post thoracentesis. No pneumothorax. 2. Stable positioning of support apparatus.   Electronically Signed   By: Sandi Mariscal M.D.   On: 08/26/2013 12:51   Portable Chest Xray  08/12/2013   CLINICAL DATA:  Endotracheal tube placement.  EXAM: PORTABLE CHEST - 1 VIEW  COMPARISON:  08/15/2013  FINDINGS: Interval placement of endotracheal tube with tip measuring 3.9 cm above the carina. Previous left-sided central vascular catheter has been removed. Persistence of white out of the left hemi thorax without significant volume loss. Probable emphysematous changes and fibrosis in the right lung.  IMPRESSION: Endotracheal tube placed with tip measuring 3.9 cm above the carina. Persistent diffuse opacification of the left pneumothorax.   Electronically Signed   By: Lucienne Capers M.D.   On: 08/04/2013 22:55   Dg Chest Port 1  View  08/22/2013   CLINICAL DATA:  Evaluate central line placement.  EXAM: PORTABLE CHEST - 1 VIEW  COMPARISON:  Chest x-ray 08/23/2013  FINDINGS: New left subclavian central catheter, tip of which never crosses the midline and extends inferiorly parallel to the thoracic spine on the left side, concerning for arterial placement with tip likely in the thoracic aorta. The patient appears to be properly centered, making projectional distortion and misperception of this finding less likely. Complete opacification of the left hemithorax redemonstrated, which may reflect a massive left pleural effusion, extensive left lung atelectasis and/or extensive airspace consolidation in the left lung, similar to the prior study. Irregular contour of the right hilum and right mediastinal border, compatible with lymphadenopathy as demonstrated by prior a chest CT 07/30/2013. There may be some slight shift of cardiomediastinal structures toward the right, which could relate to mass effect from the large left pleural fluid collection. No consolidative airspace disease in the right lung. No right pleural effusion. Heart and left mediastinal borders are obscured.  IMPRESSION: 1. Findings are concerning for potential arterial placement of new left subclavian catheter. Clinical correlation is recommended, as the tip of the catheter is favored to be within the thoracic aorta radiographically. 2. Otherwise, the appearance the chest is very similar to the recent prior study, however there may be some shift of cardiomediastinal structures toward the right side related to enlarging left-sided pleural fluid collection. These results were called by telephone at the time of interpretation on 08/24/2013 at 5:34 PM to Dr. Irine Seal , who verbally acknowledged these results.   Electronically Signed   By: Vinnie Langton M.D.   On: 08/10/2013 17:38   Dg Chest Portable 1 View  08/13/2013   CLINICAL DATA:  NEAR SYNCOPE  EXAM: PORTABLE CHEST -  1 VIEW  COMPARISON:  Prior radiograph from 08/16/2013  FINDINGS: Cardiac and mediastinal silhouettes are largely obscured. Bulky right hilar adenopathy is grossly similar.  There is essentially complete opacification of the left hemi thorax, likely related to postobstructive consolidation from known left lower lobe lung mass. A component of pleural effusion may also be present.  Aerated right lung is grossly clear. Known right upper lobe mass is grossly stable. No pneumothorax.  No acute osseous abnormality.  Prominent degenerative changes noted about the shoulders bilaterally.  IMPRESSION: 1. Complete opacification of the left hemi thorax, likely related to in large part postobstructive consolidation from known left lower lobe lung mass. A component of pleural  effusion may also be present. 2. Grossly stable bulky hilar adenopathy.   Electronically Signed   By: Jeannine Boga M.D.   On: 08/16/2013 14:28    CXR: L side effusion persists. Aeration a little worse bilaterally   ASSESSMENT / PLAN:  PULMONARY A: Acute respiratory failure Lung cancer-->suspect cancer burden has progressed  Post-obstructive PNA large left sided exudative effusion-->concerned that this may be malignant effusion  Aeration a little worse 6/24 P:   - full vent support -f/u pleural fluid path  -f/u serial abg and CXR -see ID section  -diurese when BP will allow    CARDIOVASCULAR A: Septic shock. Has persistent hypotension. Not sure how much of this is meds  P:  -MAP >65 -CVP 8-12 -volume and pressors as indicated  -see ID section   RENAL A: AKI-->responding to resuscitation efforts  Mild hypernatremia  hypokalemia P:   -avoid hypotension -keep even volume status at this point -cont MIVF w/ D51/2  GASTROINTESTINAL A: Severe malnutrition from end stage cancer.. P:   - Will need EOL discussion. - cont TFs   HEMATOLOGIC A:  Leukocytosis P:  - Daily CBC. - Treat infection.  INFECTIOUS A:  L  PNA. P:   - f/u Pan culture. - cont Vanc/zosyn.  ENDOCRINE A:  Hyperglycemia  P:   ssi  NEUROLOGIC A: acute encephalopathy  P:   -RASS goal -2 - cont precedex, try to taper fent down -supportive care   TODAY'S SUMMARY:  Post obst PNA/effusion and septic shock s/p thora 1.5 L removal. She is DNR. We will aim to optimize pulmonary status in effort to extubate. I am not sure if palliative XRT is an option.   I have personally obtained a history, examined the patient, evaluated laboratory and imaging results, formulated the assessment and plan and placed orders. CRITICAL CARE: The patient is critically ill with multiple organ systems failure and requires high complexity decision making for assessment and support, frequent evaluation and titration of therapies, application of advanced monitoring technologies and extensive interpretation of multiple databases. Critical Care Time devoted to patient care services described in this note is 45 minutes.   Kara Mead MD. [place]. [place] [facility] Pager [phone] If no response call [phone]    08/27/2013, 8:40 AM

## 2013-08-27 NOTE — Progress Notes (Signed)
Received referral from [facility] program for [facility] services. However, patient on vent currently. Will engage if and when appropriate. [facility] [phone]

## 2013-08-27 NOTE — Progress Notes (Signed)
eLink Physician-Brief Progress Note Patient Name: Emma Douglas DOB: [birth year] MRN: [id number]  Date of Service  08/27/2013   HPI/Events of Note   contniued systemic deterioration  eICU Interventions  Get cbc, trop, bmet, lactate, ctrop, inr, lft to help narrow down acute prognosis   Intervention Category Major Interventions: Acid-Base disturbance - evaluation and management;Acute renal failure - evaluation and management;Shock - evaluation and management;Sepsis - evaluation and management  RAMASWAMY,MURALI 08/27/2013, 5:22 PM

## 2013-08-28 ENCOUNTER — Inpatient Hospital Stay (HOSPITAL_COMMUNITY): Payer: Medicare Other

## 2013-08-28 ENCOUNTER — Ambulatory Visit: Payer: Medicare Other

## 2013-08-28 LAB — BLOOD GAS, ARTERIAL
ACID-BASE DEFICIT: 4.9 mmol/L — AB (ref 0.0–2.0)
BICARBONATE: 21.2 meq/L (ref 20.0–24.0)
DRAWN BY: 308601
FIO2: 0.5 %
O2 SAT: 96.4 %
PCO2 ART: 46.6 mmHg — AB (ref 35.0–45.0)
PEEP/CPAP: 8 cmH2O
PO2 ART: 88.4 mmHg (ref 80.0–100.0)
PRESSURE CONTROL: 22 cmH2O
Patient temperature: 98.6
RATE: 16 resp/min
TCO2: 20.2 mmol/L (ref 0–100)
pH, Arterial: 7.28 — ABNORMAL LOW (ref 7.350–7.450)

## 2013-08-28 LAB — CBC
HEMATOCRIT: 30.8 % — AB (ref 36.0–46.0)
Hemoglobin: 9.9 g/dL — ABNORMAL LOW (ref 12.0–15.0)
MCH: 31 pg (ref 26.0–34.0)
MCHC: 32.1 g/dL (ref 30.0–36.0)
MCV: 96.6 fL (ref 78.0–100.0)
Platelets: 352 10*3/uL (ref 150–400)
RBC: 3.19 MIL/uL — AB (ref 3.87–5.11)
RDW: 14.4 % (ref 11.5–15.5)
WBC: 17.2 10*3/uL — AB (ref 4.0–10.5)

## 2013-08-28 LAB — COMPREHENSIVE METABOLIC PANEL
ALK PHOS: 107 U/L (ref 39–117)
ALT: 63 U/L — ABNORMAL HIGH (ref 0–35)
AST: 46 U/L — ABNORMAL HIGH (ref 0–37)
Albumin: 1.4 g/dL — ABNORMAL LOW (ref 3.5–5.2)
BUN: 41 mg/dL — AB (ref 6–23)
CHLORIDE: 95 meq/L — AB (ref 96–112)
CO2: 22 meq/L (ref 19–32)
CREATININE: 1.49 mg/dL — AB (ref 0.50–1.10)
Calcium: 7.8 mg/dL — ABNORMAL LOW (ref 8.4–10.5)
GFR calc Af Amer: 39 mL/min — ABNORMAL LOW (ref >90)
GFR, EST NON AFRICAN AMERICAN: 33 mL/min — AB (ref >90)
Glucose, Bld: 265 mg/dL — ABNORMAL HIGH (ref 70–99)
POTASSIUM: 3.7 meq/L (ref 3.7–5.3)
Sodium: 132 mEq/L — ABNORMAL LOW (ref 137–147)
Total Bilirubin: <0.2 mg/dL — ABNORMAL LOW (ref 0.3–1.2)
Total Protein: 5.2 g/dL — ABNORMAL LOW (ref 6.0–8.3)

## 2013-08-28 LAB — GLUCOSE, CAPILLARY: Glucose-Capillary: 264 mg/dL — ABNORMAL HIGH (ref 70–99)

## 2013-08-28 MED ORDER — DEXTROSE 5 % IV SOLN
1.0000 mg/h | INTRAVENOUS | Status: DC
  Administered 2013-08-28: 10 mg/h via INTRAVENOUS
  Filled 2013-08-28: qty 10

## 2013-08-28 MED ORDER — AMIODARONE HCL IN DEXTROSE 360-4.14 MG/200ML-% IV SOLN
60.0000 mg/h | INTRAVENOUS | Status: DC
  Administered 2013-08-28: 60 mg/h via INTRAVENOUS
  Filled 2013-08-28: qty 200

## 2013-08-28 MED ORDER — MORPHINE BOLUS VIA INFUSION
5.0000 mg | INTRAVENOUS | Status: DC | PRN
  Filled 2013-08-28: qty 20

## 2013-08-28 MED ORDER — AMIODARONE LOAD VIA INFUSION: 150.0000 mg | Freq: Once | INTRAVENOUS | Status: DC

## 2013-08-28 MED ORDER — AMIODARONE HCL IN DEXTROSE 360-4.14 MG/200ML-% IV SOLN: 30.0000 mg/h | INTRAVENOUS | Status: DC

## 2013-08-28 MED ORDER — MIDAZOLAM HCL 2 MG/2ML IJ SOLN
1.0000 mg | Freq: Once | INTRAMUSCULAR | Status: AC
  Administered 2013-08-28: 1 mg via INTRAVENOUS

## 2013-08-28 MED ORDER — AMIODARONE IV BOLUS ONLY 150 MG/100ML
150.0000 mg | Freq: Once | INTRAVENOUS | Status: AC
  Administered 2013-08-28: 150 mg via INTRAVENOUS
  Filled 2013-08-28: qty 100

## 2013-08-29 ENCOUNTER — Ambulatory Visit: Payer: Medicare Other

## 2013-08-30 LAB — BODY FLUID CULTURE
CULTURE: NO GROWTH
Gram Stain: NONE SEEN
SPECIAL REQUESTS: NORMAL

## 2013-08-31 LAB — CULTURE, BLOOD (ROUTINE X 2)
Culture: NO GROWTH
Culture: NO GROWTH

## 2013-09-01 ENCOUNTER — Other Ambulatory Visit: Payer: Medicare Other

## 2013-09-01 ENCOUNTER — Inpatient Hospital Stay: Payer: Medicare Other | Admitting: Pulmonary Disease

## 2013-09-03 NOTE — Progress Notes (Signed)
  Amiodarone Drug - Drug Interaction Consult Note  Recommendations:  Amiodarone is metabolized by the cytochrome P450 system and therefore has the potential to cause many drug interactions. Amiodarone has an average plasma half-life of 50 days (range 20 to 100 days).   There is potential for drug interactions to occur several weeks or months after stopping treatment and the onset of drug interactions may be slow after initiating amiodarone.   [x]  Statins: Increased risk of myopathy. Simvastatin- restrict dose to 20mg  daily. Other statins: counsel patients to report any muscle pain or weakness immediately.  []  Anticoagulants: Amiodarone can increase anticoagulant effect. Consider warfarin dose reduction. Patients should be monitored closely and the dose of anticoagulant altered accordingly, remembering that amiodarone levels take several weeks to stabilize.  []  Antiepileptics: Amiodarone can increase plasma concentration of phenytoin, the dose should be reduced. Note that small changes in phenytoin dose can result in large changes in levels. Monitor patient and counsel on signs of toxicity.  [x]  Beta blockers: increased risk of bradycardia, AV block and myocardial depression. Sotalol - avoid concomitant use.  []   Calcium channel blockers (diltiazem and verapamil): increased risk of bradycardia, AV block and myocardial depression.  []   Cyclosporine: Amiodarone increases levels of cyclosporine. Reduced dose of cyclosporine is recommended.  []  Digoxin dose should be halved when amiodarone is started.  []  Diuretics: increased risk of cardiotoxicity if hypokalemia occurs.  []  Oral hypoglycemic agents (glyburide, glipizide, glimepiride): increased risk of hypoglycemia. Patient's glucose levels should be monitored closely when initiating amiodarone therapy.   []  Drugs that prolong the QT interval:  Torsades de pointes risk may be increased with concurrent use - avoid if possible.  Monitor QTc,  also keep magnesium/potassium WNL if concurrent therapy can't be avoided. Marland Kitchen Antibiotics: e.g. fluoroquinolones, erythromycin. . Antiarrhythmics: e.g. quinidine, procainamide, disopyramide, sotalol. . Antipsychotics: e.g. phenothiazines, haloperidol.  . Lithium, tricyclic antidepressants, and methadone. Thank You,  Nani Skillern Crowford  09/17/2013 6:59 AM

## 2013-09-03 NOTE — Progress Notes (Signed)
Clinical Social Work Department BRIEF PSYCHOSOCIAL ASSESSMENT 09/20/13  Patient:  Emma Douglas, Emma Douglas     Account Number:  [id number]     Admit date:  08/18/2013  Clinical Social Worker:  Ulyess Blossom  Date/Time:  20-Sep-2013 11:20 AM  Referred by:  Physician  Date Referred:  09-20-2013 Referred for  Other - See comment   Other Referral:   comfort measures   Interview type:  Family Other interview type:    PSYCHOSOCIAL DATA Living Status:  ALONE Admitted from facility:   Level of care:   Primary support name:  Olin Hauser Gregory/daughter/[phone] Primary support relationship to patient:  CHILD, ADULT Degree of support available:   strong    CURRENT CONCERNS Current Concerns  Other - See comment   Other Concerns:   comfort measures    SOCIAL WORK ASSESSMENT / PLAN CSW received referral for comfort measures.    CSW reviewed chart and noted that pt extubated today to comfort measures.    CSW met with pt daughter, Jeannene Patella at bedside. CSW introduced self and explained role. CSW provided emotional support to pt daughter as pt daughter shared stories of her mother and fond memories of quality time pt daughter and pt spent together. Pt daughter shared that she is an only child and pt daughter has a strong connection with pt. Pt daughter became tearful during conversation and CSW offered support. Pt daughter also smiled while discussing memories. CSW ensured pt daughter that pt aware of pt daughter presence and support. Pt daughter appreciative of CSW visit and time.    CSW notified pt daughter that she can notify pt RN at any time to contact CSW for ongoing support.    CSW to continue to follow.   Assessment/plan status:  Psychosocial Support/Ongoing Assessment of Needs Other assessment/ plan:   Information/referral to community resources:   spiritual care also following, other referrals inappropriate at this time    PATIENT'S/FAMILY'S RESPONSE TO PLAN OF CARE: Pt appears  comfortable during visit and unable to participate. Pt daughter coping as well as possible at this time, but reports that her mother is her main family support. Pt daughter positively reflected on memories with her mother and appreciative of support provided.   Alison Murray, MSW, [facility] [phone]

## 2013-09-03 NOTE — Progress Notes (Signed)
Discussed with Dr. Halford Chessman in [place] pt status.  Heart rate 140-150s, increased work of breathing and deceased sats.  Vent changes made per RT and orders given for extra one time dose of Versed 1mg .  Will continue to assess.

## 2013-09-03 NOTE — Progress Notes (Signed)
eLink Physician-Brief Progress Note Patient Name: Emma Douglas DOB: 1940/09/17 MRN: [id number]  Date of Service  September 13, 2013   HPI/Events of Note   Pt with progressive acidosis, hypotension, increased WOB, tachycardia.  Marland Kitchen  eICU Interventions  Already on pressors, HCO3 gtt.  Changed to pressure control mode of ventilation.  Might need to d/w family about goals of care given finding of malignant pleural effusion and unlikely to be able to tolerate additional chemotherapy in current condition.    Intervention Category Major Interventions: Other:  SOOD,VINEET 13-Sep-2013, 1:03 AM

## 2013-09-03 NOTE — Progress Notes (Signed)
Noted patient to have palliative care measures/comfort. THN not appropriate at this time.  Marthenia Rolling, MSN- [facility] [phone]

## 2013-09-03 NOTE — Procedures (Signed)
Extubation Procedure Note  Patient Details:   Name: Emma Douglas DOB: 1941/02/21 MRN: [id number]   Airway Documentation:     Evaluation  O2 sats: 94 Complications: none Patient tolerated procedure well. Bilateral Breath Sounds: Rhonchi Suctioning: Oral;Airway Pt not able to speak  Per MD order pt extubated, placed on nasal 3L cannula.    Martha Clan September 13, 2013, 9:34 AM

## 2013-09-03 NOTE — Progress Notes (Addendum)
Name: Lysette Lindenbaum MRN: [id number] DOB: [birth year]    ADMISSION DATE:  08/10/2013 CONSULTATION DATE:  08/31/2013  REFERRING MD :  Dr. Grandville Silos PRIMARY SERVICE: TRH  CHIEF COMPLAINT:  Septic Shock  BRIEF PATIENT DESCRIPTION:  73 year old female with extensive stage lung cancer and COPD presenting with syncope.  Patient in the ED was found to be hypotensive and CXR revealed large left sided white out.  PCCM was consulted for those reasons.  SIGNIFICANT EVENTS / STUDIES:  6/22 syncope 6/23: large vol 1.5 liter left thoracent. 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 Will extubate and provide palliative care. Made full DNR and comfort    LINES / TUBES: L [place] TLC 6/22>>> 6/22 OETT>>>  CULTURES: Blood 6/22>>> Urine 6/22>>>70K mult orgs Sputum 6/22>>> Pleural fluid (L) 6/23>>>  ANTIBIOTICS: Vancomycin 6/22>>>6/25 Zosyn 6/22>>>6/25   SUBJECTIVE:  Sedated on vent  Looks worse   VITAL SIGNS: Temp:  [98.2 F (36.8 C)-100.8 F (38.2 C)] 99.7 F (37.6 C) (06/25 0645) Pulse Rate:  [25-156] 65 (06/25 0645) Resp:  [11-35] 16 (06/25 0645) BP: (49-162)/(31-116) 118/78 mmHg (06/25 0645) SpO2:  [82 %-100 %] 96 % (06/25 0645) FiO2 (%):  [35 %-50 %] 40 % (06/25 0739) Weight:  [55.8 kg (123 lb 0.3 oz)] 55.8 kg (123 lb 0.3 oz) (06/25 0530) HEMODYNAMICS:   VENTILATOR SETTINGS: Vent Mode:  [-] PCV FiO2 (%):  [35 %-50 %] 40 % Set Rate:  [16 bmp] 16 bmp Vt Set:  [420 mL] 420 mL PEEP:  [5 cmH20-8 cmH20] 8 cmH20 Plateau Pressure:  [13 cmH20-33 cmH20] 33 cmH20 INTAKE / OUTPUT: Intake/Output     06/24 0701 - 06/25 0700 06/25 0701 - 06/26 0700   I.V. (mL/kg) 5532.9 (99.2)    NG/GT 980    IV Piggyback 250    Total Intake(mL/kg) 6762.9 (121.2)    Urine (mL/kg/hr) 2075 (1.5)    Total Output 2075     Net +4687.9            PHYSICAL EXAMINATION: General:  Chronically  ill appearing elderly female. Neuro:  Sedated on vent  HEENT:  [place]/AT, PERRL, orally intubated  Cardiovascular:  RRR, Nl S1/S2, -M/R/G. Lungs:scattered rhonchi, decreased on left w/ almost no air movement Abdomen:  Soft, NT, ND and +BS. Musculoskeletal:  -edema and -tenderness. Skin:  Intact.  LABS:  CBC  Recent Labs Lab 08/27/13 0530 08/27/13 1820 17-Sep-2013 0550  WBC 17.0* 14.0* 17.2*  HGB 10.8* 10.4* 9.9*  HCT 34.4* 32.8* 30.8*  PLT 433* 373 352    Recent Labs Lab 08/27/13 0530 08/27/13 1820 2013-09-17 0550  NA 136* 132* 132*  K 3.5* 4.6 3.7  CL 101 98 95*  CO2 20 18* 22  BUN 56* 51* 41*  CREATININE 2.00* 2.03* 1.49*  GLUCOSE 171* 144* 265*   Imaging Dg Chest Port 1 View  09-17-2013   CLINICAL DATA:  Endotracheal tube position.  EXAM: PORTABLE CHEST - 1 VIEW  COMPARISON:  August 27, 2013.  FINDINGS: Stable cardiomegaly. Endotracheal tube is in grossly good position with distal tip 2.5 cm above the carina. Nasogastric tube is in grossly good position. Moderate to large left pleural effusion is noted, with stable airspace opacity seen in the left upper lobe consistent with edema or pneumonia. Increased right upper lobe opacity is noted concerning for worsening pneumonia or edema. No pneumothorax is noted.  IMPRESSION: Endotracheal tube  is in grossly good position. Stable moderate to large left pleural effusion with left upper lobe airspace disease consistent with pneumonia or edema. Increased right upper lobe opacity is noted concerning for worsening edema or pneumonia.   Electronically Signed   By: Sabino Dick M.D.   On: 08/20/2013 07:20   Dg Chest Port 1 View  08/27/2013   EVALUATE ENDOTRACHEAL TUBE CLINICAL DATA: Portable chest x-ray of 08/26/2013  EXAM: PORTABLE CHEST - 1 VIEW  COMPARISON:  Portable chest x-ray of 08/26/2013  FINDINGS: There is more airspace disease now present somewhat asymmetrically, particularly within the left upper mid lung and the right lung base. Due  to the asymmetry, pneumonia is the more likely possibility with asymmetrical edema less likely. There does appear to be a left pleural effusion present. The tip of the endotracheal tube is at the carina and could be withdrawn 2-2.5 cm. NG tube extends below the hemidiaphragm. Degenerative changes are noted in the shoulders.  IMPRESSION: 1. Worsening of airspace disease somewhat asymmetrically. Favor pneumonia over asymmetrical edema. 2. Moderate size left pleural effusion remains.   Electronically Signed   By: Ivar Drape M.D.   On: 08/27/2013 07:56   Dg Chest Port 1 View  08/26/2013   CLINICAL DATA:  Post thoracentesis  EXAM: PORTABLE CHEST - 1 VIEW  COMPARISON:  08/06/2013 ; 08/16/2013  FINDINGS: Interval decrease in persistent small to moderate size left-sided effusion post thoracentesis. No pneumothorax. Stable position of support apparatus.  There is persistent partial obscuration of the left heart border. Improved aeration of left upper lung with persistent left mid and lower lung heterogeneous/consolidative opacities. Lung hyperexpansion is suspected within the contralateral right lung. No new focal airspace opacities. No evidence of edema. Grossly unchanged bones.  IMPRESSION: 1. Interval decrease in persistent small to moderate size left-sided effusion post thoracentesis. No pneumothorax. 2. Stable positioning of support apparatus.   Electronically Signed   By: Sandi Mariscal M.D.   On: 08/26/2013 12:51    CXR: L side effusion persists. 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 Persistent  hypotension  Atrial fib AKI Mild hypernatremia  hypokalemia Severe malnutrition from end stage cancer. Leukocytosis Hypothyroidism  acute encephalopathy   Discussion No sig improvement. Remains pressor  dependant. Pleural sample + malignant cells. Discussed at length w/ pt's daughter Jeannene Patella. Prognosis for meaningful recovery poor. Do not see her as a candidate for chemo or XRT even if she were to survive this acute illness. She does not want to continue invasive therapies and wants to focus on comfort. Have discussed this w/ heme/onc and they are in support of this.   Plan  Initiate palliative care measures  Morphine gtt  PRN versed  Comfort cart to bedside  D/c all meds that do not contribute directly to comfort   Extubate to n/c after morphine infusion at therapeutic dosing  Continue family support    Care during the described time interval was provided by me and/or other providers on the critical care team.  I have reviewed this patient's available data, including medical history, events of note, physical examination and test results as part of my evaluation  CC time x 80m   Kara Mead MD. FCCP. [facility] Pager [phone] If no response call [phone]    29-Aug-2013, 8:40 AM

## 2013-09-03 NOTE — Progress Notes (Signed)
Pt's daughter was bedside when I arrived. She almost immediately began crying. She asked about her mom's spiritual condition; which prompted me to asked questions and we discussed her mom's origin of faith and her faith history. Pt's daughter said she felt better after our discussion. Pt's daughter said she is only child. During our visit, pt's neighbor's minister stopped by and had prayer.  Will continue to provide comfort spt as needed. Please page [phone] when condition changes in order of [place] to provide emotional spt that I feel may be needed. Ernest Haber Chaplain  2013/09/02 1000  Clinical Encounter Type  Visited With Family

## 2013-09-03 NOTE — Progress Notes (Addendum)
Pt passed shortly b4 I arrived. Daughter was appropriately tearful; talking to mom; thanking mom; holding and rubbing her hand. She expressed how she would never have touched a dead person - until now. Daughter asked a lot of questions about next steps; what to do for a service, etc. The do not have a church home. Gave daughter Spiritual Care office number in case she needs additional support or resources.  Daughter was weeping as husband escorted her out of the room.  Walked daughter w/husband, to car. During visit w/husband and grieving wife, husband lacked attn and support to pt's daughter and discouraged her grieving process. Ernest Haber Chaplain

## 2013-09-03 DEATH — deceased

## 2013-09-10 ENCOUNTER — Other Ambulatory Visit: Payer: Medicare Other

## 2013-09-10 ENCOUNTER — Ambulatory Visit: Payer: Medicare Other

## 2013-09-10 NOTE — Discharge Summary (Signed)
     Name: Emma Douglas MRN: [id number] DOB: Feb 15, 1941    ADMISSION DATE:  08/24/2013 CONSULTATION DATE:  08/10/2013  REFERRING MD :  Dr. Grandville Silos PRIMARY SERVICE: TRH  CHIEF COMPLAINT:  Septic Shock  BRIEF PATIENT DESCRIPTION:  73 year old female with extensive stage small cell  lung cancer and COPD presenting with syncope.  Patient in the ED was found to be hypotensive and CXR revealed large left sided white out.  PCCM was consulted for those reasons.  SIGNIFICANT EVENTS / STUDIES:  6/22 syncope 6/23: large vol 1.5 liter left thoracent. 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 Will extubate and provide palliative care. 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 Persistent  hypotension  Atrial fib AKI Mild hypernatremia  hypokalemia Severe malnutrition from end stage cancer. Leukocytosis Hypothyroidism  acute encephalopathy   Discussion No sig improvement with mechanical ventilation & thoracentesis. Remains pressor dependant. Pleural sample + malignant cells. Discussed at length w/ pt's daughter Jeannene Patella. Prognosis for meaningful recovery poor. Do not see her as a candidate for chemo or XRT even if she were to survive this acute illness. She does not want to continue invasive therapies and wants to focus on comfort. Discussed this w/ heme/onc and they are in support of this.   Cause of death - Extensive stage small cell lung cancer, left lung atelectasis, acute respiratory  failure, COPD   Kara Mead MD. FCCP. [facility] Pager [phone] If no response call [phone]    09/10/2013, 12:48 AM

## 2013-09-11 ENCOUNTER — Ambulatory Visit: Payer: Medicare Other

## 2013-09-12 ENCOUNTER — Ambulatory Visit: Payer: Medicare Other

## 2013-09-13 ENCOUNTER — Ambulatory Visit: Payer: Medicare Other

## 2013-09-16 ENCOUNTER — Telehealth: Payer: Self-pay | Admitting: Medical Oncology

## 2013-09-16 NOTE — Telephone Encounter (Signed)
erronous

## 2013-09-17 ENCOUNTER — Other Ambulatory Visit: Payer: Medicare Other

## 2013-09-24 ENCOUNTER — Other Ambulatory Visit: Payer: Medicare Other

## 2013-10-01 ENCOUNTER — Other Ambulatory Visit: Payer: Medicare Other

## 2013-10-08 ENCOUNTER — Other Ambulatory Visit: Payer: Medicare Other

## 2015-06-05 IMAGING — CT CT HEAD W/O CM
2 of 4 series · 15 of 30 positions shown, 17 images · non-contrast
Comparison: Brain MRI 08/08/2013

CLINICAL DATA: Syncope and fall.

EXAM:
CT HEAD WITHOUT CONTRAST
TECHNIQUE: Contiguous axial images were obtained from the base of the skull
through the vertex without intravenous contrast.

[Series 2: head w/o · axial · non-contrast · 0.43mm/px · z∈[-115,-25]mm · 7 of 24 slices shown, 9 images]
[im 3/24  brain]
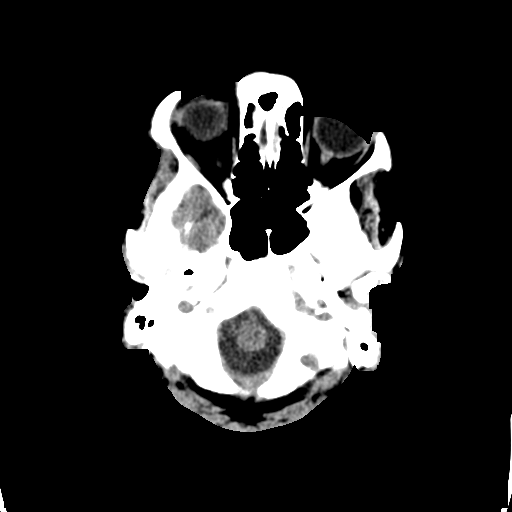
[im 3/24  bone]
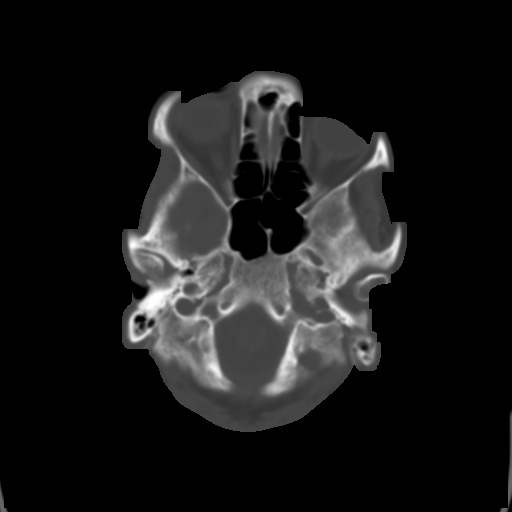
[im 6/24  brain]
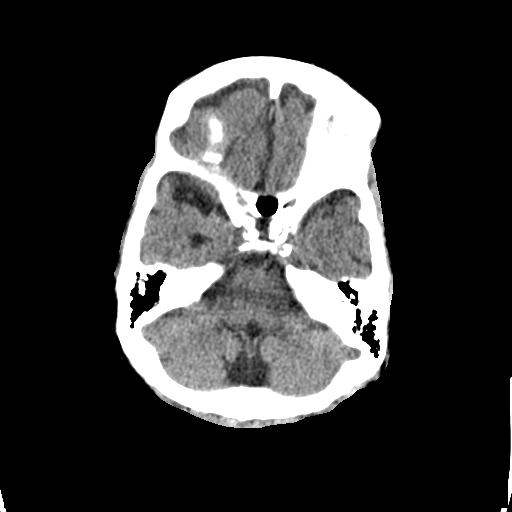
[im 9/24  brain]
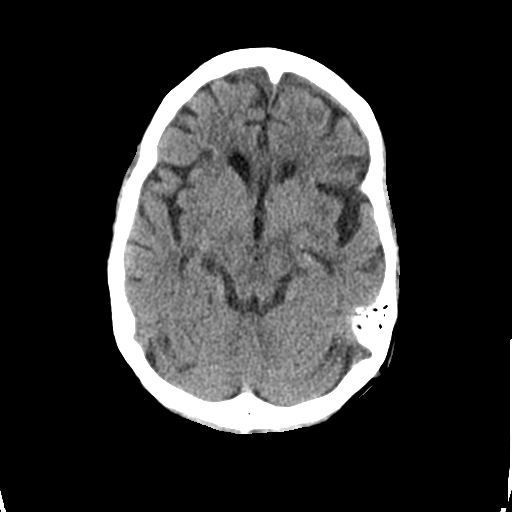
[im 12/24  brain]
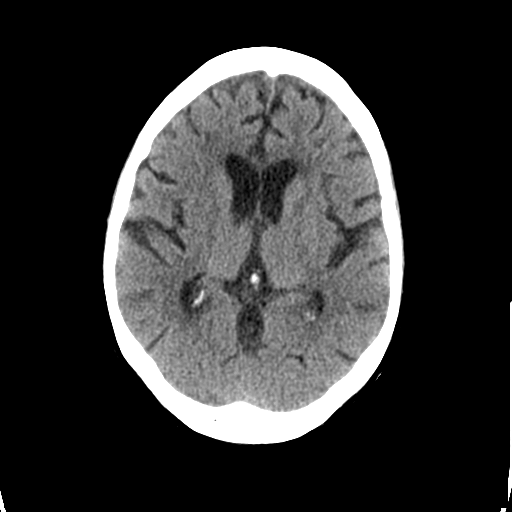
[im 15/24  brain]
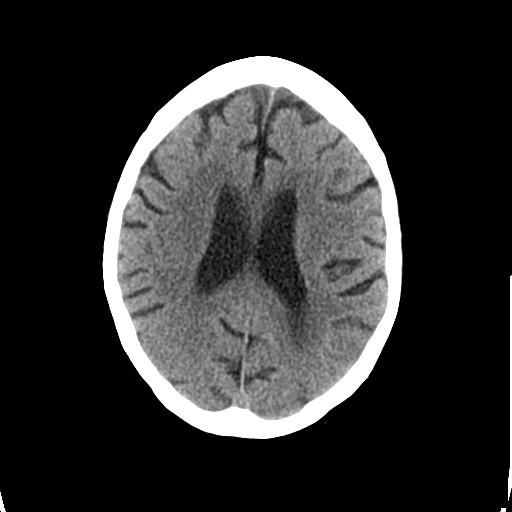
[im 15/24  bone]
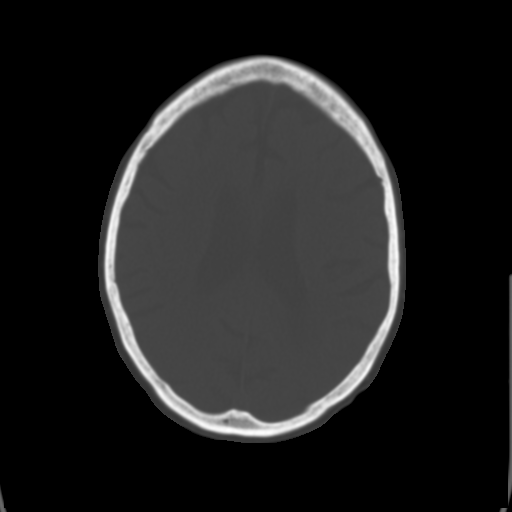
[im 18/24  brain]
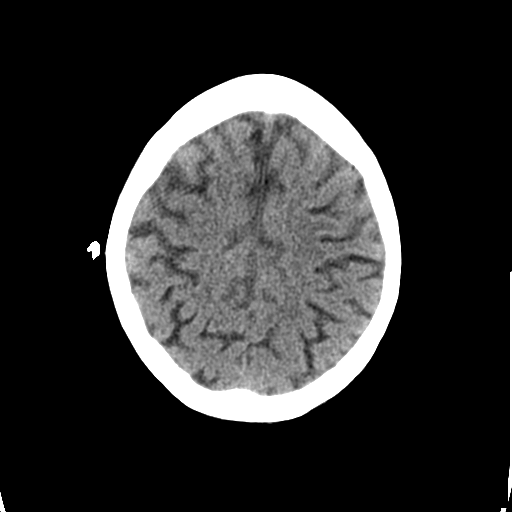
[im 21/24  brain]
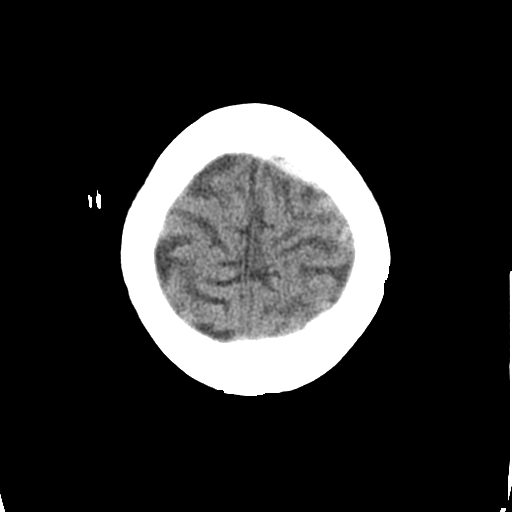

[Series 4: bone windows · axial · 0.43mm/px · z∈[-119,-17]mm · 8 of 40 slices shown]
[im 3/40  bone]
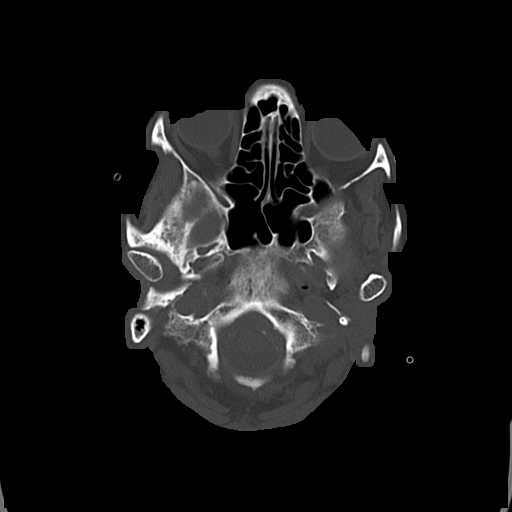
[im 8/40  bone]
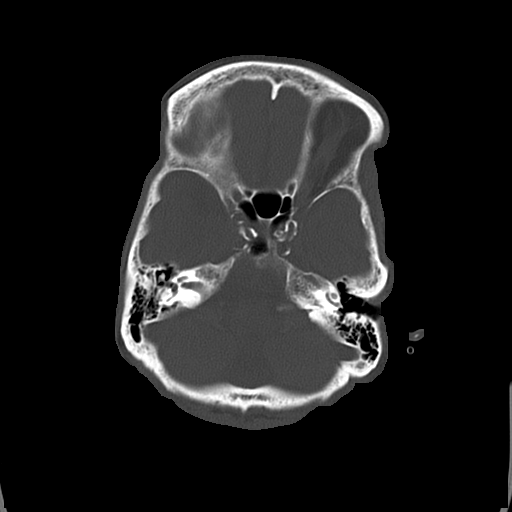
[im 14/40  bone]
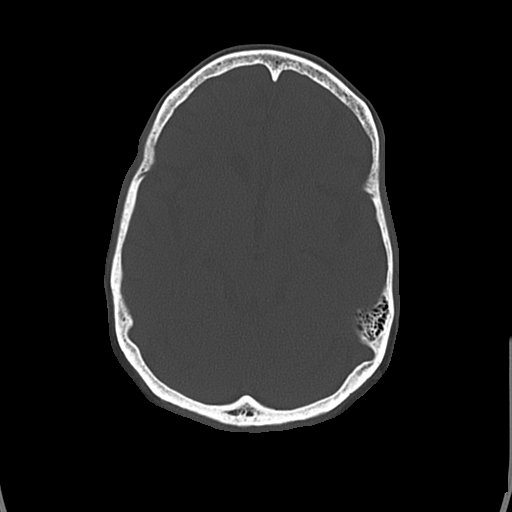
[im 19/40  bone]
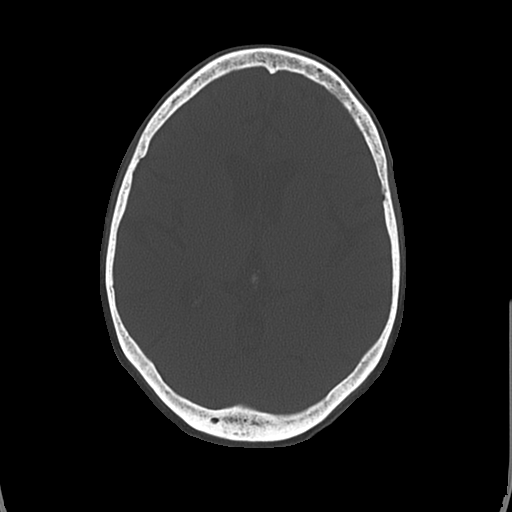
[im 21/40  bone]
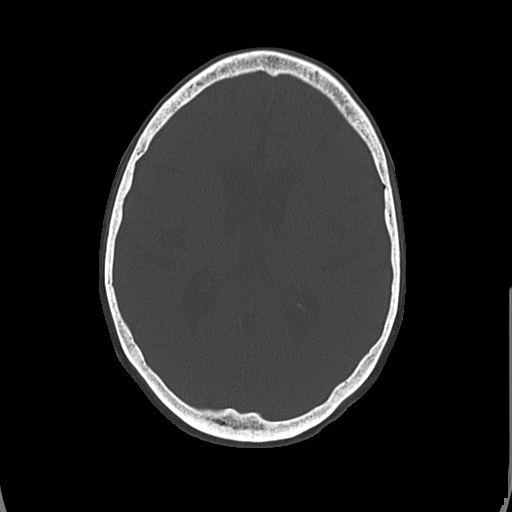
[im 27/40  bone]
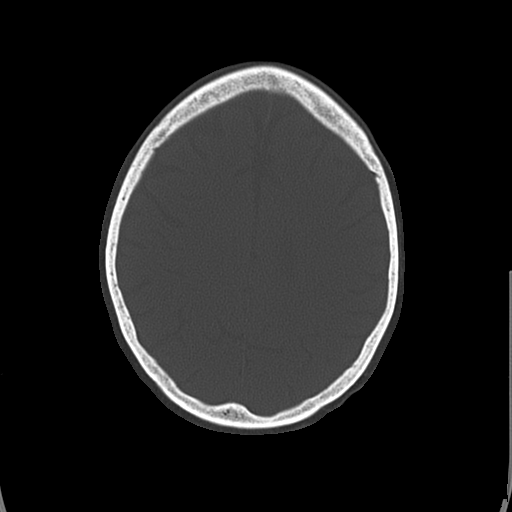
[im 32/40  bone]
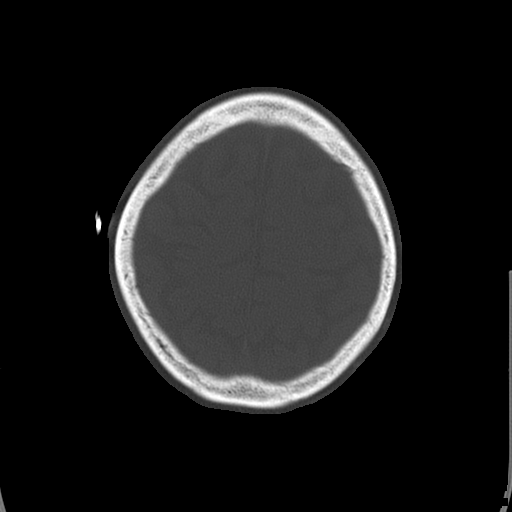
[im 37/40  bone]
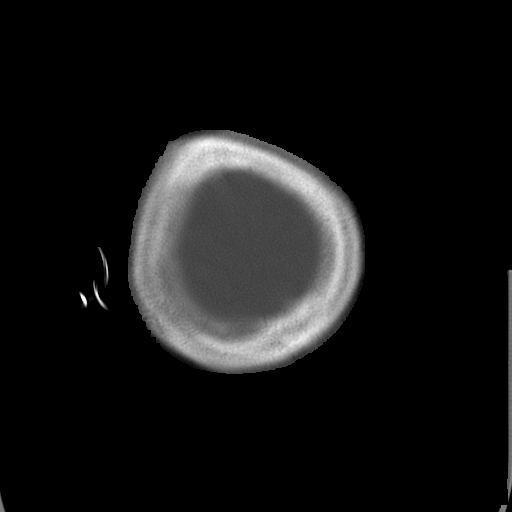

[15 of 30 positions shown; findings below may reference images not displayed]

FINDINGS: There is no evidence of acute cortical infarct, intracranial
hemorrhage, mass, or midline shift. Small bifrontal chronic subdural
hematomas are unchanged. Patchy hypodensities in the periventricular
white matter are compatible with mild chronic small vessel ischemic
disease. There is mild cerebral atrophy.

Orbits are unremarkable. Mastoid air cells are clear. Left maxillary
sinus mucous retention cyst is partially visualized.
IMPRESSION: 1. No evidence of acute intracranial abnormality.
2. Unchanged, chronic small bifrontal subdural hematomas.
